# Patient Record
Sex: Female | Born: 1950
Health system: Southern US, Community
[De-identification: ages and names within clinical notes are randomized; demographics above are authoritative.]

## PROBLEM LIST (undated history)

## (undated) DIAGNOSIS — R011 Cardiac murmur, unspecified: Secondary | ICD-10-CM

## (undated) DIAGNOSIS — F419 Anxiety disorder, unspecified: Secondary | ICD-10-CM

## (undated) DIAGNOSIS — R229 Localized swelling, mass and lump, unspecified: Secondary | ICD-10-CM

## (undated) DIAGNOSIS — F32A Depression, unspecified: Secondary | ICD-10-CM

## (undated) DIAGNOSIS — M1990 Unspecified osteoarthritis, unspecified site: Secondary | ICD-10-CM

## (undated) DIAGNOSIS — R51 Headache: Secondary | ICD-10-CM

## (undated) DIAGNOSIS — J449 Chronic obstructive pulmonary disease, unspecified: Secondary | ICD-10-CM

## (undated) DIAGNOSIS — F329 Major depressive disorder, single episode, unspecified: Secondary | ICD-10-CM

## (undated) DIAGNOSIS — K449 Diaphragmatic hernia without obstruction or gangrene: Secondary | ICD-10-CM

## (undated) DIAGNOSIS — Z8639 Personal history of other endocrine, nutritional and metabolic disease: Secondary | ICD-10-CM

## (undated) DIAGNOSIS — I10 Essential (primary) hypertension: Secondary | ICD-10-CM

## (undated) DIAGNOSIS — H269 Unspecified cataract: Secondary | ICD-10-CM

## (undated) DIAGNOSIS — K5730 Diverticulosis of large intestine without perforation or abscess without bleeding: Secondary | ICD-10-CM

## (undated) DIAGNOSIS — T8859XA Other complications of anesthesia, initial encounter: Secondary | ICD-10-CM

## (undated) DIAGNOSIS — Z87448 Personal history of other diseases of urinary system: Secondary | ICD-10-CM

## (undated) DIAGNOSIS — R519 Headache, unspecified: Secondary | ICD-10-CM

## (undated) DIAGNOSIS — J40 Bronchitis, not specified as acute or chronic: Secondary | ICD-10-CM

## (undated) DIAGNOSIS — T4145XA Adverse effect of unspecified anesthetic, initial encounter: Secondary | ICD-10-CM

## (undated) DIAGNOSIS — Z973 Presence of spectacles and contact lenses: Secondary | ICD-10-CM

## (undated) DIAGNOSIS — E785 Hyperlipidemia, unspecified: Secondary | ICD-10-CM

## (undated) DIAGNOSIS — Z972 Presence of dental prosthetic device (complete) (partial): Secondary | ICD-10-CM

## (undated) HISTORY — DX: Bronchitis, not specified as acute or chronic: J40

## (undated) HISTORY — DX: Presence of spectacles and contact lenses: Z97.3

## (undated) HISTORY — DX: Headache: R51

## (undated) HISTORY — DX: Essential (primary) hypertension: I10

## (undated) HISTORY — DX: Major depressive disorder, single episode, unspecified: F32.9

## (undated) HISTORY — PX: LIPOMA EXCISION: SHX5283

## (undated) HISTORY — PX: HAMMER TOE SURGERY: SHX385

## (undated) HISTORY — PX: CATARACT EXTRACTION, BILATERAL: SHX1313

## (undated) HISTORY — PX: BREAST SURGERY: SHX581

## (undated) HISTORY — DX: Diaphragmatic hernia without obstruction or gangrene: K44.9

## (undated) HISTORY — PX: BREAST EXCISIONAL BIOPSY: SUR124

## (undated) HISTORY — PX: BUNIONECTOMY: SHX129

## (undated) HISTORY — PX: THYROID LOBECTOMY: SHX420

## (undated) HISTORY — DX: Unspecified cataract: H26.9

## (undated) HISTORY — DX: Personal history of other diseases of urinary system: Z87.448

## (undated) HISTORY — PX: KNEE ARTHROSCOPY: SUR90

## (undated) HISTORY — DX: Hyperlipidemia, unspecified: E78.5

## (undated) HISTORY — PX: PARTIAL KNEE ARTHROPLASTY: SHX2174

## (undated) HISTORY — DX: Anxiety disorder, unspecified: F41.9

## (undated) HISTORY — PX: HAND SURGERY: SHX662

## (undated) HISTORY — PX: UPPER GASTROINTESTINAL ENDOSCOPY: SHX188

## (undated) HISTORY — DX: Depression, unspecified: F32.A

## (undated) HISTORY — DX: Headache, unspecified: R51.9

---

## 1982-01-12 HISTORY — PX: PARTIAL HYSTERECTOMY: SHX80

## 1998-04-22 ENCOUNTER — Other Ambulatory Visit: Admission: RE | Admit: 1998-04-22 | Discharge: 1998-04-22 | Payer: Self-pay | Admitting: Obstetrics & Gynecology

## 1999-04-23 ENCOUNTER — Encounter: Admission: RE | Admit: 1999-04-23 | Discharge: 1999-04-23 | Payer: Self-pay | Admitting: Obstetrics and Gynecology

## 1999-04-23 ENCOUNTER — Encounter: Payer: Self-pay | Admitting: Obstetrics and Gynecology

## 1999-04-24 ENCOUNTER — Other Ambulatory Visit: Admission: RE | Admit: 1999-04-24 | Discharge: 1999-04-24 | Payer: Self-pay | Admitting: Obstetrics and Gynecology

## 2000-01-30 ENCOUNTER — Other Ambulatory Visit: Admission: RE | Admit: 2000-01-30 | Discharge: 2000-01-30 | Payer: Self-pay | Admitting: Family Medicine

## 2000-04-22 ENCOUNTER — Encounter: Payer: Self-pay | Admitting: Family Medicine

## 2000-04-22 ENCOUNTER — Encounter: Admission: RE | Admit: 2000-04-22 | Discharge: 2000-04-22 | Payer: Self-pay | Admitting: Family Medicine

## 2000-05-13 ENCOUNTER — Other Ambulatory Visit: Admission: RE | Admit: 2000-05-13 | Discharge: 2000-05-13 | Payer: Self-pay | Admitting: Obstetrics and Gynecology

## 2000-05-13 ENCOUNTER — Encounter: Admission: RE | Admit: 2000-05-13 | Discharge: 2000-05-13 | Payer: Self-pay | Admitting: Obstetrics and Gynecology

## 2000-05-13 ENCOUNTER — Encounter: Payer: Self-pay | Admitting: Obstetrics and Gynecology

## 2000-05-18 ENCOUNTER — Encounter: Payer: Self-pay | Admitting: Obstetrics and Gynecology

## 2000-05-18 ENCOUNTER — Encounter: Admission: RE | Admit: 2000-05-18 | Discharge: 2000-05-18 | Payer: Self-pay | Admitting: Family Medicine

## 2000-05-18 ENCOUNTER — Encounter: Admission: RE | Admit: 2000-05-18 | Discharge: 2000-05-18 | Payer: Self-pay | Admitting: Obstetrics and Gynecology

## 2000-05-18 ENCOUNTER — Encounter: Payer: Self-pay | Admitting: Family Medicine

## 2001-05-18 ENCOUNTER — Other Ambulatory Visit: Admission: RE | Admit: 2001-05-18 | Discharge: 2001-05-18 | Payer: Self-pay | Admitting: Obstetrics and Gynecology

## 2001-05-20 ENCOUNTER — Encounter: Admission: RE | Admit: 2001-05-20 | Discharge: 2001-05-20 | Payer: Self-pay | Admitting: Obstetrics and Gynecology

## 2001-05-20 ENCOUNTER — Encounter: Payer: Self-pay | Admitting: Obstetrics and Gynecology

## 2002-03-20 ENCOUNTER — Encounter: Payer: Self-pay | Admitting: Obstetrics and Gynecology

## 2002-03-20 ENCOUNTER — Encounter: Admission: RE | Admit: 2002-03-20 | Discharge: 2002-03-20 | Payer: Self-pay | Admitting: Obstetrics and Gynecology

## 2002-07-07 ENCOUNTER — Encounter: Admission: RE | Admit: 2002-07-07 | Discharge: 2002-07-07 | Payer: Self-pay | Admitting: Obstetrics and Gynecology

## 2002-07-07 ENCOUNTER — Encounter: Payer: Self-pay | Admitting: Obstetrics and Gynecology

## 2002-07-25 ENCOUNTER — Encounter: Payer: Self-pay | Admitting: Family Medicine

## 2002-07-25 ENCOUNTER — Encounter: Admission: RE | Admit: 2002-07-25 | Discharge: 2002-07-25 | Payer: Self-pay | Admitting: Family Medicine

## 2003-07-12 ENCOUNTER — Encounter: Admission: RE | Admit: 2003-07-12 | Discharge: 2003-07-12 | Payer: Self-pay | Admitting: Obstetrics and Gynecology

## 2004-07-14 ENCOUNTER — Ambulatory Visit: Payer: Self-pay | Admitting: Internal Medicine

## 2004-07-14 ENCOUNTER — Encounter: Admission: RE | Admit: 2004-07-14 | Discharge: 2004-07-14 | Payer: Self-pay | Admitting: Obstetrics and Gynecology

## 2004-07-30 ENCOUNTER — Encounter (INDEPENDENT_AMBULATORY_CARE_PROVIDER_SITE_OTHER): Payer: Self-pay | Admitting: *Deleted

## 2004-07-30 ENCOUNTER — Ambulatory Visit: Payer: Self-pay | Admitting: Internal Medicine

## 2004-11-25 ENCOUNTER — Ambulatory Visit (HOSPITAL_COMMUNITY): Admission: RE | Admit: 2004-11-25 | Discharge: 2004-11-25 | Payer: Self-pay | Admitting: Family Medicine

## 2005-01-22 ENCOUNTER — Encounter: Admission: RE | Admit: 2005-01-22 | Discharge: 2005-01-22 | Payer: Self-pay | Admitting: General Surgery

## 2005-09-09 ENCOUNTER — Encounter: Admission: RE | Admit: 2005-09-09 | Discharge: 2005-09-09 | Payer: Self-pay | Admitting: Obstetrics and Gynecology

## 2005-09-16 ENCOUNTER — Encounter: Admission: RE | Admit: 2005-09-16 | Discharge: 2005-09-16 | Payer: Self-pay | Admitting: Obstetrics and Gynecology

## 2005-12-09 ENCOUNTER — Ambulatory Visit: Payer: Self-pay | Admitting: Internal Medicine

## 2006-01-12 HISTORY — PX: ESOPHAGOGASTRODUODENOSCOPY: SHX1529

## 2006-01-26 ENCOUNTER — Ambulatory Visit: Payer: Self-pay | Admitting: Internal Medicine

## 2006-07-15 ENCOUNTER — Encounter: Admission: RE | Admit: 2006-07-15 | Discharge: 2006-07-15 | Payer: Self-pay | Admitting: Family Medicine

## 2006-10-04 ENCOUNTER — Encounter: Admission: RE | Admit: 2006-10-04 | Discharge: 2006-10-04 | Payer: Self-pay | Admitting: Obstetrics and Gynecology

## 2006-10-20 ENCOUNTER — Encounter: Admission: RE | Admit: 2006-10-20 | Discharge: 2006-10-20 | Payer: Self-pay | Admitting: Endocrinology

## 2007-05-18 ENCOUNTER — Telehealth: Payer: Self-pay | Admitting: Internal Medicine

## 2007-05-24 ENCOUNTER — Telehealth (INDEPENDENT_AMBULATORY_CARE_PROVIDER_SITE_OTHER): Payer: Self-pay | Admitting: *Deleted

## 2007-06-08 ENCOUNTER — Encounter: Payer: Self-pay | Admitting: Internal Medicine

## 2007-10-18 ENCOUNTER — Encounter: Admission: RE | Admit: 2007-10-18 | Discharge: 2007-10-18 | Payer: Self-pay | Admitting: Obstetrics and Gynecology

## 2008-11-01 ENCOUNTER — Encounter: Admission: RE | Admit: 2008-11-01 | Discharge: 2008-11-01 | Payer: Self-pay | Admitting: Obstetrics and Gynecology

## 2009-01-12 HISTORY — PX: COLONOSCOPY: SHX174

## 2009-06-24 ENCOUNTER — Encounter (INDEPENDENT_AMBULATORY_CARE_PROVIDER_SITE_OTHER): Payer: Self-pay | Admitting: *Deleted

## 2009-07-25 ENCOUNTER — Encounter (INDEPENDENT_AMBULATORY_CARE_PROVIDER_SITE_OTHER): Payer: Self-pay | Admitting: *Deleted

## 2009-07-30 ENCOUNTER — Ambulatory Visit: Payer: Self-pay | Admitting: Internal Medicine

## 2009-08-13 ENCOUNTER — Ambulatory Visit: Payer: Self-pay | Admitting: Internal Medicine

## 2009-11-29 ENCOUNTER — Encounter: Admission: RE | Admit: 2009-11-29 | Discharge: 2009-11-29 | Payer: Self-pay | Admitting: Family Medicine

## 2010-02-13 NOTE — Procedures (Signed)
Summary: Colonoscopy  Kimberly [name]: Kimberly Kimberly [name] Kimberly [name] Note: All result statuses are Final unless otherwise noted.  Tests: (1) Colonoscopy (COL)   COL Colonoscopy           DONE     [facility]     [address]     [place], [place]  [address]           COLONOSCOPY PROCEDURE REPORT           Kimberly [name]:  Kimberly Kimberly [name], Kimberly [name]  MR#:  [id number]     BIRTHDATE:  1950/08/19, 59 yrs. old  GENDER:  female     ENDOSCOPIST:  Wilhemina Bonito. Eda Keys, MD     REF. BY:  Surveillance Program Recall,     PROCEDURE DATE:  08/13/2009     PROCEDURE:  Surveillance Colonoscopy     ASA CLASS:  Class II     INDICATIONS:  history of hyperplastic polyps, family history of     colon cancer, surveillance and high-risk screening ; HP polyp     07-2004; GP w/ colon Ca     MEDICATIONS:   Fentanyl 100 mcg IV, Versed 10 mg IV           DESCRIPTION OF PROCEDURE:   After Kimberly Kimberly [name] risks benefits and     alternatives of Kimberly Kimberly [name] procedure were thoroughly explained, informed     consent was obtained.  Digital rectal exam was performed and     revealed no abnormalities.   Kimberly Kimberly [name] LB CF-H180AL [id number] endoscope     was introduced through Kimberly Kimberly [name] anus and advanced to Kimberly Kimberly [name] cecum, which     was identified by both Kimberly Kimberly [name] appendix and ileocecal valve, without     limitations.Time to cecum = 9:11 min.  Kimberly Kimberly [name] quality of Kimberly Kimberly [name] prep was     excellent, using MoviPrep.  Kimberly Kimberly [name] instrument was then slowly     withdrawn (time = 9:06 min) as Kimberly Kimberly [name] colon was fully examined.     <<PROCEDUREIMAGES>>           FINDINGS:  Kimberly Kimberly [name] rectosigmoid junction was fixed (presumably due to     adhesions) and was traversed with difficulty using Kimberly Kimberly [name] pediatric     scope. Threafter, A normal appearing cecum, ileocecal valve, and     appendiceal orifice were identified. Kimberly Kimberly [name] ascending, hepatic     flexure, transverse, splenic flexure, descending, sigmoid colon,     and rectum appeared unremarkable.  No polyps or cancers were seen.     Retroflexed views in Kimberly Kimberly [name] rectum revealed no abnormalities.    Kimberly Kimberly [name]   scope was then withdrawn from Kimberly Kimberly [name] Kimberly [name] and Kimberly Kimberly [name] procedure     completed.           COMPLICATIONS:  None     ENDOSCOPIC IMPRESSION:     1) Normal colonoscopy     2) No polyps or cancers     RECOMMENDATIONS:     1) Continue current colorectal screening recommendations for     "routine risk" patients with a repeat colonoscopy in 10 years.           ______________________________     Wilhemina Bonito. Eda Keys, MD           CC:  Lupita Raider, MD;Kathy Senaida Ores, MD;Kimberly Kimberly [name] Kimberly [name]           n.     Rosalie DoctorWilhemina Bonito. Eda Keys at 08/13/2009 09:43 AM           [employer], [id number]  Note: An exclamation  mark (!) indicates a result that was not dispersed into Kimberly Kimberly [name] flowsheet. Document Creation Date: 08/13/2009 9:44 AM _______________________________________________________________________  (1) Order result status: Final Collection or observation date-time: 08/13/2009 09:36 Requested date-time:  Receipt date-time:  Reported date-time:  Referring Physician:   Ordering Physician: Fransico Setters [phone]) Specimen Source:  Source: Launa Grill Order Number: [id number] Lab site:   Appended Document: Colonoscopy    Clinical Lists Changes  Observations: Added new observation of COLONNXTDUE: 08/2019 (08/13/2009 13:42)

## 2010-02-13 NOTE — Letter (Signed)
Summary: Moviprep Instructions  [facility]  [address]   [place], [place] [address]   Phone: [phone]  Fax: [phone]       Kimberly [name]    Jun 06, 1950    MRN: [id number]        Procedure Day Dorna Bloom: Tuesday, 08-13-09     Arrival Time: 7:30 a.m.     Procedure Time: 8:30 a.m.     Location of Procedure:                    x   [facility] (4th Floor)   PREPARATION FOR COLONOSCOPY WITH MOVIPREP   Starting 5 days prior to your procedure 08-08-09  do not eat nuts, seeds, popcorn, corn, beans, peas,  salads, or any raw vegetables.  Do not take any fiber supplements (e.g. Metamucil, Citrucel, and Benefiber).  THE DAY BEFORE YOUR PROCEDURE         DATE: 08-12-09   DAY: Monday  1.  Drink clear liquids the entire day-NO SOLID FOOD  2.  Do not drink anything colored red or purple.  Avoid juices with pulp.  No orange juice.  3.  Drink at least 64 oz. (8 glasses) of fluid/clear liquids during the day to prevent dehydration and help the prep work efficiently.  CLEAR LIQUIDS INCLUDE: Water Jello Ice Popsicles Tea (sugar ok, no milk/cream) Powdered fruit flavored drinks Coffee (sugar ok, no milk/cream) Gatorade Juice: apple, white grape, white cranberry  Lemonade Clear bullion, consomm, broth Carbonated beverages (any kind) Strained chicken noodle soup Hard Candy                             4.  In the morning, mix first dose of MoviPrep solution:    Empty 1 Pouch A and 1 Pouch B into the disposable container    Add lukewarm drinking water to the top line of the container. Mix to dissolve    Refrigerate (mixed solution should be used within 24 hrs)  5.  Begin drinking the prep at 5:00 p.m. The MoviPrep container is divided by 4 marks.   Every 15 minutes drink the solution down to the next mark (approximately 8 oz) until the full liter is complete.   6.  Follow completed prep with 16 oz of clear liquid of your choice (Nothing red or purple).   Continue to drink clear liquids until bedtime.  7.  Before going to bed, mix second dose of MoviPrep solution:    Empty 1 Pouch A and 1 Pouch B into the disposable container    Add lukewarm drinking water to the top line of the container. Mix to dissolve    Refrigerate  THE DAY OF YOUR PROCEDURE      DATE: 08-13-09 DAY: Tuesday  Beginning at 3:30 a.m. (5 hours before procedure):         1. Every 15 minutes, drink the solution down to the next mark (approx 8 oz) until the full liter is complete.  2. Follow completed prep with 16 oz. of clear liquid of your choice.    3. You may drink clear liquids until 6:30 a.m. (2 HOURS BEFORE PROCEDURE).   MEDICATION INSTRUCTIONS  Unless otherwise instructed, you should take regular prescription medications with a small sip of water   as early as possible the morning of your procedure.    Additional medication instructions: n/a         OTHER INSTRUCTIONS  You will need a responsible adult at least 60 years of age to accompany you and drive you home.   This person must remain in the waiting room during your procedure.  Wear loose fitting clothing that is easily removed.  Leave jewelry and other valuables at home.  However, you may wish to bring a book to read or  an iPod/MP3 player to listen to music as you wait for your procedure to start.  Remove all body piercing jewelry and leave at home.  Total time from sign-in until discharge is approximately 2-3 hours.  You should go home directly after your procedure and rest.  You can resume normal activities the  day after your procedure.  The day of your procedure you should not:   Drive   Make legal decisions   Operate machinery   Drink alcohol   Return to work  You will receive specific instructions about eating, activities and medications before you leave.    The above instructions have been reviewed and explained to me by  Sherren Kerns RN  July 30, 2009 8:38 AM       I fully understand and can verbalize these instructions _____________________________ Date _________

## 2010-02-13 NOTE — Letter (Signed)
Summary: Previsit letter  [facility]  [address] [place], [place] [address]   Phone: [phone]  Fax: [phone]       06/24/2009 MRN: [id number]  Kimberly [name] [address] [place], [place]  [address]  Dear Kimberly [name],  Welcome to the Gastroenterology Division at [facility].    You are scheduled to see a nurse for your pre-procedure visit on 07-30-09 at 8:30a.m. on the 3rd floor at [facility], [address]  We ask that you try to arrive at our office 15 minutes prior to your appointment time to allow for check-in.  Your nurse visit will consist of discussing your medical and surgical history, your immediate family medical history, and your medications.    Please bring a complete list of all your medications or, if you prefer, bring the medication bottles and we will list them.  We will need to be aware of both prescribed and over the counter drugs.  We will need to know exact dosage information as well.  If you are on blood thinners (Coumadin, Plavix, Aggrenox, Ticlid, etc.) please call our office today/prior to your appointment, as we need to consult with your physician about holding your medication.   Please be prepared to read and sign documents such as consent forms, a financial agreement, and acknowledgement forms.  If necessary, and with your consent, a friend or relative is welcome to sit-in on the nurse visit with you.  Please bring your insurance card so that we may make a copy of it.  If your insurance requires a referral to see a specialist, please bring your referral form from your primary care physician.  No co-pay is required for this nurse visit.     If you cannot keep your appointment, please call [phone] to cancel or reschedule prior to your appointment date.  This allows [place] the opportunity to schedule an appointment for another patient in need of care.    Thank you for choosing [facility] for your medical  needs.  We appreciate the opportunity to care for you.  Please visit [place] at our website  to learn more about our practice.                     Sincerely.                                                                                                                   The Gastroenterology Division

## 2010-02-13 NOTE — Miscellaneous (Signed)
Summary: previsit/rm  Clinical Lists Changes  Medications: Added new medication of MOVIPREP 100 GM  SOLR (PEG-KCL-NACL-NASULF-NA ASC-C) As per prep instructions. - Signed Rx of MOVIPREP 100 GM  SOLR (PEG-KCL-NACL-NASULF-NA ASC-C) As per prep instructions.;  #1 x 0;  Signed;  Entered by: Sherren Kerns RN;  Authorized by: Hilarie Fredrickson MD;  Method used: [facility]  [place] [address], [place], [place]  [address], Ph: [phone] or [phone], Fax: [phone] Allergies: Added new allergy or adverse reaction of PENICILLIN Added new allergy or adverse reaction of SULFA Observations: Added new observation of NKA: F (07/30/2009 8:19)    Prescriptions: MOVIPREP 100 GM  SOLR (PEG-KCL-NACL-NASULF-NA ASC-C) As per prep instructions.  #1 x 0   Entered by:   Sherren Kerns RN   Authorized by:   Hilarie Fredrickson MD   Signed by:   Sherren Kerns RN on 07/30/2009   Method used:   [facility]  [place] [address] (retail)       [address]       [place], [place]  [address]       Ph: [phone] or [phone]       Fax: [phone]   RxID:   [id number]

## 2010-05-30 NOTE — Assessment & Plan Note (Signed)
HEALTHCARE                         GASTROENTEROLOGY OFFICE NOTE   NAME:Kimberly [name], Kimberly [name]                        MRN:          [id number]  DATE:12/09/2005                            DOB:          [birth year]    REFERRING PHYSICIAN:  Donia Guiles, M.D.   REASON FOR CONSULTATION:  Multiple GI complaints.   HISTORY:  This is a pleasant 60 year old white female with a history of  hyperlipidemia and osteoporosis.  She is referred through the courtesy  of Dr. Arvilla Market regarding abdominal complaints.  The patient was seen  once on July 30, 2004, as a direct referral for screening colonoscopy.  Compete colonoscopy revealed a diminutive rectal polyp which was removed  and found to be hyperplastic.  She does have a family history of colon  cancer.  Follow up in 5 years recommended.  She has several complaints  today.  First, she was diagnosed with a cyst under her skin on the upper  portion of her abdomen which she says is tender to touch.  She  apparently has seen a Development worker, international aid.  A CT scan of the abdomen and  pelvis obtained in January revealed no significant abnormality.  Next,  she reports a greater than 1 year history of problems with increased  intestinal gas, bloating, indigestion, and heartburn.  She was placed on  Protonix for 30 days and did well.  Off medication, symptoms returned.  An additional 30 days of Protonix resulted in resolution of symptoms.  Off the medication, symptoms returned.  She denies dysphagia.  She does  have a remote history of peptic ulcer disease.  She has had an 11 pound  weight gain over the past year.  The final issue is that of  constipation.  She has had this for many years and takes laxatives  several times per week.  She did try MiraLax for 3 days and did not feel  this was helpful.   PAST MEDICAL HISTORY:  1. Osteoporosis.  2. Hyperlipidemia.  3. Anxiety.   PAST SURGICAL HISTORY:  1. Status post removal of  thyroid goiter.  2. Status post hemorrhoidectomy.  3. Status post hysterectomy (ovaries remain intact).  4. Status post tubal ligation.  5. Status post breast surgery on 3 occasions for benign disease.   ALLERGIES:  PENICILLIN, SULFA.   CURRENT MEDICATIONS:  1. Forteo 20 mcg daily.  2. Zetia 10 mg daily.  3. Aspirin 81 mg daily.  4. Os-Cal, multivitamin.   FAMILY HISTORY:  Paternal grandmother with colon cancer.  Father with  diabetes and heart disease.   SOCIAL HISTORY:  The patient is married with 2 daughters.  Lives with  her husband.  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  She smokes and occasionally uses alcohol.   REVIEW OF SYSTEMS:  Per diagnostic evaluation form.   PHYSICAL EXAMINATION:  Well appearing female in no acute distress.  Blood pressure is 122/74.  Heart rate is 56 and regular.  Weight is  123.2 pounds.  She is 4 feet 10 inches in height.  HEENT:  Sclerae are  anicteric, conjunctiva are pink, oral mucosa intact.  There is no adenopathy.  LUNGS:  Clear to auscultation and percussion.  HEART:  Regular.  ABDOMEN:  Soft, without tenderness, mass or hernia, good bowel sounds  heard.  There may be a small submucosal lesion such as a cyst in her  upper quadrant which she states is tender.However it is quite subtle.  EXTREMITIES:  No clubbing, cyanosis or edema.   IMPRESSION:  1. Gastroesophageal reflux disease.  2. Remote history of peptic ulcer disease.  3. Chronic constipation with negative colonoscopy last year.  4. Family history of colon cancer.  5. Presumed subcutaneous cyst which is tender.   RECOMMENDATIONS:  1. Schedule upper endoscopy to screen for Barrett's esophagus and      exclude other problems such as recurrent ulcer disease, to explain      her abdominal complaints.  2. Resume daily proton pump inhibitor therapy.  3. MiraLax daily for bowels.  I have instructed her on how to titrate      Miralax in order to  achieve desired effect.  4. Resume general medical care with Dr. Arvilla Market as previous.     Wilhemina Bonito. Marina Goodell, MD  Electronically Signed    JNP/MedQ  DD: 12/09/2005  DT: 12/09/2005  Job #: [id number]   cc:   Donia Guiles, M.D.

## 2010-06-30 ENCOUNTER — Encounter (INDEPENDENT_AMBULATORY_CARE_PROVIDER_SITE_OTHER): Payer: Self-pay | Admitting: Surgery

## 2010-07-08 ENCOUNTER — Ambulatory Visit (INDEPENDENT_AMBULATORY_CARE_PROVIDER_SITE_OTHER): Payer: BC Managed Care – PPO | Admitting: Surgery

## 2010-07-08 ENCOUNTER — Encounter (INDEPENDENT_AMBULATORY_CARE_PROVIDER_SITE_OTHER): Payer: Self-pay | Admitting: Surgery

## 2010-07-08 DIAGNOSIS — D1779 Benign lipomatous neoplasm of other sites: Secondary | ICD-10-CM

## 2010-07-08 DIAGNOSIS — D1720 Benign lipomatous neoplasm of skin and subcutaneous tissue of unspecified limb: Secondary | ICD-10-CM

## 2010-07-08 NOTE — Progress Notes (Signed)
Subjective:     Patient ID: Kimberly [name], female   DOB: [birth year], 60 y.o.   MRN: [id number]    There were no vitals taken for this visit.    HPI  Diagnosis: fibrolipomas of right arm, left abdomen, left inner thigh.  Procedure: Excision of masses.  Reason for visit: Followup  Patient the patient comes in today feeling better. She has some soreness in her larger left upper quadrant incision. However it is improving. She'll over as the point pains on her thigh and arm anymore. She denies any fevers chills sweats.  Review of Systems  Musculoskeletal: Negative for back pain, arthralgias and gait problem.  Skin: Negative for color change, pallor, rash and wound.  All other systems reviewed and are negative.       Objective:   Physical Exam  Constitutional: She appears well-developed and well-nourished. No distress.  HENT:  Head: Normocephalic.  Mouth/Throat: Oropharynx is clear and moist.  Eyes: Conjunctivae and EOM are normal. Pupils are equal, round, and reactive to light.  Cardiovascular: Normal rate and intact distal pulses.   Pulmonary/Chest: Effort normal. No respiratory distress.  Abdominal: Soft.  Musculoskeletal: Normal range of motion. She exhibits no edema.       Incisions 2-5cm on right forearm, abdomen, inner left thigh healed.  Mild ecchymosis LUQ abdominal incision.  No cellulitis.  Skin: She is not diaphoretic.       Assessment:     Fibrolipomas of the extremities and abdomen.   Plan:     Activity as tolerated.  Return to clinic p.r.n.  She expressed appreciation. At this point I think her risk of infection & recurrence is low. She does have other lesions elsewhere but they are small, not symptomatic and increasing. I would hold off on doing any more excisions unless it becomes one of these problems. She agreed.

## 2010-11-18 ENCOUNTER — Other Ambulatory Visit: Payer: Self-pay | Admitting: Family Medicine

## 2010-11-18 DIAGNOSIS — Z1231 Encounter for screening mammogram for malignant neoplasm of breast: Secondary | ICD-10-CM

## 2010-12-05 ENCOUNTER — Ambulatory Visit: Payer: Self-pay

## 2010-12-05 ENCOUNTER — Ambulatory Visit
Admission: RE | Admit: 2010-12-05 | Discharge: 2010-12-05 | Disposition: A | Discharge status: Home / Self Care | Payer: BC Managed Care – PPO | Source: Ambulatory Visit | Attending: Family Medicine | Admitting: Family Medicine

## 2010-12-05 DIAGNOSIS — Z1231 Encounter for screening mammogram for malignant neoplasm of breast: Secondary | ICD-10-CM

## 2011-09-01 ENCOUNTER — Ambulatory Visit: Payer: BC Managed Care – PPO | Attending: Specialist | Admitting: Physical Therapy

## 2011-09-01 DIAGNOSIS — IMO0001 Reserved for inherently not codable concepts without codable children: Secondary | ICD-10-CM | POA: Insufficient documentation

## 2011-09-01 DIAGNOSIS — M25519 Pain in unspecified shoulder: Secondary | ICD-10-CM | POA: Insufficient documentation

## 2011-09-01 DIAGNOSIS — R5381 Other malaise: Secondary | ICD-10-CM | POA: Insufficient documentation

## 2011-11-05 ENCOUNTER — Other Ambulatory Visit: Payer: Self-pay | Admitting: Family Medicine

## 2011-11-05 DIAGNOSIS — Z1231 Encounter for screening mammogram for malignant neoplasm of breast: Secondary | ICD-10-CM

## 2011-12-07 ENCOUNTER — Ambulatory Visit
Admission: RE | Admit: 2011-12-07 | Discharge: 2011-12-07 | Disposition: A | Discharge status: Home / Self Care | Payer: BC Managed Care – PPO | Source: Ambulatory Visit | Attending: Family Medicine | Admitting: Family Medicine

## 2011-12-07 DIAGNOSIS — Z1231 Encounter for screening mammogram for malignant neoplasm of breast: Secondary | ICD-10-CM

## 2012-04-22 ENCOUNTER — Other Ambulatory Visit: Payer: Self-pay | Admitting: Family Medicine

## 2012-04-22 DIAGNOSIS — R809 Proteinuria, unspecified: Secondary | ICD-10-CM

## 2012-04-26 ENCOUNTER — Other Ambulatory Visit: Payer: BC Managed Care – PPO

## 2012-09-07 ENCOUNTER — Ambulatory Visit
Admission: RE | Admit: 2012-09-07 | Discharge: 2012-09-07 | Disposition: A | Discharge status: Home / Self Care | Payer: BC Managed Care – PPO | Source: Ambulatory Visit | Attending: Family Medicine | Admitting: Family Medicine

## 2012-09-07 DIAGNOSIS — R809 Proteinuria, unspecified: Secondary | ICD-10-CM

## 2012-10-18 ENCOUNTER — Ambulatory Visit (INDEPENDENT_AMBULATORY_CARE_PROVIDER_SITE_OTHER): Payer: BC Managed Care – PPO

## 2012-10-18 VITALS — BP 127/78 | HR 87 | Resp 16 | Ht 58.5 in | Wt 124.0 lb

## 2012-10-18 DIAGNOSIS — M2010 Hallux valgus (acquired), unspecified foot: Secondary | ICD-10-CM

## 2012-10-18 DIAGNOSIS — M2040 Other hammer toe(s) (acquired), unspecified foot: Secondary | ICD-10-CM

## 2012-10-18 NOTE — Progress Notes (Signed)
  Subjective:    Patient ID: Kimberly [name], female    DOB: 09/22/1950, 62 y.o.   MRN: [id number]  HPI patient presents 15 days status post Eliberto Ivory bunionectomy arthrostomy uncinate on right foot as well as hammertoe repair fourth and fifth toes right with pin fixation fourth. Patient doing with cam boot or air fracture walker. Only complaint of some lower calf pain and right side or cramping when sleeping. Exam reveals negative Homans sign no palpable cords or nodules normal temperature.   Review of Systems  Constitutional: Negative.   HENT: Negative.   Eyes: Negative.   Respiratory: Negative.   Cardiovascular: Negative.   Gastrointestinal: Negative.   Endocrine: Negative.   Genitourinary: Negative.   Musculoskeletal: Positive for back pain.  Skin: Negative.   Allergic/Immunologic: Negative.   Neurological: Positive for tremors.  Hematological: Bruises/bleeds easily.  Psychiatric/Behavioral: Positive for sleep disturbance.       Objective:   Physical Exam  Constitutional: She appears well-developed and well-nourished.  Cardiovascular: Intact distal pulses.   Capillary refill timed 3-4 seconds all digits there is pin fixation intact fourth toe right foot incision is well coapted both for the bunion the bunion and hammertoes fourth and fifth right foot. Mild ecchymosis consistent with postop course noted.  patient does have complaint of some calf pain on the right leg describing more is cramping especially at night her patient may be somewhat dehydrated. No palpable cords or nodules are noted at this time no pain in Compression. No increased temperature on palpation of the right calf. Patient is advised to maintain the air fracture boot she may remove it for bathing and showering, and for range of motion exercises multiple times throughout the day. Patient will also maintain Advil as needed for pain and, 81 mg aspirin daily.  Musculoskeletal: Normal range of motion.  Neurological: She is  alert. She has normal strength and normal reflexes. No sensory deficit.  Skin: Skin is warm and dry. No cyanosis. Nails show no clubbing.  Incisions right foot well coapted no dehiscence no discharge or drainage  Psychiatric: She has a normal mood and affect. Her behavior is normal. Judgment and thought content normal.          Assessment & Plan:  Good postop progress noted at this time sutures removed fourth and fifth toes right foot and placement intact. Coflex wrapping is applied to fourth and fifth toes right foot in a buddy splint fashioned. Dispensed several roles for patient to maintain Coflex wrap. Patient also placed an Ace anklet for compression. Maintain aspirin 81 mg daily and Advil as needed for pain. Maintain her fracture walker, may remove multiple times daily for her ankle and foot exercise and for bathing as needed/showers. Return office visit 3 weeks for postop followup x-rays and plan for pin removal  Alvan Dame DPM

## 2012-10-18 NOTE — Patient Instructions (Signed)
Incision Care  An incision is when a surgeon cuts into your body tissues. After surgery, the incision needs to be cared for properly to prevent infection.   HOME CARE INSTRUCTIONS    Take all medicine as directed by your caregiver. Only take over-the-counter or prescription medicines for pain, discomfort, or fever as directed by your caregiver.   Do not remove your bandage (dressing) or get your incision wet until your surgeon gives you permission. In the event that your dressing becomes wet, dirty, or starts to smell, change the dressing and call your surgeon for instructions as soon as possible.   Take showers. Do not take tub baths, swim, or do anything that may soak the wound until it is healed.   Resume your normal diet and activities as directed or allowed.   Avoid lifting any weight until you are instructed otherwise.   Use anti-itch antihistamine medicine as directed by your caregiver. The wound may itch when it is healing. Do not pick or scratch at the wound.   Follow up with your caregiver for stitch (suture) or staple removal as directed.   Drink enough fluids to keep your urine clear or pale yellow.  SEEK MEDICAL CARE IF:    You have redness, swelling, or increasing pain in the wound that is not controlled with medicine.   You have drainage, blood, or pus coming from the wound that lasts longer than 1 day.   You develop muscle aches, chills, or a general ill feeling.   You notice a bad smell coming from the wound or dressing.   Your wound edges separate after the sutures, staples, or skin adhesive strips have been removed.   You develop persistent nausea or vomiting.  SEEK IMMEDIATE MEDICAL CARE IF:    You have a fever.   You develop a rash.   You develop dizzy episodes or faint while standing.   You have difficulty breathing.   You develop any reaction or side effects to medicine given.  MAKE SURE YOU:    Understand these instructions.   Will watch your condition.   Will get help  right away if you are not doing well or get worse.  Document Released: 07/18/2004 Document Revised: 03/23/2011 Document Reviewed: 05/04/2010  [employer] Patient Information 2014 [employer].

## 2012-10-19 ENCOUNTER — Telehealth: Payer: Self-pay | Admitting: *Deleted

## 2012-10-19 NOTE — Telephone Encounter (Signed)
Pt states that the elastic sleeve that Dr Ralene Cork gave her 10/18/12 is causing swelling.  Return call 315pm I instructed pt to be certain to put the elastic sleeve on 1st thing in the morning and to shower at night.  I informed pt the purpose of the elastic sleeve is to hold the swelling out of the surgical foot, and swelling begins when the foot goes below the heart and heat or overuse can cause swelling.  Encouraged pt to call with concerns.

## 2012-11-03 ENCOUNTER — Telehealth: Payer: Self-pay | Admitting: *Deleted

## 2012-11-03 NOTE — Telephone Encounter (Signed)
Pt states is scheduled to come in 11/09/2012 for pin removal from hammer toe surgery.  Pt asked if could come in sooner.  Returned call informed pt that Dr Ralene Cork could possibly see her 1 day prior, but would be best to leave pin as long as possible.  Pt asked to schedule 1 day earlier, I transferred to a scheduler.

## 2012-11-09 ENCOUNTER — Ambulatory Visit (INDEPENDENT_AMBULATORY_CARE_PROVIDER_SITE_OTHER): Payer: BC Managed Care – PPO

## 2012-11-09 VITALS — BP 120/72 | HR 121 | Resp 16 | Ht 58.5 in | Wt 124.0 lb

## 2012-11-09 DIAGNOSIS — M2040 Other hammer toe(s) (acquired), unspecified foot: Secondary | ICD-10-CM

## 2012-11-09 DIAGNOSIS — M2010 Hallux valgus (acquired), unspecified foot: Secondary | ICD-10-CM

## 2012-11-09 DIAGNOSIS — Z09 Encounter for follow-up examination after completed treatment for conditions other than malignant neoplasm: Secondary | ICD-10-CM

## 2012-11-09 NOTE — Progress Notes (Signed)
  Subjective:    Patient ID: Kimberly [name], female    DOB: May 07, 1950, 62 y.o.   MRN: [id number] "It's doing pretty good."   Patient proxy 1 month status post [place] bunionectomy right foot and hammertoe repair fourth and fifth right. Intact pin fixation fourth toe.  HPI no changes medication her health history noted.  Review of Systems deferred at this time to     Objective:   Physical Exam  neurovascular status is intact. Pedal pulses palpable. Epicritic and proprioceptive sensations intact. Incisions are well coapted x3. At this time the x-ray revealed good consolidation of the first metatarsal osteotomy as well as the fourth and fifth digits. The pin is removed with minimal discomfort Neosporin and Coflex wrap was applied patient will maintain Coflex wrap into the digits 4 and 5 right foot       Assessment & Plan:  Good postop progress status post right foot surgery inactivity and hammertoe repairs. Plan at this time followup in 2 months for long-term reevaluation and x-rays. Discontinue air fracture boot may resume couple walking tennis or athletic shoes. Maintain Coflex wrapping and anklet for compression. Use lotion are cocoa butter on incisions as instructed. Continued active passive range of motion exercises first MTP joint. There is approximately 30 to 40 dorsiflexion available at this time. Incision well coapted patient left ambulating in a couple walking tennis shoe with instructions for followup in 2 months  Alvan Dame DPM

## 2012-11-09 NOTE — Patient Instructions (Signed)

## 2012-12-06 ENCOUNTER — Other Ambulatory Visit: Payer: Self-pay

## 2012-12-06 DIAGNOSIS — Z1231 Encounter for screening mammogram for malignant neoplasm of breast: Secondary | ICD-10-CM

## 2012-12-30 ENCOUNTER — Ambulatory Visit (INDEPENDENT_AMBULATORY_CARE_PROVIDER_SITE_OTHER): Payer: BC Managed Care – PPO

## 2012-12-30 ENCOUNTER — Ambulatory Visit: Payer: BC Managed Care – PPO

## 2012-12-30 VITALS — BP 107/61 | HR 127 | Resp 18

## 2012-12-30 DIAGNOSIS — M79609 Pain in unspecified limb: Secondary | ICD-10-CM

## 2012-12-30 DIAGNOSIS — M2010 Hallux valgus (acquired), unspecified foot: Secondary | ICD-10-CM

## 2012-12-30 DIAGNOSIS — Z09 Encounter for follow-up examination after completed treatment for conditions other than malignant neoplasm: Secondary | ICD-10-CM

## 2012-12-30 DIAGNOSIS — M2040 Other hammer toe(s) (acquired), unspecified foot: Secondary | ICD-10-CM

## 2012-12-30 NOTE — Progress Notes (Signed)
   Subjective:    Patient ID: Kimberly [name], female    DOB: 18-Mar-1950, 62 y.o.   MRN: [id number]  HPI THE FOURTH TOE IS ACTING FUNNY AND WANTS TO LAP OVER AND MY FOOT STILL HURTS ON MY RIGHT FOOT    Review of Systems no changes     Objective:   Physical Exam Neurovascular status is intact pedal pulses palpable. Incision well coapted. X-rays reveal good alignment of the osteotomy and pin fixation first metatarsal right foot. There is good range of motion proxy 40 dorsiflexion 5 plantar flexion. Still some slight edema consistent with postop course. Do may last for 3-6 months following surgery. At this time patient having some slight adductovarus rotation of the fourth digit the distal tuft so in a varus position although not painful or symptomatic there is some swelling still present. X-rays confirm a slight adductovarus retained despite surgical correction the digit appears to be fibrosed clinically stable no overlapping or and overlapping is noted       Assessment & Plan:  Assessment good postop progress still some mild residual postoperative swelling present in the the next 3-6 months. Maintain appropriate coming shoes at all times continued Coflex buddy wrap in the fourth digit to the third and fifth also ice whenever possible. Contact is any symptomology recurs or persists on the fourth toe. Otherwise good postop progress noted discharge to an as-needed basis  Alvan Dame DPM

## 2012-12-30 NOTE — Patient Instructions (Signed)
ICE INSTRUCTIONS  Apply ice or cold pack to the affected area at least 3 times a day for 10-15 minutes each time.  You should also use ice after prolonged activity or vigorous exercise.  Do not apply ice longer than 20 minutes at one time.  Always keep a cloth between your skin and the ice pack to prevent burns.  Being consistent and following these instructions will help control your symptoms.  We suggest you purchase a gel ice pack because they are reusable and do bit leak.  Some of them are designed to wrap around the area.  Use the method that works best for you.  Here are some other suggestions for icing.   Use a frozen bag of peas or corn-inexpensive and molds well to your body, usually stays frozen for 10 to 20 minutes.  Wet a towel with cold water and squeeze out the excess until it's damp.  Place in a bag in the freezer for 20 minutes. Then remove and use    Continue use the Coflex wrap in the keep the toes were swelling buddy wrap the toes 34 and 5 together when possible.

## 2013-01-26 ENCOUNTER — Ambulatory Visit
Admission: RE | Admit: 2013-01-26 | Discharge: 2013-01-26 | Disposition: A | Discharge status: Home / Self Care | Payer: 59 | Source: Ambulatory Visit

## 2013-01-26 DIAGNOSIS — Z1231 Encounter for screening mammogram for malignant neoplasm of breast: Secondary | ICD-10-CM

## 2013-04-05 DIAGNOSIS — M2040 Other hammer toe(s) (acquired), unspecified foot: Secondary | ICD-10-CM

## 2013-09-06 ENCOUNTER — Ambulatory Visit (INDEPENDENT_AMBULATORY_CARE_PROVIDER_SITE_OTHER): Payer: 59 | Admitting: Surgery

## 2013-09-06 ENCOUNTER — Encounter (INDEPENDENT_AMBULATORY_CARE_PROVIDER_SITE_OTHER): Payer: Self-pay | Admitting: Surgery

## 2013-09-06 VITALS — BP 126/70 | HR 72 | Temp 97.5°F | Ht <= 58 in | Wt 114.0 lb

## 2013-09-06 DIAGNOSIS — D1779 Benign lipomatous neoplasm of other sites: Secondary | ICD-10-CM

## 2013-09-06 DIAGNOSIS — K5900 Constipation, unspecified: Secondary | ICD-10-CM

## 2013-09-06 DIAGNOSIS — Z720 Tobacco use: Secondary | ICD-10-CM

## 2013-09-06 DIAGNOSIS — K5909 Other constipation: Secondary | ICD-10-CM

## 2013-09-06 DIAGNOSIS — R220 Localized swelling, mass and lump, head: Secondary | ICD-10-CM

## 2013-09-06 DIAGNOSIS — D1720 Benign lipomatous neoplasm of skin and subcutaneous tissue of unspecified limb: Secondary | ICD-10-CM

## 2013-09-06 DIAGNOSIS — R221 Localized swelling, mass and lump, neck: Secondary | ICD-10-CM

## 2013-09-06 DIAGNOSIS — F17200 Nicotine dependence, unspecified, uncomplicated: Secondary | ICD-10-CM

## 2013-09-06 NOTE — Progress Notes (Signed)
Subjective:     Patient ID: Kimberly [name], female   DOB: 1950-08-24, 63 y.o.   MRN: [id number]  HPI  Note: Portions of this report may have been transcribed using voice recognition software. Every effort was made to ensure accuracy; however, inadvertent computerized transcription errors may be present.   Any transcriptional errors that result from this process are unintentional.            Kimberly [name]  [birth year] [id number]  Patient Care Team: Mayra Neer, MD as PCP - General (Family Medicine) Harriet Masson, DPM as Consulting Physician (Podiatry)  This patient is a 63 y.o.female who presents today for surgical evaluation.   Reason for visit: Enlarging sensitive posterior neck mass.  Pleasant woman with history of numerous benign breast masses and lipomas that required excision inspiratory over the past several decades.  I last saw her several years ago to remove some off of her extremities.  Recovered.  She has noted in the past 2 years she has a mass enlarging on her posterior neck.  Gets tender.  He gets headaches.  Sometimes pain radiates down her neck to her left shoulder.  Had workup negative for any spinal disease.  Wish to have the area removed before it gets worse.  She does smoke a pack a day.  Some issues with a little bruising but not on blood thinners.  No history of cardiac disease.  Otherwise very active.  No complications or wound problems with prior numerous excisions.  Patient Active Problem List   Diagnosis Date Noted  . Lipoma of right forearm, left abdominal wall, left inner thigh 07/08/2010    Past Medical History  Diagnosis Date  . Hyperlipemia   . Hypertension   . Bronchitis   . Hiatal hernia   . History of blood in urine   . Generalized headaches   . Wears glasses   . Osteoporosis     Past Surgical History  Procedure Laterality Date  . Thyroid lobectomy  1970's  . Appendectomy  1970's  . Partial hysterectomy  1984  . Breast surgery     x3 fibrocystic disease, radial scarring  . Bunionectomy Right   . Hammer toe surgery Right     4,5    History   Social History  . Marital Status: Married    Spouse Name: N/A    Number of Children: N/A  . Years of Education: N/A   Occupational History  . Not on file.   Social History Main Topics  . Smoking status: Current Every Day Smoker -- 1.00 packs/day    Types: Cigarettes  . Smokeless tobacco: Not on file  . Alcohol Use: 0.0 oz/week     Comment: socially, once in a while  . Drug Use: No  . Sexual Activity: Not on file   Other Topics Concern  . Not on file   Social History Narrative  . No narrative on file    Family History  Problem Relation Age of Onset  . Heart disease Father   . Heart disease Sister     cardiac arrest  . Cancer Brother     lymphoma    Current Outpatient Prescriptions  Medication Sig Dispense Refill  . aspirin 81 MG tablet Take 81 mg by mouth daily.        . Calcium Carbonate (CALCIUM 500 PO) Take 500 mg by mouth 2 (two) times daily.        . Cholecalciferol (VITAMIN D) 1000 UNITS capsule Take  1,000 Units by mouth 2 (two) times daily.        . fish oil-omega-3 fatty acids 1000 MG capsule Take 2 g by mouth 2 (two) times daily.        Marland Kitchen lisinopril (PRINIVIL,ZESTRIL) 5 MG tablet Take 5 mg by mouth daily.        . Multiple Vitamins-Minerals (MEGA MULTI WOMEN PO) Take by mouth 2 (two) times daily.        Marland Kitchen venlafaxine (EFFEXOR) 75 MG tablet Take 75 mg by mouth daily.         No current facility-administered medications for this visit.     Allergies  Allergen Reactions  . Oxycodone Itching  . Penicillins     REACTION: rash  . Sulfonamide Derivatives     REACTION: rash    BP 126/70  Pulse 72  Temp(Src) 97.5 F (36.4 C)  Ht 4\' 10"  (1.473 m)  Wt 114 lb (51.71 kg)  BMI 23.83 kg/m2  No results found.   Review of Systems  Constitutional: Negative for fever, chills, diaphoresis, appetite change and fatigue.  HENT: Negative for ear  discharge, ear pain, sore throat and trouble swallowing.   Eyes: Negative for photophobia, discharge and visual disturbance.  Respiratory: Negative for cough, choking, chest tightness and shortness of breath.   Cardiovascular: Negative for chest pain and palpitations.  Gastrointestinal: Positive for constipation. Negative for nausea, vomiting, abdominal pain, diarrhea, anal bleeding and rectal pain.  Endocrine: Negative for cold intolerance and heat intolerance.  Genitourinary: Positive for hematuria. Negative for dysuria, frequency and difficulty urinating.  Musculoskeletal: Positive for neck pain. Negative for arthralgias, back pain, gait problem, myalgias and neck stiffness.  Skin: Negative for color change, pallor and rash.  Allergic/Immunologic: Negative for environmental allergies, food allergies and immunocompromised state.  Neurological: Negative for dizziness, speech difficulty, weakness and numbness.  Hematological: Negative for adenopathy. Bruises/bleeds easily.  Psychiatric/Behavioral: Negative for confusion and agitation. The patient is not nervous/anxious.        Objective:   Physical Exam  Constitutional: She is oriented to person, place, and time. She appears well-developed and well-nourished. No distress.  HENT:  Head: Normocephalic.  Mouth/Throat: Oropharynx is clear and moist. No oropharyngeal exudate.  Eyes: Conjunctivae and EOM are normal. Pupils are equal, round, and reactive to light. No scleral icterus.  Neck: Trachea normal and normal range of motion. Normal carotid pulses and no JVD present. Muscular tenderness present. No spinous process tenderness present. Carotid bruit is not present. No tracheal deviation, no edema, no erythema and normal range of motion present. No mass present.    Cardiovascular: Normal rate, regular rhythm and intact distal pulses.   Pulmonary/Chest: Effort normal and breath sounds normal. No stridor. No respiratory distress. She exhibits  no tenderness.  Abdominal: Soft. She exhibits no distension and no mass. There is no tenderness. Hernia confirmed negative in the right inguinal area and confirmed negative in the left inguinal area.  Genitourinary: No vaginal discharge found.  Musculoskeletal: Normal range of motion. She exhibits no tenderness.       Right shoulder: Normal. She exhibits normal range of motion, no tenderness, no deformity and no laceration.       Left shoulder: Normal. She exhibits normal range of motion, no tenderness, no bony tenderness, no deformity, no laceration and normal strength.       Right elbow: She exhibits normal range of motion.       Left elbow: She exhibits normal range of motion.  Right wrist: She exhibits normal range of motion.       Left wrist: She exhibits normal range of motion.       Right hand: Normal strength noted.       Left hand: Normal strength noted.  Lymphadenopathy:       Head (right side): No posterior auricular adenopathy present.       Head (left side): No posterior auricular adenopathy present.    She has no cervical adenopathy.    She has no axillary adenopathy.       Right: No inguinal adenopathy present.       Left: No inguinal adenopathy present.  Neurological: She is alert and oriented to person, place, and time. No cranial nerve deficit. She exhibits normal muscle tone. Coordination normal.  Skin: Skin is warm and dry. No rash noted. She is not diaphoretic. No erythema.  Psychiatric: She has a normal mood and affect. Her behavior is normal. Judgment and thought content normal.       Assessment:     Enlarging tender posterior neck mass in woman with history of numerous lipomas      Plan:     I think she would benefit from resection of the mass since it is becoming larger and more sensitive.  Most likely a lipoma.  Given the location, would need at least deep sedation if not general anesthesia.  Drain not to likely.  She is interested in proceeding:  The  pathophysiology of skin & subcutaneous masses was discussed.  Natural history risks without surgery were discussed.  I recommended surgery to remove the mass.  I explained the technique of removal with use of local anesthesia & possible need for more aggressive sedation/anesthesia for patient comfort.    Risks such as bleeding, infection, heart attack, death, and other risks were discussed.  I noted a good likelihood this will help address the problem.   Possibility that this will not correct all symptoms was explained. Possibility of regrowth/recurrence of the mass was discussed.  We will work to minimize complications. Questions were answered.  The patient expresses understanding & wishes to proceed with surgery.   I recommend that she STOP SMOKING! We talked to the patient about the dangers of smoking.  We stressed that tobacco use dramatically increases the risk of peri-operative complications such as infection, tissue necrosis leaving to problems with incision/wound and organ healing, hernia, chronic pain, heart attack, stroke, DVT, pulmonary embolism, and death.  We noted there are programs in our community to help stop smoking.  Information was available.

## 2013-09-06 NOTE — Patient Instructions (Addendum)
Please consider the recommendations that we have given you today:  Consider surgery to remove the tender, enlarging mass on the back of your neck.  Most likely a lipoma or cyst.  Stop smoking.  See the Handout(s) we have given you.  Please call our office at [phone] if you wish to schedule surgery or if you have further questions / concerns.   Lipoma A lipoma is a noncancerous (benign) tumor composed of fat cells. They are usually found under the skin (subcutaneous). A lipoma may occur in any tissue of the body that contains fat. Common areas for lipomas to appear include the back, shoulders, buttocks, and thighs. Lipomas are a very common soft tissue growth. They are soft and grow slowly. Most problems caused by a lipoma depend on where it is growing. DIAGNOSIS  A lipoma can be diagnosed with a physical exam. These tumors rarely become cancerous, but radiographic studies can help determine this for certain. Studies used may include:  Computerized X-ray scans (CT or CAT scan).  Computerized magnetic scans (MRI). TREATMENT  Small lipomas that are not causing problems may be watched. If a lipoma continues to enlarge or causes problems, removal is often the best treatment. Lipomas can also be removed to improve appearance. Surgery is done to remove the fatty cells and the surrounding capsule. Most often, this is done with medicine that numbs the area (local anesthetic). The removed tissue is examined under a microscope to make sure it is not cancerous. Keep all follow-up appointments with your caregiver. SEEK MEDICAL CARE IF:   The lipoma becomes larger or hard.  The lipoma becomes painful, red, or increasingly swollen. These could be signs of infection or a more serious condition. Document Released: 12/19/2001 Document Revised: 03/23/2011 Document Reviewed: 05/31/2009 [facility] Patient Information 2015 [place], [place]. This information is not intended to replace advice given to you by  your health care provider. Make sure you discuss any questions you have with your health care provider.  STOP SMOKING!  We strongly recommend that you stop smoking.  Smoking increases the risk of surgery including infection in the form of an open wound, pus formation, abscess, hernia at an incision on the abdomen, etc.  You have an increased risk of other MAJOR complications such as stroke, heart attack, forming clots in the leg and/or lungs, and death.    Smoking Cessation Quitting smoking is important to your health and has many advantages. However, it is not always easy to quit since nicotine is a very addictive drug. Often times, people try 3 times or more before being able to quit. This document explains the best ways for you to prepare to quit smoking. Quitting takes hard work and a lot of effort, but you can do it. ADVANTAGES OF QUITTING SMOKING  You will live longer, feel better, and live better.  Your body will feel the impact of quitting smoking almost immediately.  Within 20 minutes, blood pressure decreases. Your pulse returns to its normal level.  After 8 hours, carbon monoxide levels in the blood return to normal. Your oxygen level increases.  After 24 hours, the chance of having a heart attack starts to decrease. Your breath, hair, and body stop smelling like smoke.  After 48 hours, damaged nerve endings begin to recover. Your sense of taste and smell improve.  After 72 hours, the body is virtually free of nicotine. Your bronchial tubes relax and breathing becomes easier.  After 2 to 12 weeks, lungs can hold more air.  Exercise becomes easier and circulation improves.  The risk of having a heart attack, stroke, cancer, or lung disease is greatly reduced.  After 1 year, the risk of coronary heart disease is cut in half.  After 5 years, the risk of stroke falls to the same as a nonsmoker.  After 10 years, the risk of lung cancer is cut in half and the risk of other cancers  decreases significantly.  After 15 years, the risk of coronary heart disease drops, usually to the level of a nonsmoker.  If you are pregnant, quitting smoking will improve your chances of having a healthy baby.  The people you live with, especially any children, will be healthier.  You will have extra money to spend on things other than cigarettes. QUESTIONS TO THINK ABOUT BEFORE ATTEMPTING TO QUIT You may want to talk about your answers with your caregiver.  Why do you want to quit?  If you tried to quit in the past, what helped and what did not?  What will be the most difficult situations for you after you quit? How will you plan to handle them?  Who can help you through the tough times? Your family? Friends? A caregiver?  What pleasures do you get from smoking? What ways can you still get pleasure if you quit? Here are some questions to ask your caregiver:  How can you help me to be successful at quitting?  What medicine do you think would be best for me and how should I take it?  What should I do if I need more help?  What is smoking withdrawal like? How can I get information on withdrawal? GET READY  Set a quit date.  Change your environment by getting rid of all cigarettes, ashtrays, matches, and lighters in your home, car, or work. Do not let people smoke in your home.  Review your past attempts to quit. Think about what worked and what did not. GET SUPPORT AND ENCOURAGEMENT You have a better chance of being successful if you have help. You can get support in many ways.  Tell your family, friends, and co-workers that you are going to quit and need their support. Ask them not to smoke around you.  Get individual, group, or telephone counseling and support. Programs are available at [employer] and health centers. Call your local health department for information about programs in your area.  Spiritual beliefs and practices may help some smokers quit.  Download  a "quit meter" on your computer to keep track of quit statistics, such as how long you have gone without smoking, cigarettes not smoked, and money saved.  Get a self-help book about quitting smoking and staying off of tobacco. [place] yourself from urges to smoke. Talk to someone, go for a walk, or occupy your time with a task.  Change your normal routine. Take a different route to work. Drink tea instead of coffee. Eat breakfast in a different place.  Reduce your stress. Take a hot bath, exercise, or read a book.  Plan something enjoyable to do every day. Reward yourself for not smoking.  Explore interactive web-based programs that specialize in helping you quit. GET MEDICINE AND USE IT CORRECTLY Medicines can help you stop smoking and decrease the urge to smoke. Combining medicine with the above behavioral methods and support can greatly increase your chances of successfully quitting smoking.  Nicotine replacement therapy helps deliver nicotine to your body without the negative effects and  risks of smoking. Nicotine replacement therapy includes nicotine gum, lozenges, inhalers, nasal sprays, and skin patches. Some may be available over-the-counter and others require a prescription.  Antidepressant medicine helps people abstain from smoking, but how this works is unknown. This medicine is available by prescription.  Nicotinic receptor partial agonist medicine simulates the effect of nicotine in your brain. This medicine is available by prescription. Ask your caregiver for advice about which medicines to use and how to use them based on your health history. Your caregiver will tell you what side effects to look out for if you choose to be on a medicine or therapy. Carefully read the information on the package. Do not use any other product containing nicotine while using a nicotine replacement product.  RELAPSE OR DIFFICULT SITUATIONS Most relapses occur within  the first 3 months after quitting. Do not be discouraged if you start smoking again. Remember, most people try several times before finally quitting. You may have symptoms of withdrawal because your body is used to nicotine. You may crave cigarettes, be irritable, feel very hungry, cough often, get headaches, or have difficulty concentrating. The withdrawal symptoms are only temporary. They are strongest when you first quit, but they will go away within 10 14 days. To reduce the chances of relapse, try to:  Avoid drinking alcohol. Drinking lowers your chances of successfully quitting.  Reduce the amount of caffeine you consume. Once you quit smoking, the amount of caffeine in your body increases and can give you symptoms, such as a rapid heartbeat, sweating, and anxiety.  Avoid smokers because they can make you want to smoke.  Do not let weight gain distract you. Many smokers will gain weight when they quit, usually less than 10 pounds. Eat a healthy diet and stay active. You can always lose the weight gained after you quit.  Find ways to improve your mood other than smoking. FOR MORE INFORMATION  www.smokefree.gov    While it can be one of the most difficult things to do, the [place] community has programs to help you stop.  Consider talking with your primary care physician about options.  Also, Smoking Cessation classes are available through the [facility]:  The smoking cessation program is a proven-effective program from the American Lung Association. The program is available for anyone 12 and older who currently smokes. The program lasts for 7 weeks and is 8 sessions. Each class will be approximately 1 1/2 hours. The program is every Tuesday.  All classes are 12-1:30pm and same location.  Event Location Information:  Location: [facility] 2nd Floor Conference Room 2-037; located next to [facility] cross streets: [place] Entrance  into the [facility] is adjacent to the [employer] main entrance. The conference room is located on the 2nd floor.  Parking Instructions: Visitor parking is adjacent to [employer] main entrance and the [place]    A smoking cessation program is also offered through the [facility]. Register online at ClickDebate.gl or call [phone] for more information.   Tobacco cessation counseling is available at [facility]. Call [phone] for a free appointment.   Tobacco cessation classes also are available through the [facility] in [place]. For information, call [phone].   The Patient Education Network features videos on tobacco cessation. Please consult your listings in the center of this book to find instructions on how to access  this resource.   If you want more information, ask your nurse.          GENERAL SURGERY: POST OP INSTRUCTIONS  1. DIET: Follow a light bland diet the first 24 hours after arrival home, such as soup, liquids, crackers, etc.  Be sure to include lots of fluids daily.  Avoid fast food or heavy meals as your are more likely to get nauseated.   2. Take your usually prescribed home medications unless otherwise directed. 3. PAIN CONTROL: a. Pain is best controlled by a usual combination of three different methods TOGETHER: i. Ice/Heat ii. Over the counter pain medication iii. Prescription pain medication b. Most patients will experience some swelling and bruising around the incisions.  Ice packs or heating pads (30-60 minutes up to 6 times a day) will help. Use ice for the first few days to help decrease swelling and bruising, then switch to heat to help relax tight/sore spots and speed recovery.  Some people prefer to use ice alone, heat alone, alternating between ice & heat.  Experiment to what works for you.  Swelling and bruising can take several weeks to resolve.    c. It is helpful to take an over-the-counter pain medication regularly for the first few weeks.  Choose one of the following that works best for you: i. Naproxen (Aleve, etc)  Two 220mg  tabs twice a day ii. Ibuprofen (Advil, etc) Three 200mg  tabs four times a day (every meal & bedtime) iii. Acetaminophen (Tylenol, etc) 500-650mg  four times a day (every meal & bedtime) d. A  prescription for pain medication (such as oxycodone, hydrocodone, etc) should be given to you upon discharge.  Take your pain medication as prescribed.  i. If you are having problems/concerns with the prescription medicine (does not control pain, nausea, vomiting, rash, itching, etc), please call us [phone] to see if we need to switch you to a different pain medicine that will work better for you and/or control your side effect better. ii. If you need a refill on your pain medication, please contact your pharmacy.  They will contact our office to request authorization. Prescriptions will not be filled after 5 pm or on week-ends. 4. Avoid getting constipated.  Between the surgery and the pain medications, it is common to experience some constipation.  Increasing fluid intake and taking a fiber supplement (such as Metamucil, Citrucel, FiberCon, MiraLax, etc) 1-2 times a day regularly will usually help prevent this problem from occurring.  A mild laxative (prune juice, Milk of Magnesia, MiraLax, etc) should be taken according to package directions if there are no bowel movements after 48 hours.   5. Wash / shower every day.  You may shower over the dressings as they are waterproof.  Continue to shower over incision(s) after the dressing is off. 6. Remove your waterproof bandages 5 days after surgery.  You may leave the incision open to air.  You may have skin tapes (Steri Strips) covering the incision(s).  Leave them on until one week, then remove.  You may replace a dressing/Band-Aid to cover the incision for comfort if you wish.       7. ACTIVITIES as tolerated:   a. You may resume regular (light) daily activities beginning the next day-such as daily self-care, walking, climbing stairs-gradually increasing activities as tolerated.  If you can walk 30 minutes without difficulty, it is safe to try more intense activity such as jogging, treadmill, bicycling, low-impact aerobics, swimming, etc. b. Save the most intensive and  strenuous activity for last such as sit-ups, heavy lifting, contact sports, etc  Refrain from any heavy lifting or straining until you are off narcotics for pain control.   c. DO NOT PUSH THROUGH PAIN.  Let pain be your guide: If it hurts to do something, don't do it.  Pain is your body warning you to avoid that activity for another week until the pain goes down. d. You may drive when you are no longer taking prescription pain medication, you can comfortably wear a seatbelt, and you can safely maneuver your car and apply brakes. e. Dennis Bast may have sexual intercourse when it is comfortable.  8. FOLLOW UP in our office a. Please call [facility] at [phone] to set up an appointment to see your surgeon in the office for a follow-up appointment approximately 2-3 weeks after your surgery. b. Make sure that you call for this appointment the day you arrive home to insure a convenient appointment time. 9. IF YOU HAVE DISABILITY OR FAMILY LEAVE FORMS, BRING THEM TO THE OFFICE FOR PROCESSING.  DO NOT GIVE THEM TO YOUR DOCTOR.   WHEN TO CALL us [phone]: 1. Poor pain control 2. Reactions / problems with new medications (rash/itching, nausea, etc)  3. Fever over 101.5 F (38.5 C) 4. Worsening swelling or bruising 5. Continued bleeding from incision. 6. Increased pain, redness, or drainage from the incision 7. Difficulty breathing / swallowing   The clinic staff is available to answer your questions during regular business hours (8:30am-5pm).  Please don't hesitate to call and ask to speak to one of our nurses  for clinical concerns.   If you have a medical emergency, go to the nearest emergency room or call 911.  A surgeon from [facility] is always on call at the [facility], [place], [place], [place], [place]  [address] ? MAIN: [phone] ? TOLL FREE: [phone] ?  FAX [phone] [email or web address]  GETTING TO GOOD BOWEL HEALTH. Irregular bowel habits such as constipation and diarrhea can lead to many problems over time.  Having one soft bowel movement a day is the most important way to prevent further problems.  The anorectal canal is designed to handle stretching and feces to safely manage our ability to get rid of solid waste (feces, poop, stool) out of our body.  BUT, hard constipated stools can act like ripping concrete bricks and diarrhea can be a burning fire to this very sensitive area of our body, causing inflamed hemorrhoids, anal fissures, increasing risk is perirectal abscesses, abdominal pain/bloating, an making irritable bowel worse.     The goal: ONE SOFT BOWEL MOVEMENT A DAY!  To have soft, regular bowel movements:    Drink at least 8 tall glasses of water a day.     Take plenty of fiber.  Fiber is the undigested part of plant food that passes into the colon, acting s "natures broom" to encourage bowel motility and movement.  Fiber can absorb and hold large amounts of water. This results in a larger, bulkier stool, which is soft and easier to pass. Work gradually over several weeks up to 6 servings a day of fiber (25g a day even more if needed) in the form of: o Vegetables -- Root (potatoes, carrots, turnips), leafy green (lettuce, salad greens, celery, spinach), or cooked high residue (cabbage, broccoli, etc) o Fruit -- Fresh (unpeeled skin & pulp), Dried (prunes, apricots, cherries, etc ),  or stewed (  applesauce)  o Whole grain breads, pasta, etc (whole wheat)  o Bran cereals    Bulking Agents -- This  type of water-retaining fiber generally is easily obtained each day by one of the following:  o Psyllium bran -- The psyllium plant is remarkable because its ground seeds can retain so much water. This product is available as Metamucil, Konsyl, Effersyllium, Per Diem Fiber, or the less expensive generic preparation in drug and health food stores. Although labeled a laxative, it really is not a laxative.  o Methylcellulose -- This is another fiber derived from wood which also retains water. It is available as Citrucel. o Polyethylene Glycol - and "artificial" fiber commonly called Miralax or Glycolax.  It is helpful for people with gassy or bloated feelings with regular fiber o Flax Seed - a less gassy fiber than psyllium   No reading or other relaxing activity while on the toilet. If bowel movements take longer than 5 minutes, you are too constipated   AVOID CONSTIPATION.  High fiber and water intake usually takes care of this.  Sometimes a laxative is needed to stimulate more frequent bowel movements, but    Laxatives are not a good long-term solution as it can wear the colon out. o Osmotics (Milk of Magnesia, Fleets phosphosoda, Magnesium citrate, MiraLax, GoLytely) are safer than  o Stimulants (Senokot, Castor Oil, Dulcolax, Ex Lax)    o Do not take laxatives for more than 7days in a row.    IF SEVERELY CONSTIPATED, try a Bowel Retraining Program: o Do not use laxatives.  o Eat a diet high in roughage, such as bran cereals and leafy vegetables.  o Drink six (6) ounces of prune or apricot juice each morning.  o Eat two (2) large servings of stewed fruit each day.  o Take one (1) heaping tablespoon of a psyllium-based bulking agent twice a day. Use sugar-free sweetener when possible to avoid excessive calories.  o Eat a normal breakfast.  o Set aside 15 minutes after breakfast to sit on the toilet, but do not strain to have a bowel movement.  o If you do not have a bowel movement by the third  day, use an enema and repeat the above steps.    Controlling diarrhea o Switch to liquids and simpler foods for a few days to avoid stressing your intestines further. o Avoid dairy products (especially milk & ice cream) for a short time.  The intestines often can lose the ability to digest lactose when stressed. o Avoid foods that cause gassiness or bloating.  Typical foods include beans and other legumes, cabbage, broccoli, and dairy foods.  Every person has some sensitivity to other foods, so listen to our body and avoid those foods that trigger problems for you. o Adding fiber (Citrucel, Metamucil, psyllium, Miralax) gradually can help thicken stools by absorbing excess fluid and retrain the intestines to act more normally.  Slowly increase the dose over a few weeks.  Too much fiber too soon can backfire and cause cramping & bloating. o Probiotics (such as active yogurt, Align, etc) may help repopulate the intestines and colon with normal bacteria and calm down a sensitive digestive tract.  Most studies show it to be of mild help, though, and such products can be costly. o Medicines:   Bismuth subsalicylate (ex. Kayopectate, Pepto Bismol) every 30 minutes for up to 6 doses can help control diarrhea.  Avoid if pregnant.   Loperamide (Immodium) can slow down diarrhea.  Start with  two tablets (4mg  total) first and then try one tablet every 6 hours.  Avoid if you are having fevers or severe pain.  If you are not better or start feeling worse, stop all medicines and call your doctor for advice o Call your doctor if you are getting worse or not better.  Sometimes further testing (cultures, endoscopy, X-ray studies, bloodwork, etc) may be needed to help diagnose and treat the cause of the diarrhea. o

## 2013-09-20 ENCOUNTER — Encounter (HOSPITAL_COMMUNITY): Payer: Self-pay | Admitting: Pharmacy Technician

## 2013-09-21 ENCOUNTER — Encounter (HOSPITAL_COMMUNITY): Payer: Self-pay

## 2013-09-21 ENCOUNTER — Ambulatory Visit (HOSPITAL_COMMUNITY)
Admission: RE | Admit: 2013-09-21 | Discharge: 2013-09-21 | Disposition: A | Discharge status: Home / Self Care | Payer: 59 | Source: Ambulatory Visit | Attending: Anesthesiology | Admitting: Anesthesiology

## 2013-09-21 ENCOUNTER — Encounter (HOSPITAL_COMMUNITY)
Admission: RE | Admit: 2013-09-21 | Discharge: 2013-09-21 | Disposition: A | Discharge status: Home / Self Care | Payer: 59 | Source: Ambulatory Visit | Attending: General Surgery | Admitting: General Surgery

## 2013-09-21 DIAGNOSIS — Z01818 Encounter for other preprocedural examination: Secondary | ICD-10-CM | POA: Insufficient documentation

## 2013-09-21 DIAGNOSIS — R221 Localized swelling, mass and lump, neck: Secondary | ICD-10-CM | POA: Diagnosis not present

## 2013-09-21 DIAGNOSIS — I10 Essential (primary) hypertension: Secondary | ICD-10-CM | POA: Diagnosis not present

## 2013-09-21 DIAGNOSIS — F17200 Nicotine dependence, unspecified, uncomplicated: Secondary | ICD-10-CM | POA: Insufficient documentation

## 2013-09-21 DIAGNOSIS — R220 Localized swelling, mass and lump, head: Secondary | ICD-10-CM | POA: Insufficient documentation

## 2013-09-21 HISTORY — DX: Unspecified osteoarthritis, unspecified site: M19.90

## 2013-09-21 HISTORY — DX: Other complications of anesthesia, initial encounter: T88.59XA

## 2013-09-21 HISTORY — DX: Adverse effect of unspecified anesthetic, initial encounter: T41.45XA

## 2013-09-21 LAB — CBC
HCT: 43 % (ref 36.0–46.0)
Hemoglobin: 14.5 g/dL (ref 12.0–15.0)
MCH: 31.4 pg (ref 26.0–34.0)
MCHC: 33.7 g/dL (ref 30.0–36.0)
MCV: 93.1 fL (ref 78.0–100.0)
PLATELETS: 223 10*3/uL (ref 150–400)
RBC: 4.62 MIL/uL (ref 3.87–5.11)
RDW: 12.7 % (ref 11.5–15.5)
WBC: 8.9 10*3/uL (ref 4.0–10.5)

## 2013-09-21 LAB — BASIC METABOLIC PANEL
Anion gap: 11 (ref 5–15)
BUN: 14 mg/dL (ref 6–23)
CALCIUM: 9.6 mg/dL (ref 8.4–10.5)
CO2: 26 mEq/L (ref 19–32)
Chloride: 98 mEq/L (ref 96–112)
Creatinine, Ser: 0.9 mg/dL (ref 0.50–1.10)
GFR calc Af Amer: 77 mL/min — ABNORMAL LOW (ref >90)
GFR, EST NON AFRICAN AMERICAN: 67 mL/min — AB (ref >90)
Glucose, Bld: 83 mg/dL (ref 70–99)
Potassium: 4.3 mEq/L (ref 3.7–5.3)
Sodium: 135 mEq/L — ABNORMAL LOW (ref 137–147)

## 2013-09-21 NOTE — Pre-Procedure Instructions (Addendum)
09-21-13 Requested  EKG of 04-06-13-report([facility] [phone]-Dr. Alroy Dust), awaiting receipt to place with chart. CXR done today. 09-21-13 1410 EKG 04-06-13 received, copy placed with chart.

## 2013-09-21 NOTE — Patient Instructions (Addendum)
20 Kimberly [name]  09/21/2013   Your procedure is scheduled on:9-17   -2015  Enter through [facility] Entrance and follow signs to [facility]. Arrive at  0930      AM .  Call this number if you have problems the morning of surgery: [phone]  Or Presurgical Testing [phone].   For Living Will and/or Health Care Power Attorney Forms: please provide copy for your medical record,may bring AM of surgery(Forms should be already notarized -we do not provide this service).(09-21-13 Yes information preferred today and given.).      Do not eat food/ or drink: After Midnight.  .  Take these medicines the morning of surgery with A SIP OF WATER: Venlafaxine(Effexor).   Do not wear jewelry, make-up or nail polish.  Do not wear lotions, powders, or perfumes. You may not  wear deodorant.  Do not shave 48 hours(2 days) prior to first CHG shower(legs and under arms).(Shaving face and neck okay.)  Do not bring valuables to the hospital.(Hospital is not responsible for lost valuables).  Contacts, dentures or removable bridgework, body piercing, hair pins may not be worn into surgery.  Leave suitcase in the car. After surgery it may be brought to your room.  For patients admitted to the hospital, checkout time is 11:00 AM the day of discharge.(Restricted visitors-Any Persons displaying flu-like symptoms or illness).    Patients discharged the day of surgery will not be allowed to drive home. Must have responsible person with you x 24 hours once discharged.  Name and phone number of your driver: Carmin Muskrat [phone] cell Special Instructions: CHG(Chlorhedine 4%-"Hibiclens","Betasept","Aplicare") Shower Use Special Wash: see special instructions.(avoid face and genitals)        __________________________    [facility] - Preparing for Surgery Before surgery, you can play an important role.  Because skin is not sterile, your skin needs to be as free of germs as possible.  You  can reduce the number of germs on your skin by washing with CHG (chlorahexidine gluconate) soap before surgery.  CHG is an antiseptic cleaner which kills germs and bonds with the skin to continue killing germs even after washing. Please DO NOT use if you have an allergy to CHG or antibacterial soaps.  If your skin becomes reddened/irritated stop using the CHG and inform your nurse when you arrive at Short Stay. Do not shave (including legs and underarms) for at least 48 hours prior to the first CHG shower.  You may shave your face/neck. Please follow these instructions carefully:  1.  Shower with CHG Soap the night before surgery and the  morning of Surgery.  2.  If you choose to wash your hair, wash your hair first as usual with your  normal  shampoo.  3.  After you shampoo, rinse your hair and body thoroughly to remove the  shampoo.                           4.  Use CHG as you would any other liquid soap.  You can apply chg directly  to the skin and wash                       Gently with a scrungie or clean washcloth.  5.  Apply the CHG Soap to your body ONLY FROM THE NECK DOWN.   Do not use on face/ open  Wound or open sores. Avoid contact with eyes, ears mouth and genitals (private parts).                       Wash face,  Genitals (private parts) with your normal soap.             6.  Wash thoroughly, paying special attention to the area where your surgery  will be performed.  7.  Thoroughly rinse your body with warm water from the neck down.  8.  DO NOT shower/wash with your normal soap after using and rinsing off  the CHG Soap.                9.  Pat yourself dry with a clean towel.            10.  Wear clean pajamas.            11.  Place clean sheets on your bed the night of your first shower and do not  sleep with pets. Day of Surgery : Do not apply any lotions/deodorants the morning of surgery.  Please wear clean clothes to the hospital/surgery center.  FAILURE  TO FOLLOW THESE INSTRUCTIONS MAY RESULT IN THE CANCELLATION OF YOUR SURGERY PATIENT SIGNATURE_________________________________  NURSE SIGNATURE__________________________________  ________________________________________________________________________

## 2013-09-27 MED ORDER — GENTAMICIN SULFATE 40 MG/ML IJ SOLN
260.0000 mg | INTRAVENOUS | Status: AC
  Administered 2013-09-28: 260 mg via INTRAVENOUS
  Filled 2013-09-27 (×2): qty 6.5

## 2013-09-28 ENCOUNTER — Ambulatory Visit (HOSPITAL_COMMUNITY): Payer: 59 | Admitting: Anesthesiology

## 2013-09-28 ENCOUNTER — Encounter (HOSPITAL_COMMUNITY): Payer: 59 | Admitting: Anesthesiology

## 2013-09-28 ENCOUNTER — Encounter (HOSPITAL_COMMUNITY): Payer: Self-pay | Admitting: *Deleted

## 2013-09-28 ENCOUNTER — Encounter (HOSPITAL_COMMUNITY)
Admission: RE | Disposition: A | Discharge status: Home / Self Care | Payer: Self-pay | Source: Ambulatory Visit | Attending: Surgery

## 2013-09-28 ENCOUNTER — Ambulatory Visit (HOSPITAL_COMMUNITY)
Admission: RE | Admit: 2013-09-28 | Discharge: 2013-09-28 | Disposition: A | Discharge status: Home / Self Care | Payer: 59 | Source: Ambulatory Visit | Attending: Surgery | Admitting: Surgery

## 2013-09-28 DIAGNOSIS — Z880 Allergy status to penicillin: Secondary | ICD-10-CM | POA: Diagnosis not present

## 2013-09-28 DIAGNOSIS — Z79899 Other long term (current) drug therapy: Secondary | ICD-10-CM | POA: Diagnosis not present

## 2013-09-28 DIAGNOSIS — R221 Localized swelling, mass and lump, neck: Secondary | ICD-10-CM | POA: Diagnosis present

## 2013-09-28 DIAGNOSIS — E785 Hyperlipidemia, unspecified: Secondary | ICD-10-CM | POA: Insufficient documentation

## 2013-09-28 DIAGNOSIS — Z7982 Long term (current) use of aspirin: Secondary | ICD-10-CM | POA: Insufficient documentation

## 2013-09-28 DIAGNOSIS — Z882 Allergy status to sulfonamides status: Secondary | ICD-10-CM | POA: Insufficient documentation

## 2013-09-28 DIAGNOSIS — D1779 Benign lipomatous neoplasm of other sites: Secondary | ICD-10-CM | POA: Diagnosis not present

## 2013-09-28 DIAGNOSIS — I10 Essential (primary) hypertension: Secondary | ICD-10-CM | POA: Insufficient documentation

## 2013-09-28 DIAGNOSIS — Z885 Allergy status to narcotic agent status: Secondary | ICD-10-CM | POA: Diagnosis not present

## 2013-09-28 DIAGNOSIS — F17200 Nicotine dependence, unspecified, uncomplicated: Secondary | ICD-10-CM | POA: Insufficient documentation

## 2013-09-28 DIAGNOSIS — R220 Localized swelling, mass and lump, head: Secondary | ICD-10-CM | POA: Diagnosis present

## 2013-09-28 HISTORY — PX: LESION REMOVAL: SHX5196

## 2013-09-28 SURGERY — EXCISION, LESION, NECK
Anesthesia: General | Site: Neck

## 2013-09-28 MED ORDER — CHLORHEXIDINE GLUCONATE 4 % EX LIQD
1.0000 | Freq: Once | CUTANEOUS | Status: DC
Start: 2013-09-29 — End: 2013-09-28

## 2013-09-28 MED ORDER — PROMETHAZINE HCL 25 MG/ML IJ SOLN: 6.2500 mg | INTRAMUSCULAR | Status: DC | PRN

## 2013-09-28 MED ORDER — PROPOFOL 10 MG/ML IV BOLUS
INTRAVENOUS | Status: DC | PRN
  Administered 2013-09-28: 120 mg via INTRAVENOUS
  Administered 2013-09-28: 80 mg via INTRAVENOUS

## 2013-09-28 MED ORDER — DEXAMETHASONE SODIUM PHOSPHATE 10 MG/ML IJ SOLN
INTRAMUSCULAR | Status: DC | PRN
  Administered 2013-09-28: 10 mg via INTRAVENOUS

## 2013-09-28 MED ORDER — BUPIVACAINE-EPINEPHRINE 0.25% -1:200000 IJ SOLN
INTRAMUSCULAR | Status: AC
  Filled 2013-09-28: qty 1

## 2013-09-28 MED ORDER — KETOROLAC TROMETHAMINE 30 MG/ML IJ SOLN
INTRAMUSCULAR | Status: DC | PRN
  Administered 2013-09-28: 30 mg via INTRAVENOUS

## 2013-09-28 MED ORDER — CLINDAMYCIN PHOSPHATE 900 MG/50ML IV SOLN
900.0000 mg | INTRAVENOUS | Status: AC
  Administered 2013-09-28: 900 mg via INTRAVENOUS

## 2013-09-28 MED ORDER — FENTANYL CITRATE 0.05 MG/ML IJ SOLN
INTRAMUSCULAR | Status: AC
  Filled 2013-09-28: qty 2

## 2013-09-28 MED ORDER — MEPERIDINE HCL 50 MG/ML IJ SOLN: 6.2500 mg | INTRAMUSCULAR | Status: DC | PRN

## 2013-09-28 MED ORDER — LACTATED RINGERS IV SOLN
INTRAVENOUS | Status: DC
  Administered 2013-09-28: 13:00:00 via INTRAVENOUS
  Administered 2013-09-28: 1000 mL via INTRAVENOUS

## 2013-09-28 MED ORDER — MIDAZOLAM HCL 2 MG/2ML IJ SOLN
INTRAMUSCULAR | Status: AC
  Filled 2013-09-28: qty 2

## 2013-09-28 MED ORDER — ONDANSETRON HCL 4 MG/2ML IJ SOLN
INTRAMUSCULAR | Status: DC | PRN
Start: 2013-09-28 — End: 2013-09-28
  Administered 2013-09-28: 4 mg via INTRAVENOUS

## 2013-09-28 MED ORDER — FENTANYL CITRATE 0.05 MG/ML IJ SOLN: 25.0000 ug | INTRAMUSCULAR | Status: DC | PRN

## 2013-09-28 MED ORDER — CLINDAMYCIN PHOSPHATE 900 MG/50ML IV SOLN
INTRAVENOUS | Status: AC
  Filled 2013-09-28: qty 50

## 2013-09-28 MED ORDER — TRAMADOL HCL 50 MG PO TABS: 50.0000 mg | ORAL_TABLET | Freq: Four times a day (QID) | ORAL | Status: DC | PRN

## 2013-09-28 MED ORDER — PROPOFOL 10 MG/ML IV BOLUS
INTRAVENOUS | Status: AC
  Filled 2013-09-28: qty 20

## 2013-09-28 MED ORDER — BUPIVACAINE-EPINEPHRINE 0.25% -1:200000 IJ SOLN
INTRAMUSCULAR | Status: DC | PRN
  Administered 2013-09-28: 50 mL

## 2013-09-28 MED ORDER — LIDOCAINE HCL (PF) 2 % IJ SOLN
INTRAMUSCULAR | Status: DC | PRN
  Administered 2013-09-28: 75 mg via INTRADERMAL

## 2013-09-28 MED ORDER — DEXAMETHASONE SODIUM PHOSPHATE 10 MG/ML IJ SOLN
INTRAMUSCULAR | Status: AC
  Filled 2013-09-28: qty 1

## 2013-09-28 MED ORDER — ONDANSETRON HCL 4 MG/2ML IJ SOLN
INTRAMUSCULAR | Status: AC
  Filled 2013-09-28: qty 2

## 2013-09-28 MED ORDER — CHLORHEXIDINE GLUCONATE 4 % EX LIQD: 1.0000 "application " | Freq: Once | CUTANEOUS | Status: DC

## 2013-09-28 MED ORDER — KETOROLAC TROMETHAMINE 30 MG/ML IJ SOLN
INTRAMUSCULAR | Status: AC
  Filled 2013-09-28: qty 1

## 2013-09-28 MED ORDER — SUCCINYLCHOLINE CHLORIDE 20 MG/ML IJ SOLN
INTRAMUSCULAR | Status: DC | PRN
  Administered 2013-09-28: 100 mg via INTRAVENOUS

## 2013-09-28 MED ORDER — SODIUM CHLORIDE 0.9 % IJ SOLN
INTRAMUSCULAR | Status: AC
  Filled 2013-09-28: qty 10

## 2013-09-28 MED ORDER — MIDAZOLAM HCL 5 MG/5ML IJ SOLN
INTRAMUSCULAR | Status: DC | PRN
  Administered 2013-09-28: 2 mg via INTRAVENOUS

## 2013-09-28 MED ORDER — EPHEDRINE SULFATE 50 MG/ML IJ SOLN
INTRAMUSCULAR | Status: AC
  Filled 2013-09-28: qty 1

## 2013-09-28 MED ORDER — 0.9 % SODIUM CHLORIDE (POUR BTL) OPTIME
TOPICAL | Status: DC | PRN
  Administered 2013-09-28: 1000 mL

## 2013-09-28 MED ORDER — FENTANYL CITRATE 0.05 MG/ML IJ SOLN
INTRAMUSCULAR | Status: DC | PRN
  Administered 2013-09-28: 100 ug via INTRAVENOUS
  Administered 2013-09-28: 50 ug via INTRAVENOUS

## 2013-09-28 SURGICAL SUPPLY — 31 items
APL SKNCLS STERI-STRIP NONHPOA (GAUZE/BANDAGES/DRESSINGS)
BENZOIN TINCTURE PRP APPL 2/3 (GAUZE/BANDAGES/DRESSINGS) IMPLANT
BLADE HEX COATED 2.75 (ELECTRODE) ×3 IMPLANT
CANISTER SUCTION 2500CC (MISCELLANEOUS) ×1 IMPLANT
CLOSURE WOUND 1/2 X4 (GAUZE/BANDAGES/DRESSINGS) ×1
DECANTER SPIKE VIAL GLASS SM (MISCELLANEOUS) ×2 IMPLANT
DRAPE LAPAROTOMY T 102X78X121 (DRAPES) IMPLANT
DRAPE LAPAROTOMY TRNSV 102X78 (DRAPE) IMPLANT
DRSG TEGADERM 4X4.75 (GAUZE/BANDAGES/DRESSINGS) ×2 IMPLANT
ELECT REM PT RETURN 9FT ADLT (ELECTROSURGICAL) ×3
ELECTRODE REM PT RTRN 9FT ADLT (ELECTROSURGICAL) ×1 IMPLANT
GAUZE SPONGE 4X4 12PLY STRL (GAUZE/BANDAGES/DRESSINGS) ×3 IMPLANT
GAUZE SPONGE 4X4 16PLY XRAY LF (GAUZE/BANDAGES/DRESSINGS) ×2 IMPLANT
GLOVE BIOGEL PI IND STRL 7.0 (GLOVE) ×1 IMPLANT
GLOVE BIOGEL PI INDICATOR 7.0 (GLOVE) ×2
GLOVE ECLIPSE 8.0 STRL XLNG CF (GLOVE) ×3 IMPLANT
GLOVE INDICATOR 8.0 STRL GRN (GLOVE) ×6 IMPLANT
GOWN STRL REUS W/TWL LRG LVL3 (GOWN DISPOSABLE) ×3 IMPLANT
GOWN STRL REUS W/TWL XL LVL3 (GOWN DISPOSABLE) ×6 IMPLANT
KIT BASIN OR (CUSTOM PROCEDURE TRAY) ×3 IMPLANT
NEEDLE HYPO 22GX1.5 SAFETY (NEEDLE) ×3 IMPLANT
NS IRRIG 1000ML POUR BTL (IV SOLUTION) ×3 IMPLANT
PACK GENERAL/GYN (CUSTOM PROCEDURE TRAY) ×3 IMPLANT
PEN SKIN MARKING BROAD (MISCELLANEOUS) IMPLANT
SPONGE LAP 4X18 X RAY DECT (DISPOSABLE) IMPLANT
STRIP CLOSURE SKIN 1/2X4 (GAUZE/BANDAGES/DRESSINGS) ×1 IMPLANT
SUT MNCRL AB 4-0 PS2 18 (SUTURE) IMPLANT
SUT VIC AB 3-0 SH 27 (SUTURE)
SUT VIC AB 3-0 SH 27XBRD (SUTURE) IMPLANT
SYR 20CC LL (SYRINGE) ×3 IMPLANT
TOWEL OR 17X26 10 PK STRL BLUE (TOWEL DISPOSABLE) ×3 IMPLANT

## 2013-09-28 NOTE — Op Note (Signed)
09/28/2013  1:17 PM  PATIENT:  Kimberly [name]  63 y.o. female  Patient Care Team: Mayra Neer, MD as PCP - General (Family Medicine) Harriet Masson, DPM as Consulting Physician (Podiatry)  PRE-OPERATIVE DIAGNOSIS:  Neck Mass  POST-OPERATIVE DIAGNOSIS:  Neck Mass  PROCEDURE:  Procedure(s): REMOVAL OF POSTERIOR NECK MASS  SURGEON:  Surgeon(s): Michael Boston, MD  ASSISTANT: RN   ANESTHESIA:   local and general  EBL:  Total I/O In: 1000 [I.V.:1000] Out: -   Delay start of Pharmacological VTE agent (>24hrs) due to surgical blood loss or risk of bleeding:  no  DRAINS: none   SPECIMEN:  Source of Specimen:  NECK MASS 4CM (probable lipoma)  DISPOSITION OF SPECIMEN:  PATHOLOGY  COUNTS:  YES  PLAN OF CARE: Discharge to home after PACU  PATIENT DISPOSITION:  PACU - hemodynamically stable.  INDICATION:    Assessment:   Enlarging tender posterior neck mass in woman with history of numerous lipomas  Plan:   I think she would benefit from resection of the mass since it is becoming larger and more sensitive. Most likely a lipoma. Given the location, would need at least deep sedation if not general anesthesia. Drain not to likely. She is interested in proceeding:  The pathophysiology of skin & subcutaneous masses was discussed. Natural history risks without surgery were discussed. I recommended surgery to remove the mass. I explained the technique of removal with use of local anesthesia & possible need for more aggressive sedation/anesthesia for patient comfort.  Risks such as bleeding, infection, heart attack, death, and other risks were discussed. I noted a good likelihood this will help address the problem. Possibility that this will not correct all symptoms was explained. Possibility of regrowth/recurrence of the mass was discussed. We will work to minimize complications. Questions were answered. The patient expresses understanding & wishes to proceed with surgery.   OR  FINDINGS: 4x2x2cm ellipsoid fibrofatty mass densely adherent to posterior neck musculature  DESCRIPTION: Informed consent confirmed. Patient underwent general anesthesia without difficulty.  Eventually position right side down decubitus.  Neck clipped prepped and draped in sterile fashion.  Surgical timeout confirmed plan.  I marked out the region of the posterior neck mass.  I placed a field block of local anesthetic.  I made a transverse incision over the central part of the mass.  Freed the mass from its adhesions to the subcutaneous tissues.  It was not well encapsulated and poorly defined.   It was densely adherent to the posterior neck musculature.  I carefully freed it  off.  I assured hemostasis.  I closed the wound using interrupted Vicryl deep dermal suture and running 4 Monocryl suture with Steri-Strips.  Patient extubated to recovery room.  Looks in good shape.  Discussed postoperative care and intraoperative findings with patient's family.  Questions answered.     Adin Hector, M.D., F.A.C.S. Gastrointestinal and Minimally Invasive Surgery [facility] [address] [place], [place] [address] [phone] Main / Paging

## 2013-09-28 NOTE — H&P (View-Only) (Signed)
Subjective:     Patient ID: [name], female   DOB: 10/14/1950, 63 y.o.   MRN: [id number]  HPI  Note: Portions of this report may have been transcribed using voice recognition software. Every effort was made to ensure accuracy; however, inadvertent computerized transcription errors may be present.   Any transcriptional errors that result from this process are unintentional.            NHUNG DANKO  1950-03-11 [id number]  Patient Care Team: Mayra Neer, MD as PCP - General (Family Medicine) Harriet Masson, DPM as Consulting Physician (Podiatry)  This patient is a 63 y.o.female who presents today for surgical evaluation.   Reason for visit: Enlarging sensitive posterior neck mass.  Pleasant woman with history of numerous benign breast masses and lipomas that required excision inspiratory over the past several decades.  I last saw her several years ago to remove some off of her extremities.  Recovered.  She has noted in the past 2 years she has a mass enlarging on her posterior neck.  Gets tender.  He gets headaches.  Sometimes pain radiates down her neck to her left shoulder.  Had workup negative for any spinal disease.  Wish to have the area removed before it gets worse.  She does smoke a pack a day.  Some issues with a little bruising but not on blood thinners.  No history of cardiac disease.  Otherwise very active.  No complications or wound problems with prior numerous excisions.  Patient Active Problem List   Diagnosis Date Noted  . Lipoma of right forearm, left abdominal wall, left inner thigh 07/08/2010    Past Medical History  Diagnosis Date  . Hyperlipemia   . Hypertension   . Bronchitis   . Hiatal hernia   . History of blood in urine   . Generalized headaches   . Wears glasses   . Osteoporosis     Past Surgical History  Procedure Laterality Date  . Thyroid lobectomy  1970's  . Appendectomy  1970's  . Partial hysterectomy  1984  . Breast surgery     x3 fibrocystic disease, radial scarring  . Bunionectomy Right   . Hammer toe surgery Right     4,5    History   Social History  . Marital Status: Married    Spouse Name: N/A    Number of Children: N/A  . Years of Education: N/A   Occupational History  . Not on file.   Social History Main Topics  . Smoking status: Current Every Day Smoker -- 1.00 packs/day    Types: Cigarettes  . Smokeless tobacco: Not on file  . Alcohol Use: 0.0 oz/week     Comment: socially, once in a while  . Drug Use: No  . Sexual Activity: Not on file   Other Topics Concern  . Not on file   Social History Narrative  . No narrative on file    Family History  Problem Relation Age of Onset  . Heart disease Father   . Heart disease Sister     cardiac arrest  . Cancer Brother     lymphoma    Current Outpatient Prescriptions  Medication Sig Dispense Refill  . aspirin 81 MG tablet Take 81 mg by mouth daily.        . Calcium Carbonate (CALCIUM 500 PO) Take 500 mg by mouth 2 (two) times daily.        . Cholecalciferol (VITAMIN D) 1000 UNITS capsule Take  1,000 Units by mouth 2 (two) times daily.        . fish oil-omega-3 fatty acids 1000 MG capsule Take 2 g by mouth 2 (two) times daily.        Marland Kitchen lisinopril (PRINIVIL,ZESTRIL) 5 MG tablet Take 5 mg by mouth daily.        . Multiple Vitamins-Minerals (MEGA MULTI WOMEN PO) Take by mouth 2 (two) times daily.        Marland Kitchen venlafaxine (EFFEXOR) 75 MG tablet Take 75 mg by mouth daily.         No current facility-administered medications for this visit.     Allergies  Allergen Reactions  . Oxycodone Itching  . Penicillins     REACTION: rash  . Sulfonamide Derivatives     REACTION: rash    BP 126/70  Pulse 72  Temp(Src) 97.5 F (36.4 C)  Ht 4\' 10"  (1.473 m)  Wt 114 lb (51.71 kg)  BMI 23.83 kg/m2  No results found.   Review of Systems  Constitutional: Negative for fever, chills, diaphoresis, appetite change and fatigue.  HENT: Negative for ear  discharge, ear pain, sore throat and trouble swallowing.   Eyes: Negative for photophobia, discharge and visual disturbance.  Respiratory: Negative for cough, choking, chest tightness and shortness of breath.   Cardiovascular: Negative for chest pain and palpitations.  Gastrointestinal: Positive for constipation. Negative for nausea, vomiting, abdominal pain, diarrhea, anal bleeding and rectal pain.  Endocrine: Negative for cold intolerance and heat intolerance.  Genitourinary: Positive for hematuria. Negative for dysuria, frequency and difficulty urinating.  Musculoskeletal: Positive for neck pain. Negative for arthralgias, back pain, gait problem, myalgias and neck stiffness.  Skin: Negative for color change, pallor and rash.  Allergic/Immunologic: Negative for environmental allergies, food allergies and immunocompromised state.  Neurological: Negative for dizziness, speech difficulty, weakness and numbness.  Hematological: Negative for adenopathy. Bruises/bleeds easily.  Psychiatric/Behavioral: Negative for confusion and agitation. The patient is not nervous/anxious.        Objective:   Physical Exam  Constitutional: She is oriented to person, place, and time. She appears well-developed and well-nourished. No distress.  HENT:  Head: Normocephalic.  Mouth/Throat: Oropharynx is clear and moist. No oropharyngeal exudate.  Eyes: Conjunctivae and EOM are normal. Pupils are equal, round, and reactive to light. No scleral icterus.  Neck: Trachea normal and normal range of motion. Normal carotid pulses and no JVD present. Muscular tenderness present. No spinous process tenderness present. Carotid bruit is not present. No tracheal deviation, no edema, no erythema and normal range of motion present. No mass present.    Cardiovascular: Normal rate, regular rhythm and intact distal pulses.   Pulmonary/Chest: Effort normal and breath sounds normal. No stridor. No respiratory distress. She exhibits  no tenderness.  Abdominal: Soft. She exhibits no distension and no mass. There is no tenderness. Hernia confirmed negative in the right inguinal area and confirmed negative in the left inguinal area.  Genitourinary: No vaginal discharge found.  Musculoskeletal: Normal range of motion. She exhibits no tenderness.       Right shoulder: Normal. She exhibits normal range of motion, no tenderness, no deformity and no laceration.       Left shoulder: Normal. She exhibits normal range of motion, no tenderness, no bony tenderness, no deformity, no laceration and normal strength.       Right elbow: She exhibits normal range of motion.       Left elbow: She exhibits normal range of motion.  Right wrist: She exhibits normal range of motion.       Left wrist: She exhibits normal range of motion.       Right hand: Normal strength noted.       Left hand: Normal strength noted.  Lymphadenopathy:       Head (right side): No posterior auricular adenopathy present.       Head (left side): No posterior auricular adenopathy present.    She has no cervical adenopathy.    She has no axillary adenopathy.       Right: No inguinal adenopathy present.       Left: No inguinal adenopathy present.  Neurological: She is alert and oriented to person, place, and time. No cranial nerve deficit. She exhibits normal muscle tone. Coordination normal.  Skin: Skin is warm and dry. No rash noted. She is not diaphoretic. No erythema.  Psychiatric: She has a normal mood and affect. Her behavior is normal. Judgment and thought content normal.       Assessment:     Enlarging tender posterior neck mass in woman with history of numerous lipomas      Plan:     I think she would benefit from resection of the mass since it is becoming larger and more sensitive.  Most likely a lipoma.  Given the location, would need at least deep sedation if not general anesthesia.  Drain not to likely.  She is interested in proceeding:  The  pathophysiology of skin & subcutaneous masses was discussed.  Natural history risks without surgery were discussed.  I recommended surgery to remove the mass.  I explained the technique of removal with use of local anesthesia & possible need for more aggressive sedation/anesthesia for patient comfort.    Risks such as bleeding, infection, heart attack, death, and other risks were discussed.  I noted a good likelihood this will help address the problem.   Possibility that this will not correct all symptoms was explained. Possibility of regrowth/recurrence of the mass was discussed.  We will work to minimize complications. Questions were answered.  The patient expresses understanding & wishes to proceed with surgery.   I recommend that she STOP SMOKING! We talked to the patient about the dangers of smoking.  We stressed that tobacco use dramatically increases the risk of peri-operative complications such as infection, tissue necrosis leaving to problems with incision/wound and organ healing, hernia, chronic pain, heart attack, stroke, DVT, pulmonary embolism, and death.  We noted there are programs in our community to help stop smoking.  Information was available.

## 2013-09-28 NOTE — Interval H&P Note (Signed)
History and Physical Interval Note:  09/28/2013 11:10 AM  Kimberly [name]  has presented today for surgery, with the diagnosis of Neck Mass  The various methods of treatment have been discussed with the patient and family. After consideration of risks, benefits and other options for treatment, the patient has consented to  Procedure(s): REMOVAL OF POSTERIOR NECK MASS (N/A) as a surgical intervention .  The patient's history has been reviewed, patient examined, no change in status, stable for surgery.  I have reviewed the patient's chart and labs.  Questions were answered to the patient's satisfaction.     Hasan Douse C.

## 2013-09-28 NOTE — Transfer of Care (Signed)
Immediate Anesthesia Transfer of Care Note  Patient: Kimberly [name]  Procedure(s) Performed: Procedure(s): REMOVAL OF POSTERIOR NECK MASS (N/A)  Patient Location: PACU  Anesthesia Type:General  Level of Consciousness: awake, sedated and responds to stimulation  Airway & Oxygen Therapy: Patient Spontanous Breathing and Patient connected to face mask oxygen  Post-op Assessment: Report given to PACU RN, Post -op Vital signs reviewed and stable and Patient moving all extremities X 4  Post vital signs: Reviewed and stable  Complications: No apparent anesthesia complications

## 2013-09-28 NOTE — Anesthesia Postprocedure Evaluation (Signed)
  Anesthesia Post-op Note  Patient: Kimberly [name]  Procedure(s) Performed: Procedure(s) (LRB): REMOVAL OF POSTERIOR NECK MASS (N/A)  Patient Location: PACU  Anesthesia Type: General  Level of Consciousness: awake and alert   Airway and Oxygen Therapy: Patient Spontanous Breathing  Post-op Pain: mild  Post-op Assessment: Post-op Vital signs reviewed, Patient's Cardiovascular Status Stable, Respiratory Function Stable, Patent Airway and No signs of Nausea or vomiting  Last Vitals:  Filed Vitals:   09/28/13 1335  BP:   Pulse: 117  Temp:   Resp: 21    Post-op Vital Signs: stable   Complications: No apparent anesthesia complications

## 2013-09-28 NOTE — Anesthesia Preprocedure Evaluation (Addendum)
Anesthesia Evaluation  Patient identified by MRN, date of birth, ID band Patient awake    Reviewed: Allergy & Precautions, H&P , NPO status , Patient's Chart, lab work & pertinent test results  History of Anesthesia Complications Negative for: history of anesthetic complications  Airway Mallampati: II TM Distance: >3 FB Neck ROM: Full    Dental no notable dental hx.    Pulmonary Current Smoker,  breath sounds clear to auscultation  Pulmonary exam normal       Cardiovascular hypertension, Pt. on medications negative cardio ROS  Rhythm:Regular Rate:Normal     Neuro/Psych  Headaches, Anxiety Depression    GI/Hepatic negative GI ROS, Neg liver ROS, hiatal hernia,   Endo/Other  negative endocrine ROS  Renal/GU negative Renal ROS  negative genitourinary   Musculoskeletal negative musculoskeletal ROS (+) Arthritis -, Osteoarthritis,    Abdominal   Peds negative pediatric ROS (+)  Hematology negative hematology ROS (+)   Anesthesia Other Findings   Reproductive/Obstetrics negative OB ROS                          Anesthesia Physical Anesthesia Plan  ASA: II  Anesthesia Plan: General   Post-op Pain Management:    Induction: Intravenous  Airway Management Planned: Oral ETT  Additional Equipment:   Intra-op Plan:   Post-operative Plan: Extubation in OR  Informed Consent: I have reviewed the patients History and Physical, chart, labs and discussed the procedure including the risks, benefits and alternatives for the proposed anesthesia with the patient or authorized representative who has indicated his/her understanding and acceptance.   Dental advisory given  Plan Discussed with: CRNA  Anesthesia Plan Comments:         Anesthesia Quick Evaluation

## 2013-09-28 NOTE — Discharge Instructions (Signed)
GENERAL SURGERY: POST OP INSTRUCTIONS ° °1. DIET: Follow a light bland diet the first 24 hours after arrival home, such as soup, liquids, crackers, etc.  Be sure to include lots of fluids daily.  Avoid fast food or heavy meals as your are more likely to get nauseated.   °2. Take your usually prescribed home medications unless otherwise directed. °3. PAIN CONTROL: °a. Pain is best controlled by a usual combination of three different methods TOGETHER: °i. Ice/Heat °ii. Over the counter pain medication °iii. Prescription pain medication °b. Most patients will experience some swelling and bruising around the incisions.  Ice packs or heating pads (30-60 minutes up to 6 times a day) will help. Use ice for the first few days to help decrease swelling and bruising, then switch to heat to help relax tight/sore spots and speed recovery.  Some people prefer to use ice alone, heat alone, alternating between ice & heat.  Experiment to what works for you.  Swelling and bruising can take several weeks to resolve.   °c. It is helpful to take an over-the-counter pain medication regularly for the first few weeks.  Choose one of the following that works best for you: °i. Naproxen (Aleve, etc)  Two 220mg tabs twice a day °ii. Ibuprofen (Advil, etc) Three 200mg tabs four times a day (every meal & bedtime) °iii. Acetaminophen (Tylenol, etc) 500-650mg four times a day (every meal & bedtime) °d. A  prescription for pain medication (such as oxycodone, hydrocodone, etc) should be given to you upon discharge.  Take your pain medication as prescribed.  °i. If you are having problems/concerns with the prescription medicine (does not control pain, nausea, vomiting, rash, itching, etc), please call us [phone] to see if we need to switch you to a different pain medicine that will work better for you and/or control your side effect better. °ii. If you need a refill on your pain medication, please contact your pharmacy.  They will contact our  office to request authorization. Prescriptions will not be filled after 5 pm or on week-ends. °4. Avoid getting constipated.  Between the surgery and the pain medications, it is common to experience some constipation.  Increasing fluid intake and taking a fiber supplement (such as Metamucil, Citrucel, FiberCon, MiraLax, etc) 1-2 times a day regularly will usually help prevent this problem from occurring.  A mild laxative (prune juice, Milk of Magnesia, MiraLax, etc) should be taken according to package directions if there are no bowel movements after 48 hours.   °5. Wash / shower every day.  You may shower over the dressings as they are waterproof.  Continue to shower over incision(s) after the dressing is off. °6. Remove your waterproof bandages 5 days after surgery.  You may leave the incision open to air.  You may have skin tapes (Steri Strips) covering the incision(s).  Leave them on until one week, then remove.  You may replace a dressing/Band-Aid to cover the incision for comfort if you wish.  ° ° ° ° °7. ACTIVITIES as tolerated:   °a. You may resume regular (light) daily activities beginning the next day--such as daily self-care, walking, climbing stairs--gradually increasing activities as tolerated.  If you can walk 30 minutes without difficulty, it is safe to try more intense activity such as jogging, treadmill, bicycling, low-impact aerobics, swimming, etc. °b. Save the most intensive and strenuous activity for last such as sit-ups, heavy lifting, contact sports, etc  Refrain from any heavy lifting or straining until you   are off narcotics for pain control.   c. DO NOT PUSH THROUGH PAIN.  Let pain be your guide: If it hurts to do something, don't do it.  Pain is your body warning you to avoid that activity for another week until the pain goes down. d. You may drive when you are no longer taking prescription pain medication, you can comfortably wear a seatbelt, and you can safely maneuver your car and  apply brakes. e. Dennis Bast may have sexual intercourse when it is comfortable.  8. FOLLOW UP in our office a. Please call [facility] at [phone] to set up an appointment to see your surgeon in the office for a follow-up appointment approximately 2-3 weeks after your surgery. b. Make sure that you call for this appointment the day you arrive home to insure a convenient appointment time. 9. IF YOU HAVE DISABILITY OR FAMILY LEAVE FORMS, BRING THEM TO THE OFFICE FOR PROCESSING.  DO NOT GIVE THEM TO YOUR DOCTOR.   WHEN TO CALL us [phone]: 1. Poor pain control 2. Reactions / problems with new medications (rash/itching, nausea, etc)  3. Fever over 101.5 F (38.5 C) 4. Worsening swelling or bruising 5. Continued bleeding from incision. 6. Increased pain, redness, or drainage from the incision 7. Difficulty breathing / swallowing   The clinic staff is available to answer your questions during regular business hours (8:30am-5pm).  Please dont hesitate to call and ask to speak to one of our nurses for clinical concerns.   If you have a medical emergency, go to the nearest emergency room or call 911.  A surgeon from [facility] is always on call at the [facility], [address], [place], [place], [place]  [address] ? MAIN: [phone] ? TOLL FREE: [phone] ?  FAX [phone] [email or web address]  Managing Pain  Pain after surgery or related to activity is often due to strain/injury to muscle, tendon, nerves and/or incisions.  This pain is usually short-term and will improve in a few months.   Many people find it helpful to do the following things TOGETHER to help speed the process of healing and to get back to regular activity more quickly:  1. Avoid heavy physical activity a.  no lifting greater than 20 pounds b. Do not push through the pain.  Listen to your body and avoid positions and maneuvers than reproduce the  pain c. Walking is okay as tolerated, but go slowly and stop when getting sore.  d. Remember: If it hurts to do it, then dont do it! 2. Take Anti-inflammatory medication  a. Take with food/snack around the clock for 1-2 weeks i. This helps the muscle and nerve tissues become less irritable and calm down faster b. Choose ONE of the following over-the-counter medications: i. Naproxen 220mg  tabs (ex. Aleve) 1-2 pills twice a day  ii. Ibuprofen 200mg  tabs (ex. Advil, Motrin) 3-4 pills with every meal and just before bedtime iii. Acetaminophen 500mg  tabs (Tylenol) 1-2 pills with every meal and just before bedtime 3. Use a Heating pad or Ice/Cold Pack a. 4-6 times a day b. May use warm bath/hottub  or showers 4. Try Gentle Massage and/or Stretching  a. at the area of pain many times a day b. stop if you feel pain - do not overdo it  Try these steps together to help you body heal faster and avoid making things get worse.  Doing just one of these things may not be  enough.    If you are not getting better after two weeks or are noticing you are getting worse, contact our office for further advice; we may need to re-evaluate you & see what other things we can do to help.

## 2013-09-29 ENCOUNTER — Encounter (HOSPITAL_COMMUNITY): Payer: Self-pay | Admitting: Surgery

## 2013-10-17 ENCOUNTER — Encounter: Payer: Self-pay | Admitting: Internal Medicine

## 2013-12-26 ENCOUNTER — Ambulatory Visit (INDEPENDENT_AMBULATORY_CARE_PROVIDER_SITE_OTHER): Payer: 59

## 2013-12-26 ENCOUNTER — Other Ambulatory Visit: Payer: Self-pay

## 2013-12-26 VITALS — BP 111/69 | HR 113 | Resp 18

## 2013-12-26 DIAGNOSIS — M79671 Pain in right foot: Secondary | ICD-10-CM

## 2013-12-26 DIAGNOSIS — R52 Pain, unspecified: Secondary | ICD-10-CM

## 2013-12-26 DIAGNOSIS — Z1231 Encounter for screening mammogram for malignant neoplasm of breast: Secondary | ICD-10-CM

## 2013-12-26 DIAGNOSIS — M7671 Peroneal tendinitis, right leg: Secondary | ICD-10-CM

## 2013-12-26 DIAGNOSIS — M7751 Other enthesopathy of right foot: Secondary | ICD-10-CM

## 2013-12-26 MED ORDER — TRIAMCINOLONE ACETONIDE 10 MG/ML IJ SUSP: 10.0000 mg | Freq: Once | INTRAMUSCULAR | Status: DC

## 2013-12-26 MED ORDER — MELOXICAM 15 MG PO TABS: 15.0000 mg | ORAL_TABLET | Freq: Every day | ORAL | Status: DC

## 2013-12-26 NOTE — Progress Notes (Signed)
   Subjective:    Patient ID: [name], female    DOB: 13-Jan-1950, 63 y.o.   MRN: [id number]  HPI my right foot hurts on the side and going up into the ankle and sore to the touch and has been going on for about 2 weeks and has pain when walking and there is no swelling or no numbness or tingling and no burning or throbbing    Review of Systems no new findings or systemic changes noted     Objective:   Physical Exam 63 year old white female well-developed well-nourished oriented 3 presents at this time proxy 1 year status post surgery on her right foot had undergone bunionectomy procedure clinically everything looks well incision well coapted hallux is rectus forefoot however patient having pain in the midfoot points to the lateral column over the fourth fifth metatarsal base and cuboid articulation site and someone the sinus tarsi region. Bothering her for 2-3 weeks no history of injury or trauma wear his good shoes for the most part maintains a firm shoe no barefoot no flimsy shoes or flip-flops. Neurovascular status is intact pedal pulses are palpable DP and PT +2 over 4 bilateral capillary refill time 3 seconds all digits epicritic and proprioceptive sensations intact and symmetric there is normal plantar response DTRs not listed dermatologic the skin color pigment normal hair growth absent nails unremarkable orthopedic exam there is rectus foot type some tenderness on inversion eversion however most significant pain on direct palpation over the lateral fourth fifth metatarsal base and cuboid articulation site somewhat in the peroneal tendon insertion although not as severe the sinus tarsi on palpation is nonpainful or symptomatic anterior talofibular ligament is intact and stable nonpainful and symptomatic. No open wounds no ulcers no secondary infections x-rays reveal subluxation Lisfranc's for fifth metatarsal base and cuboid no fractures or cyst are noted       Assessment & Plan:    Assessment capsulitis with mild subluxation Lisfranc's for fifth metatarsal base and cuboid articulation posse some marrow. Mild peroneal tendinitis plan at this time injection tendons Kenalog 20 mg Xylocaine plain infiltrated to the calcaneocuboid joint fourth fifth metatarsal base and cuboid as well as is a fascial strapping applied to the left foot patient tolerated this well was given instructions for ice prescription for Modic meloxicam as an NSAID maintaining good stable shoe reappointed in 3-4 weeks for follow-up and reassessment may discuss things like orthoses or additional treatment based on progress or failure to improve  Harriet Masson DPM

## 2013-12-26 NOTE — Patient Instructions (Signed)
ICE INSTRUCTIONS  Apply ice or cold pack to the affected area at least 3 times a day for 10-15 minutes each time.  You should also use ice after prolonged activity or vigorous exercise.  Do not apply ice longer than 20 minutes at one time.  Always keep a cloth between your skin and the ice pack to prevent burns.  Being consistent and following these instructions will help control your symptoms.  We suggest you purchase a gel ice pack because they are reusable and do bit leak.  Some of them are designed to wrap around the area.  Use the method that works best for you.  Here are some other suggestions for icing.   Use a frozen bag of peas or corn-inexpensive and molds well to your body, usually stays frozen for 10 to 20 minutes.  Wet a towel with cold water and squeeze out the excess until it's damp.  Place in a bag in the freezer for 20 minutes. Then remove and use.  Alternate warm compress and ice pack 10 minutes each apply heat and ice repeat heat and ice 2 or 3 times every evening

## 2014-01-16 ENCOUNTER — Ambulatory Visit: Payer: 59

## 2014-01-29 ENCOUNTER — Ambulatory Visit
Admission: RE | Admit: 2014-01-29 | Discharge: 2014-01-29 | Disposition: A | Discharge status: Home / Self Care | Payer: 59 | Source: Ambulatory Visit

## 2014-01-29 DIAGNOSIS — Z1231 Encounter for screening mammogram for malignant neoplasm of breast: Secondary | ICD-10-CM

## 2014-02-13 ENCOUNTER — Ambulatory Visit
Admission: RE | Admit: 2014-02-13 | Discharge: 2014-02-13 | Disposition: A | Discharge status: Home / Self Care | Payer: 59 | Source: Ambulatory Visit | Attending: Family Medicine | Admitting: Family Medicine

## 2014-02-13 ENCOUNTER — Other Ambulatory Visit: Payer: Self-pay | Admitting: Family Medicine

## 2014-02-13 DIAGNOSIS — F17200 Nicotine dependence, unspecified, uncomplicated: Secondary | ICD-10-CM

## 2014-02-19 ENCOUNTER — Other Ambulatory Visit: Payer: Self-pay | Admitting: Family Medicine

## 2014-02-19 ENCOUNTER — Ambulatory Visit
Admission: RE | Admit: 2014-02-19 | Discharge: 2014-02-19 | Disposition: A | Discharge status: Home / Self Care | Payer: 59 | Source: Ambulatory Visit | Attending: Family Medicine | Admitting: Family Medicine

## 2014-02-19 DIAGNOSIS — Z09 Encounter for follow-up examination after completed treatment for conditions other than malignant neoplasm: Secondary | ICD-10-CM

## 2014-09-12 ENCOUNTER — Other Ambulatory Visit: Payer: Self-pay | Admitting: Orthopedic Surgery

## 2015-03-12 ENCOUNTER — Other Ambulatory Visit: Payer: Self-pay

## 2015-03-12 DIAGNOSIS — Z1231 Encounter for screening mammogram for malignant neoplasm of breast: Secondary | ICD-10-CM

## 2015-03-22 DIAGNOSIS — Z0000 Encounter for general adult medical examination without abnormal findings: Secondary | ICD-10-CM | POA: Diagnosis not present

## 2015-03-22 DIAGNOSIS — N183 Chronic kidney disease, stage 3 (moderate): Secondary | ICD-10-CM | POA: Diagnosis not present

## 2015-03-22 DIAGNOSIS — E559 Vitamin D deficiency, unspecified: Secondary | ICD-10-CM | POA: Diagnosis not present

## 2015-03-22 DIAGNOSIS — E890 Postprocedural hypothyroidism: Secondary | ICD-10-CM | POA: Diagnosis not present

## 2015-03-22 DIAGNOSIS — M810 Age-related osteoporosis without current pathological fracture: Secondary | ICD-10-CM | POA: Diagnosis not present

## 2015-03-22 DIAGNOSIS — Z23 Encounter for immunization: Secondary | ICD-10-CM | POA: Diagnosis not present

## 2015-03-22 DIAGNOSIS — K219 Gastro-esophageal reflux disease without esophagitis: Secondary | ICD-10-CM | POA: Diagnosis not present

## 2015-03-22 DIAGNOSIS — F322 Major depressive disorder, single episode, severe without psychotic features: Secondary | ICD-10-CM | POA: Diagnosis not present

## 2015-03-22 DIAGNOSIS — J449 Chronic obstructive pulmonary disease, unspecified: Secondary | ICD-10-CM | POA: Diagnosis not present

## 2015-03-22 DIAGNOSIS — I129 Hypertensive chronic kidney disease with stage 1 through stage 4 chronic kidney disease, or unspecified chronic kidney disease: Secondary | ICD-10-CM | POA: Diagnosis not present

## 2015-03-22 DIAGNOSIS — Z720 Tobacco use: Secondary | ICD-10-CM | POA: Diagnosis not present

## 2015-03-22 DIAGNOSIS — E782 Mixed hyperlipidemia: Secondary | ICD-10-CM | POA: Diagnosis not present

## 2015-03-28 ENCOUNTER — Ambulatory Visit
Admission: RE | Admit: 2015-03-28 | Discharge: 2015-03-28 | Disposition: A | Discharge status: Home / Self Care | Payer: PPO | Source: Ambulatory Visit

## 2015-03-28 DIAGNOSIS — Z1231 Encounter for screening mammogram for malignant neoplasm of breast: Secondary | ICD-10-CM

## 2015-07-11 DIAGNOSIS — M79645 Pain in left finger(s): Secondary | ICD-10-CM | POA: Diagnosis not present

## 2015-07-11 DIAGNOSIS — M79642 Pain in left hand: Secondary | ICD-10-CM | POA: Diagnosis not present

## 2015-07-19 DIAGNOSIS — M7532 Calcific tendinitis of left shoulder: Secondary | ICD-10-CM | POA: Diagnosis not present

## 2015-07-23 DIAGNOSIS — M1812 Unilateral primary osteoarthritis of first carpometacarpal joint, left hand: Secondary | ICD-10-CM | POA: Diagnosis not present

## 2015-09-05 DIAGNOSIS — M1812 Unilateral primary osteoarthritis of first carpometacarpal joint, left hand: Secondary | ICD-10-CM | POA: Diagnosis not present

## 2015-09-05 DIAGNOSIS — M25562 Pain in left knee: Secondary | ICD-10-CM | POA: Diagnosis not present

## 2015-10-08 DIAGNOSIS — M25562 Pain in left knee: Secondary | ICD-10-CM | POA: Diagnosis not present

## 2015-10-08 DIAGNOSIS — E559 Vitamin D deficiency, unspecified: Secondary | ICD-10-CM | POA: Diagnosis not present

## 2015-10-08 DIAGNOSIS — Z720 Tobacco use: Secondary | ICD-10-CM | POA: Diagnosis not present

## 2015-10-08 DIAGNOSIS — N183 Chronic kidney disease, stage 3 (moderate): Secondary | ICD-10-CM | POA: Diagnosis not present

## 2015-10-08 DIAGNOSIS — G8929 Other chronic pain: Secondary | ICD-10-CM | POA: Diagnosis not present

## 2015-10-08 DIAGNOSIS — M810 Age-related osteoporosis without current pathological fracture: Secondary | ICD-10-CM | POA: Diagnosis not present

## 2015-10-08 DIAGNOSIS — F322 Major depressive disorder, single episode, severe without psychotic features: Secondary | ICD-10-CM | POA: Diagnosis not present

## 2015-10-08 DIAGNOSIS — Z23 Encounter for immunization: Secondary | ICD-10-CM | POA: Diagnosis not present

## 2015-10-08 DIAGNOSIS — I129 Hypertensive chronic kidney disease with stage 1 through stage 4 chronic kidney disease, or unspecified chronic kidney disease: Secondary | ICD-10-CM | POA: Diagnosis not present

## 2015-10-08 DIAGNOSIS — J449 Chronic obstructive pulmonary disease, unspecified: Secondary | ICD-10-CM | POA: Diagnosis not present

## 2015-10-08 DIAGNOSIS — E782 Mixed hyperlipidemia: Secondary | ICD-10-CM | POA: Diagnosis not present

## 2015-10-08 DIAGNOSIS — B001 Herpesviral vesicular dermatitis: Secondary | ICD-10-CM | POA: Diagnosis not present

## 2015-10-17 DIAGNOSIS — M25562 Pain in left knee: Secondary | ICD-10-CM | POA: Diagnosis not present

## 2015-10-24 DIAGNOSIS — M25562 Pain in left knee: Secondary | ICD-10-CM | POA: Diagnosis not present

## 2015-10-24 DIAGNOSIS — M23322 Other meniscus derangements, posterior horn of medial meniscus, left knee: Secondary | ICD-10-CM | POA: Diagnosis not present

## 2015-10-31 DIAGNOSIS — D225 Melanocytic nevi of trunk: Secondary | ICD-10-CM | POA: Diagnosis not present

## 2015-10-31 DIAGNOSIS — D1801 Hemangioma of skin and subcutaneous tissue: Secondary | ICD-10-CM | POA: Diagnosis not present

## 2015-10-31 DIAGNOSIS — D2371 Other benign neoplasm of skin of right lower limb, including hip: Secondary | ICD-10-CM | POA: Diagnosis not present

## 2015-10-31 DIAGNOSIS — D2261 Melanocytic nevi of right upper limb, including shoulder: Secondary | ICD-10-CM | POA: Diagnosis not present

## 2015-10-31 DIAGNOSIS — L821 Other seborrheic keratosis: Secondary | ICD-10-CM | POA: Diagnosis not present

## 2015-10-31 DIAGNOSIS — D2262 Melanocytic nevi of left upper limb, including shoulder: Secondary | ICD-10-CM | POA: Diagnosis not present

## 2015-10-31 DIAGNOSIS — L308 Other specified dermatitis: Secondary | ICD-10-CM | POA: Diagnosis not present

## 2015-11-04 DIAGNOSIS — M23332 Other meniscus derangements, other medial meniscus, left knee: Secondary | ICD-10-CM | POA: Diagnosis not present

## 2015-11-21 DIAGNOSIS — M25512 Pain in left shoulder: Secondary | ICD-10-CM | POA: Diagnosis not present

## 2015-11-21 DIAGNOSIS — Y999 Unspecified external cause status: Secondary | ICD-10-CM | POA: Diagnosis not present

## 2015-11-21 DIAGNOSIS — M7552 Bursitis of left shoulder: Secondary | ICD-10-CM | POA: Diagnosis not present

## 2015-11-21 DIAGNOSIS — M84362A Stress fracture, left tibia, initial encounter for fracture: Secondary | ICD-10-CM | POA: Diagnosis not present

## 2015-11-21 DIAGNOSIS — M94262 Chondromalacia, left knee: Secondary | ICD-10-CM | POA: Diagnosis not present

## 2015-11-21 DIAGNOSIS — S83232A Complex tear of medial meniscus, current injury, left knee, initial encounter: Secondary | ICD-10-CM | POA: Diagnosis not present

## 2015-11-21 DIAGNOSIS — M84462A Pathological fracture, left tibia, initial encounter for fracture: Secondary | ICD-10-CM | POA: Diagnosis not present

## 2015-11-21 DIAGNOSIS — M23322 Other meniscus derangements, posterior horn of medial meniscus, left knee: Secondary | ICD-10-CM | POA: Diagnosis not present

## 2015-11-29 DIAGNOSIS — M25562 Pain in left knee: Secondary | ICD-10-CM | POA: Diagnosis not present

## 2015-12-10 DIAGNOSIS — M25562 Pain in left knee: Secondary | ICD-10-CM | POA: Diagnosis not present

## 2015-12-19 DIAGNOSIS — M25562 Pain in left knee: Secondary | ICD-10-CM | POA: Diagnosis not present

## 2016-01-09 DIAGNOSIS — E782 Mixed hyperlipidemia: Secondary | ICD-10-CM | POA: Diagnosis not present

## 2016-01-09 DIAGNOSIS — Z1159 Encounter for screening for other viral diseases: Secondary | ICD-10-CM | POA: Diagnosis not present

## 2016-01-23 DIAGNOSIS — L720 Epidermal cyst: Secondary | ICD-10-CM | POA: Diagnosis not present

## 2016-02-24 ENCOUNTER — Other Ambulatory Visit: Payer: Self-pay | Admitting: Family Medicine

## 2016-02-24 DIAGNOSIS — Z1231 Encounter for screening mammogram for malignant neoplasm of breast: Secondary | ICD-10-CM

## 2016-04-01 DIAGNOSIS — H02834 Dermatochalasis of left upper eyelid: Secondary | ICD-10-CM | POA: Diagnosis not present

## 2016-04-01 DIAGNOSIS — H1045 Other chronic allergic conjunctivitis: Secondary | ICD-10-CM | POA: Diagnosis not present

## 2016-04-01 DIAGNOSIS — H524 Presbyopia: Secondary | ICD-10-CM | POA: Diagnosis not present

## 2016-04-01 DIAGNOSIS — H02831 Dermatochalasis of right upper eyelid: Secondary | ICD-10-CM | POA: Diagnosis not present

## 2016-04-01 DIAGNOSIS — Z961 Presence of intraocular lens: Secondary | ICD-10-CM | POA: Diagnosis not present

## 2016-04-02 ENCOUNTER — Ambulatory Visit
Admission: RE | Admit: 2016-04-02 | Discharge: 2016-04-02 | Disposition: A | Discharge status: Home / Self Care | Payer: PPO | Source: Ambulatory Visit | Attending: Family Medicine | Admitting: Family Medicine

## 2016-04-02 DIAGNOSIS — Z1231 Encounter for screening mammogram for malignant neoplasm of breast: Secondary | ICD-10-CM

## 2016-04-07 DIAGNOSIS — M810 Age-related osteoporosis without current pathological fracture: Secondary | ICD-10-CM | POA: Diagnosis not present

## 2016-05-11 DIAGNOSIS — J069 Acute upper respiratory infection, unspecified: Secondary | ICD-10-CM | POA: Diagnosis not present

## 2016-05-11 DIAGNOSIS — R05 Cough: Secondary | ICD-10-CM | POA: Diagnosis not present

## 2016-05-21 DIAGNOSIS — E782 Mixed hyperlipidemia: Secondary | ICD-10-CM | POA: Diagnosis not present

## 2016-05-21 DIAGNOSIS — Z1211 Encounter for screening for malignant neoplasm of colon: Secondary | ICD-10-CM | POA: Diagnosis not present

## 2016-05-21 DIAGNOSIS — N183 Chronic kidney disease, stage 3 (moderate): Secondary | ICD-10-CM | POA: Diagnosis not present

## 2016-05-21 DIAGNOSIS — M810 Age-related osteoporosis without current pathological fracture: Secondary | ICD-10-CM | POA: Diagnosis not present

## 2016-05-21 DIAGNOSIS — Z720 Tobacco use: Secondary | ICD-10-CM | POA: Diagnosis not present

## 2016-05-21 DIAGNOSIS — K219 Gastro-esophageal reflux disease without esophagitis: Secondary | ICD-10-CM | POA: Diagnosis not present

## 2016-05-21 DIAGNOSIS — Z23 Encounter for immunization: Secondary | ICD-10-CM | POA: Diagnosis not present

## 2016-05-21 DIAGNOSIS — E559 Vitamin D deficiency, unspecified: Secondary | ICD-10-CM | POA: Diagnosis not present

## 2016-05-21 DIAGNOSIS — E890 Postprocedural hypothyroidism: Secondary | ICD-10-CM | POA: Diagnosis not present

## 2016-05-21 DIAGNOSIS — Z0000 Encounter for general adult medical examination without abnormal findings: Secondary | ICD-10-CM | POA: Diagnosis not present

## 2016-05-21 DIAGNOSIS — F322 Major depressive disorder, single episode, severe without psychotic features: Secondary | ICD-10-CM | POA: Diagnosis not present

## 2016-05-21 DIAGNOSIS — I129 Hypertensive chronic kidney disease with stage 1 through stage 4 chronic kidney disease, or unspecified chronic kidney disease: Secondary | ICD-10-CM | POA: Diagnosis not present

## 2016-06-01 DIAGNOSIS — Z1211 Encounter for screening for malignant neoplasm of colon: Secondary | ICD-10-CM | POA: Diagnosis not present

## 2016-07-07 DIAGNOSIS — M810 Age-related osteoporosis without current pathological fracture: Secondary | ICD-10-CM | POA: Diagnosis not present

## 2016-07-13 DIAGNOSIS — M23322 Other meniscus derangements, posterior horn of medial meniscus, left knee: Secondary | ICD-10-CM | POA: Diagnosis not present

## 2016-08-21 DIAGNOSIS — E782 Mixed hyperlipidemia: Secondary | ICD-10-CM | POA: Diagnosis not present

## 2016-09-11 DIAGNOSIS — M23322 Other meniscus derangements, posterior horn of medial meniscus, left knee: Secondary | ICD-10-CM | POA: Diagnosis not present

## 2016-09-11 DIAGNOSIS — M1712 Unilateral primary osteoarthritis, left knee: Secondary | ICD-10-CM | POA: Diagnosis not present

## 2016-09-25 DIAGNOSIS — Z23 Encounter for immunization: Secondary | ICD-10-CM | POA: Diagnosis not present

## 2016-09-30 DIAGNOSIS — H16223 Keratoconjunctivitis sicca, not specified as Sjogren's, bilateral: Secondary | ICD-10-CM | POA: Diagnosis not present

## 2016-10-09 DIAGNOSIS — M810 Age-related osteoporosis without current pathological fracture: Secondary | ICD-10-CM | POA: Diagnosis not present

## 2016-11-18 DIAGNOSIS — M1712 Unilateral primary osteoarthritis, left knee: Secondary | ICD-10-CM | POA: Diagnosis not present

## 2016-12-02 DIAGNOSIS — E890 Postprocedural hypothyroidism: Secondary | ICD-10-CM | POA: Diagnosis not present

## 2016-12-02 DIAGNOSIS — E782 Mixed hyperlipidemia: Secondary | ICD-10-CM | POA: Diagnosis not present

## 2016-12-02 DIAGNOSIS — I129 Hypertensive chronic kidney disease with stage 1 through stage 4 chronic kidney disease, or unspecified chronic kidney disease: Secondary | ICD-10-CM | POA: Diagnosis not present

## 2016-12-02 DIAGNOSIS — J449 Chronic obstructive pulmonary disease, unspecified: Secondary | ICD-10-CM | POA: Diagnosis not present

## 2016-12-02 DIAGNOSIS — N183 Chronic kidney disease, stage 3 (moderate): Secondary | ICD-10-CM | POA: Diagnosis not present

## 2016-12-02 DIAGNOSIS — Z720 Tobacco use: Secondary | ICD-10-CM | POA: Diagnosis not present

## 2016-12-02 DIAGNOSIS — F322 Major depressive disorder, single episode, severe without psychotic features: Secondary | ICD-10-CM | POA: Diagnosis not present

## 2017-01-13 DIAGNOSIS — M1712 Unilateral primary osteoarthritis, left knee: Secondary | ICD-10-CM | POA: Diagnosis not present

## 2017-01-13 DIAGNOSIS — Z7689 Persons encountering health services in other specified circumstances: Secondary | ICD-10-CM | POA: Diagnosis not present

## 2017-01-16 DIAGNOSIS — J069 Acute upper respiratory infection, unspecified: Secondary | ICD-10-CM | POA: Diagnosis not present

## 2017-01-20 DIAGNOSIS — J069 Acute upper respiratory infection, unspecified: Secondary | ICD-10-CM | POA: Diagnosis not present

## 2017-02-10 DIAGNOSIS — M25562 Pain in left knee: Secondary | ICD-10-CM | POA: Diagnosis not present

## 2017-02-10 DIAGNOSIS — M1712 Unilateral primary osteoarthritis, left knee: Secondary | ICD-10-CM | POA: Diagnosis not present

## 2017-02-26 DIAGNOSIS — M1712 Unilateral primary osteoarthritis, left knee: Secondary | ICD-10-CM | POA: Diagnosis not present

## 2017-03-04 DIAGNOSIS — G8918 Other acute postprocedural pain: Secondary | ICD-10-CM | POA: Diagnosis not present

## 2017-03-04 DIAGNOSIS — M1712 Unilateral primary osteoarthritis, left knee: Secondary | ICD-10-CM | POA: Diagnosis not present

## 2017-03-11 DIAGNOSIS — M25562 Pain in left knee: Secondary | ICD-10-CM | POA: Diagnosis not present

## 2017-03-12 ENCOUNTER — Other Ambulatory Visit: Payer: Self-pay | Admitting: Family Medicine

## 2017-03-12 DIAGNOSIS — Z1231 Encounter for screening mammogram for malignant neoplasm of breast: Secondary | ICD-10-CM

## 2017-03-15 DIAGNOSIS — M25562 Pain in left knee: Secondary | ICD-10-CM | POA: Diagnosis not present

## 2017-03-19 DIAGNOSIS — M25562 Pain in left knee: Secondary | ICD-10-CM | POA: Diagnosis not present

## 2017-03-22 DIAGNOSIS — M25562 Pain in left knee: Secondary | ICD-10-CM | POA: Diagnosis not present

## 2017-03-25 DIAGNOSIS — M25562 Pain in left knee: Secondary | ICD-10-CM | POA: Diagnosis not present

## 2017-03-29 DIAGNOSIS — M25562 Pain in left knee: Secondary | ICD-10-CM | POA: Diagnosis not present

## 2017-04-01 DIAGNOSIS — M25562 Pain in left knee: Secondary | ICD-10-CM | POA: Diagnosis not present

## 2017-04-05 DIAGNOSIS — H524 Presbyopia: Secondary | ICD-10-CM | POA: Diagnosis not present

## 2017-04-05 DIAGNOSIS — M25562 Pain in left knee: Secondary | ICD-10-CM | POA: Diagnosis not present

## 2017-04-05 DIAGNOSIS — H52223 Regular astigmatism, bilateral: Secondary | ICD-10-CM | POA: Diagnosis not present

## 2017-04-05 DIAGNOSIS — H02834 Dermatochalasis of left upper eyelid: Secondary | ICD-10-CM | POA: Diagnosis not present

## 2017-04-05 DIAGNOSIS — H16223 Keratoconjunctivitis sicca, not specified as Sjogren's, bilateral: Secondary | ICD-10-CM | POA: Diagnosis not present

## 2017-04-05 DIAGNOSIS — H5202 Hypermetropia, left eye: Secondary | ICD-10-CM | POA: Diagnosis not present

## 2017-04-05 DIAGNOSIS — H02831 Dermatochalasis of right upper eyelid: Secondary | ICD-10-CM | POA: Diagnosis not present

## 2017-04-08 DIAGNOSIS — M25562 Pain in left knee: Secondary | ICD-10-CM | POA: Diagnosis not present

## 2017-04-09 DIAGNOSIS — M810 Age-related osteoporosis without current pathological fracture: Secondary | ICD-10-CM | POA: Diagnosis not present

## 2017-04-12 ENCOUNTER — Ambulatory Visit
Admission: RE | Admit: 2017-04-12 | Discharge: 2017-04-12 | Disposition: A | Discharge status: Home / Self Care | Payer: PRIVATE HEALTH INSURANCE | Source: Ambulatory Visit | Attending: Family Medicine | Admitting: Family Medicine

## 2017-04-12 DIAGNOSIS — Z1231 Encounter for screening mammogram for malignant neoplasm of breast: Secondary | ICD-10-CM | POA: Diagnosis not present

## 2017-04-13 DIAGNOSIS — M25562 Pain in left knee: Secondary | ICD-10-CM | POA: Diagnosis not present

## 2017-04-21 DIAGNOSIS — Z96652 Presence of left artificial knee joint: Secondary | ICD-10-CM | POA: Diagnosis not present

## 2017-04-21 DIAGNOSIS — Z471 Aftercare following joint replacement surgery: Secondary | ICD-10-CM | POA: Diagnosis not present

## 2017-05-18 DIAGNOSIS — J069 Acute upper respiratory infection, unspecified: Secondary | ICD-10-CM | POA: Diagnosis not present

## 2017-06-17 DIAGNOSIS — M810 Age-related osteoporosis without current pathological fracture: Secondary | ICD-10-CM | POA: Diagnosis not present

## 2017-06-17 DIAGNOSIS — J449 Chronic obstructive pulmonary disease, unspecified: Secondary | ICD-10-CM | POA: Diagnosis not present

## 2017-06-17 DIAGNOSIS — E890 Postprocedural hypothyroidism: Secondary | ICD-10-CM | POA: Diagnosis not present

## 2017-06-17 DIAGNOSIS — E559 Vitamin D deficiency, unspecified: Secondary | ICD-10-CM | POA: Diagnosis not present

## 2017-06-17 DIAGNOSIS — I129 Hypertensive chronic kidney disease with stage 1 through stage 4 chronic kidney disease, or unspecified chronic kidney disease: Secondary | ICD-10-CM | POA: Diagnosis not present

## 2017-06-17 DIAGNOSIS — F322 Major depressive disorder, single episode, severe without psychotic features: Secondary | ICD-10-CM | POA: Diagnosis not present

## 2017-06-17 DIAGNOSIS — N183 Chronic kidney disease, stage 3 (moderate): Secondary | ICD-10-CM | POA: Diagnosis not present

## 2017-06-17 DIAGNOSIS — Z720 Tobacco use: Secondary | ICD-10-CM | POA: Diagnosis not present

## 2017-06-17 DIAGNOSIS — E782 Mixed hyperlipidemia: Secondary | ICD-10-CM | POA: Diagnosis not present

## 2017-06-17 DIAGNOSIS — Z0000 Encounter for general adult medical examination without abnormal findings: Secondary | ICD-10-CM | POA: Diagnosis not present

## 2017-07-02 DIAGNOSIS — M238X2 Other internal derangements of left knee: Secondary | ICD-10-CM | POA: Diagnosis not present

## 2017-07-02 DIAGNOSIS — M25562 Pain in left knee: Secondary | ICD-10-CM | POA: Diagnosis not present

## 2017-08-10 DIAGNOSIS — H02834 Dermatochalasis of left upper eyelid: Secondary | ICD-10-CM | POA: Diagnosis not present

## 2017-08-10 DIAGNOSIS — H02831 Dermatochalasis of right upper eyelid: Secondary | ICD-10-CM | POA: Diagnosis not present

## 2017-08-19 DIAGNOSIS — H02413 Mechanical ptosis of bilateral eyelids: Secondary | ICD-10-CM | POA: Diagnosis not present

## 2017-08-23 DIAGNOSIS — H02413 Mechanical ptosis of bilateral eyelids: Secondary | ICD-10-CM | POA: Diagnosis not present

## 2017-08-23 DIAGNOSIS — Z01818 Encounter for other preprocedural examination: Secondary | ICD-10-CM | POA: Diagnosis not present

## 2017-09-15 DIAGNOSIS — H02412 Mechanical ptosis of left eyelid: Secondary | ICD-10-CM | POA: Diagnosis not present

## 2017-09-15 DIAGNOSIS — H02403 Unspecified ptosis of bilateral eyelids: Secondary | ICD-10-CM | POA: Diagnosis not present

## 2017-09-15 DIAGNOSIS — H02413 Mechanical ptosis of bilateral eyelids: Secondary | ICD-10-CM | POA: Diagnosis not present

## 2017-09-15 DIAGNOSIS — H02411 Mechanical ptosis of right eyelid: Secondary | ICD-10-CM | POA: Diagnosis not present

## 2017-09-20 DIAGNOSIS — Z23 Encounter for immunization: Secondary | ICD-10-CM | POA: Diagnosis not present

## 2017-09-20 DIAGNOSIS — R809 Proteinuria, unspecified: Secondary | ICD-10-CM | POA: Diagnosis not present

## 2017-11-22 DIAGNOSIS — M810 Age-related osteoporosis without current pathological fracture: Secondary | ICD-10-CM | POA: Diagnosis not present

## 2017-12-30 DIAGNOSIS — F322 Major depressive disorder, single episode, severe without psychotic features: Secondary | ICD-10-CM | POA: Diagnosis not present

## 2017-12-30 DIAGNOSIS — E782 Mixed hyperlipidemia: Secondary | ICD-10-CM | POA: Diagnosis not present

## 2017-12-30 DIAGNOSIS — J449 Chronic obstructive pulmonary disease, unspecified: Secondary | ICD-10-CM | POA: Diagnosis not present

## 2017-12-30 DIAGNOSIS — N183 Chronic kidney disease, stage 3 (moderate): Secondary | ICD-10-CM | POA: Diagnosis not present

## 2017-12-30 DIAGNOSIS — I129 Hypertensive chronic kidney disease with stage 1 through stage 4 chronic kidney disease, or unspecified chronic kidney disease: Secondary | ICD-10-CM | POA: Diagnosis not present

## 2017-12-30 DIAGNOSIS — R319 Hematuria, unspecified: Secondary | ICD-10-CM | POA: Diagnosis not present

## 2017-12-30 DIAGNOSIS — Z720 Tobacco use: Secondary | ICD-10-CM | POA: Diagnosis not present

## 2018-01-19 DIAGNOSIS — J40 Bronchitis, not specified as acute or chronic: Secondary | ICD-10-CM | POA: Diagnosis not present

## 2018-01-19 DIAGNOSIS — J069 Acute upper respiratory infection, unspecified: Secondary | ICD-10-CM | POA: Diagnosis not present

## 2018-01-19 DIAGNOSIS — Z720 Tobacco use: Secondary | ICD-10-CM | POA: Diagnosis not present

## 2018-01-19 DIAGNOSIS — R05 Cough: Secondary | ICD-10-CM | POA: Diagnosis not present

## 2018-02-10 DIAGNOSIS — R3121 Asymptomatic microscopic hematuria: Secondary | ICD-10-CM | POA: Diagnosis not present

## 2018-02-10 DIAGNOSIS — N393 Stress incontinence (female) (male): Secondary | ICD-10-CM | POA: Diagnosis not present

## 2018-02-10 DIAGNOSIS — N281 Cyst of kidney, acquired: Secondary | ICD-10-CM | POA: Diagnosis not present

## 2018-02-21 DIAGNOSIS — R3121 Asymptomatic microscopic hematuria: Secondary | ICD-10-CM | POA: Diagnosis not present

## 2018-02-21 DIAGNOSIS — R319 Hematuria, unspecified: Secondary | ICD-10-CM | POA: Diagnosis not present

## 2018-02-28 DIAGNOSIS — N281 Cyst of kidney, acquired: Secondary | ICD-10-CM | POA: Diagnosis not present

## 2018-02-28 DIAGNOSIS — N393 Stress incontinence (female) (male): Secondary | ICD-10-CM | POA: Diagnosis not present

## 2018-02-28 DIAGNOSIS — R3121 Asymptomatic microscopic hematuria: Secondary | ICD-10-CM | POA: Diagnosis not present

## 2018-04-07 DIAGNOSIS — H05011 Cellulitis of right orbit: Secondary | ICD-10-CM | POA: Diagnosis not present

## 2018-04-08 DIAGNOSIS — L239 Allergic contact dermatitis, unspecified cause: Secondary | ICD-10-CM | POA: Diagnosis not present

## 2018-04-12 ENCOUNTER — Other Ambulatory Visit: Payer: Self-pay | Admitting: Family Medicine

## 2018-04-12 DIAGNOSIS — Z1231 Encounter for screening mammogram for malignant neoplasm of breast: Secondary | ICD-10-CM

## 2018-05-18 DIAGNOSIS — H26493 Other secondary cataract, bilateral: Secondary | ICD-10-CM | POA: Diagnosis not present

## 2018-05-18 DIAGNOSIS — H16223 Keratoconjunctivitis sicca, not specified as Sjogren's, bilateral: Secondary | ICD-10-CM | POA: Diagnosis not present

## 2018-06-08 ENCOUNTER — Other Ambulatory Visit: Payer: Self-pay

## 2018-06-08 ENCOUNTER — Ambulatory Visit
Admission: RE | Admit: 2018-06-08 | Discharge: 2018-06-08 | Disposition: A | Discharge status: Home / Self Care | Payer: PRIVATE HEALTH INSURANCE | Source: Ambulatory Visit | Attending: Family Medicine | Admitting: Family Medicine

## 2018-06-08 DIAGNOSIS — Z1231 Encounter for screening mammogram for malignant neoplasm of breast: Secondary | ICD-10-CM | POA: Diagnosis not present

## 2018-06-29 ENCOUNTER — Other Ambulatory Visit: Payer: Self-pay | Admitting: Family Medicine

## 2018-06-29 DIAGNOSIS — Z7189 Other specified counseling: Secondary | ICD-10-CM | POA: Diagnosis not present

## 2018-06-29 DIAGNOSIS — R229 Localized swelling, mass and lump, unspecified: Secondary | ICD-10-CM

## 2018-06-30 ENCOUNTER — Ambulatory Visit (HOSPITAL_BASED_OUTPATIENT_CLINIC_OR_DEPARTMENT_OTHER)
Admission: RE | Admit: 2018-06-30 | Discharge: 2018-06-30 | Disposition: A | Discharge status: Home / Self Care | Payer: PPO | Source: Ambulatory Visit | Attending: Family Medicine | Admitting: Family Medicine

## 2018-06-30 ENCOUNTER — Other Ambulatory Visit: Payer: Self-pay

## 2018-06-30 DIAGNOSIS — R229 Localized swelling, mass and lump, unspecified: Secondary | ICD-10-CM | POA: Insufficient documentation

## 2018-06-30 DIAGNOSIS — R911 Solitary pulmonary nodule: Secondary | ICD-10-CM | POA: Diagnosis not present

## 2018-07-06 ENCOUNTER — Ambulatory Visit: Payer: Self-pay | Admitting: Surgery

## 2018-07-06 DIAGNOSIS — R2241 Localized swelling, mass and lump, right lower limb: Secondary | ICD-10-CM | POA: Diagnosis not present

## 2018-07-06 DIAGNOSIS — Z720 Tobacco use: Secondary | ICD-10-CM | POA: Diagnosis not present

## 2018-07-06 DIAGNOSIS — R222 Localized swelling, mass and lump, trunk: Secondary | ICD-10-CM | POA: Diagnosis not present

## 2018-07-06 NOTE — H&P (Signed)
Kimberly [name] Documented: 07/06/2018 4:16 PM Location: [facility] Patient #: [id number] DOB: 26-Aug-1950 Married / Language: Cleophus Molt / Race: White Female  History of Present Illness Adin Hector MD; 07/06/2018 5:34 PM) The patient is a 68 year old female who presents with a soft tissue mass. Note for "Soft tissue mass": ` ` ` Patient sent for surgical consultation at the request of Dr Derrill Memo  Chief Complaint: Recurrent subcutaneous nodules in a patient with a history of angiolipomas and lipomas. ` ` The patient is a pleasant woman that has been followed by our group for several decades. She gets painful subcutaneous nodules. They crop up mostly on her trunk and thighs. Usually gets to the point of being very painful and uncomfortable. She had excisions in 2008, 2012, 2015. Last one was a neck mass removed by me in 2015 - a lipoma. Pathology consistent with lipoma.  Patient notes that she feels a couple small but painful hard nodules on her right abdomen/flank. Anterior axillary line and midaxillary line. She also feels pain on her left posterior lateral chest wall below her bra line. Wrapping around the side. They have gradually gotten larger and become more sensitive. She has another lesion on her anterior right thigh but that has not been painful or bothersome. She is hoping for removal of the more painful right-sided lesions in the enlarging left chest wall lesion. She and ultrasound that suspected lipomas. She continues to smoke. However she is not diabetic. She is not on blood thinners.  (Review of systems as stated in this history (HPI) or in the review of systems. Otherwise all other 12 point ROS are negative) ` ` `   Diagnosis Soft tissue, lipoma, posterior neck mass - MATURE ADIPOSE, CONSISTENT WITH LIPOMA. Vicente Males MD Pathologist, Electronic Signature (Case signed 09/29/2013) Specimen Kimberly [name] and Clinical Information Specimen(s) Obtained:  Soft tissue, lipoma, posterior neck mass Specimen Clinical Information neck mass (kp) Kimberly [name] The specimen is received in formalin and consists of a 4.5 x 2.4 x 1.6 cm aggregate of tan yellow adipose tissue and tan pink, focally cauterized soft tissue. Sectioning reveals yellow lobulated adipose tissue with tan red muscular tissue. Representative sections are submitted in one cassette. (KL:kh 09-28-13) Report signed out from the following location(s) Technical Component performed at [facility]. [address],[place] [address].[id number]., Interpretation performed at [place], [place], [place] [address]. CLIA #: [id number],   Past Surgical History Andreas Blower, [place]; 07/06/2018 4:16 PM) Appendectomy Breast Biopsy Bilateral. multiple Cataract Surgery Bilateral. Colon Polyp Removal - Colonoscopy Hemorrhoidectomy Hysterectomy (not due to cancer) - Partial Knee Surgery Left.  Diagnostic Studies History Andreas Blower, [place]; 07/06/2018 4:16 PM) Colonoscopy 1-5 years ago Mammogram within last year Pap Smear 1-5 years ago  Medication History Andreas Blower, CMA; 07/06/2018 4:19 PM) traMADol HCl (50MG  Tablet, Oral) Active. Co Q-10 (100MG  Capsule, Oral) Active. Magnesium (200MG  Tablet, Oral) Active. Oscal 500/200 D-3 (500-200MG -UNIT Tablet, Oral) Active. valACYclovir HCl (1GM Tablet, Oral) Active. Vitamin D (1000UNIT Capsule, Oral) Active. Venlafaxine HCl (75MG  Tablet, Oral) Active. Rosuvastatin Calcium (20MG  Tablet, Oral) Active. Lisinopril (20MG  Tablet, Oral daily) Active. Venlafaxine HCl ER (75MG  Capsule ER 24HR, Oral daily) Active. Aspirin Low Strength (81MG  Tablet Chewable, Oral daily) Active. Vitamin D (Cholecalciferol) (1000UNIT Tablet, Oral daily) Active. Medications Reconciled  Social History Andreas Blower, CMA; 07/06/2018 4:16 PM) Alcohol use Occasional alcohol use.  Caffeine use Coffee. No drug use Tobacco use Current every day smoker.  Family History (Armen  Glo Herring, CMA; 07/06/2018 4:16 PM) Alcohol Abuse Sister. Cancer Brother. Colon Cancer Family Members In General. Diabetes Mellitus Family Members In General, Father. Heart Disease Father. Heart disease in female family member before age 67 Hypertension Father.  Pregnancy / Birth History Andreas Blower, [place]; 07/06/2018 4:16 PM) Length (months) of breastfeeding 3-6 Maternal age 32-25 Para 2  Other Problems Andreas Blower, [place]; 07/06/2018 4:16 PM) Anxiety Disorder Arthritis Chronic Obstructive Lung Disease Depression Hemorrhoids High blood pressure Hypercholesterolemia     Review of Systems (Armen Ferguson CMA; 07/06/2018 4:16 PM) Respiratory Present- Wheezing. Not Present- Bloody sputum, Chronic Cough, Difficulty Breathing and Snoring. Gastrointestinal Not Present- Abdominal Pain, Bloating, Bloody Stool, Change in Bowel Habits, Chronic diarrhea, Constipation, Difficulty Swallowing, Excessive gas, Gets full quickly at meals, Hemorrhoids, Indigestion, Nausea, Rectal Pain and Vomiting. Female Genitourinary Present- Frequency and Urgency. Not Present- Nocturia, Painful Urination and Pelvic Pain. Musculoskeletal Present- Joint Stiffness and Swelling of Extremities. Not Present- Back Pain, Joint Pain, Muscle Pain and Muscle Weakness. Neurological Not Present- Decreased Memory, Fainting, Headaches, Numbness, Seizures, Tingling, Tremor, Trouble walking and Weakness. Psychiatric Present- Anxiety. Not Present- Bipolar, Change in Sleep Pattern, Depression, Fearful and Frequent crying. Hematology Not Present- Blood Thinners, Easy Bruising, Excessive bleeding, Gland problems, HIV and Persistent Infections.  Vitals (Armen Ferguson CMA; 07/06/2018 4:16 PM) 07/06/2018 4:16 PM Weight: 128.5 lb Height: 58in Body Surface Area: 1.51 m Body Mass Index: 26.86 kg/m  Temp.: 98.29F   Pulse: 101 (Regular)  P.OX: 89% (Room air) BP: 150/86 (Sitting, Left Arm, Standard)        Physical Exam Adin Hector MD; 07/06/2018 4:40 PM)  General Mental Status-Alert. General Appearance-Not in acute distress, Not Sickly. Orientation-Oriented X3. Hydration-Well hydrated. Voice-Normal.  Integumentary Global Assessment Upon inspection and palpation of skin surfaces of the - Axillae: non-tender, no inflammation or ulceration, no drainage. and Distribution of scalp and body hair is normal. General Characteristics Temperature - normal warmth is noted.  Head and Neck Head-normocephalic, atraumatic with no lesions or palpable masses. Face Global Assessment - atraumatic, no absence of expression. Neck Global Assessment - no abnormal movements, no bruit auscultated on the right, no bruit auscultated on the left, no decreased range of motion, non-tender. Trachea-midline. Thyroid Gland Characteristics - non-tender.  Eye Eyeball - Left-Extraocular movements intact, No Nystagmus - Left. Eyeball - Right-Extraocular movements intact, No Nystagmus - Right. Cornea - Left-No Hazy - Left. Cornea - Right-No Hazy - Right. Sclera/Conjunctiva - Left-No scleral icterus, No Discharge - Left. Sclera/Conjunctiva - Right-No scleral icterus, No Discharge - Right. Pupil - Left-Direct reaction to light normal. Pupil - Right-Direct reaction to light normal.  ENMT Ears Pinna - Left - no drainage observed, no generalized tenderness observed. Pinna - Right - no drainage observed, no generalized tenderness observed. Nose and Sinuses External Inspection of the Nose - no destructive lesion observed. Inspection of the nares - Left - quiet respiration. Inspection of the nares - Right - quiet respiration. Mouth and Throat Lips - Upper Lip - no fissures observed, no pallor noted. Lower Lip - no fissures observed, no pallor noted. Nasopharynx - no discharge present.  Oral Cavity/Oropharynx - Tongue - no dryness observed. Oral Mucosa - no cyanosis observed. Hypopharynx - no evidence of airway distress observed.  Chest and Lung Exam Inspection Movements - Normal and Symmetrical. Accessory muscles - No use of accessory muscles in breathing. Palpation Palpation of the chest reveals - Non-tender. Auscultation Breath sounds - Normal and Clear. Note: On the left posterior lateral chest wall 9 x  3 cm region of swelling in ellipsoid masses adherent to the rib cage. Suspicious for lipomas  Cardiovascular Auscultation Rhythm - Regular. Murmurs & Other Heart Sounds - Auscultation of the heart reveals - No Murmurs and No Systolic Clicks.  Abdomen Inspection Inspection of the abdomen reveals - No Visible peristalsis and No Abnormal pulsations. Umbilicus - No Bleeding, No Urine drainage. Palpation/Percussion Palpation and Percussion of the abdomen reveal - Soft, Non Tender, No Rebound tenderness, No Rigidity (guarding) and No Cutaneous hyperesthesia. Note: At the left flank anterior axillary line and posterior axillary line are 1-2 centimeter subcutaneous nodules. Sensitive and firm. Not inflamed or phlegmonous.  Abdomen obese but soft. Not severely distended. No diastasis recti. No umbilical or other anterior abdominal wall hernias  Female Genitourinary Sexual Maturity Tanner 5 - Adult hair pattern. Note: No vaginal bleeding nor discharge  Peripheral Vascular Upper Extremity Inspection - Left - No Cyanotic nailbeds - Left, Not Ischemic. Inspection - Right - No Cyanotic nailbeds - Right, Not Ischemic.  Neurologic Neurologic evaluation reveals -normal attention span and ability to concentrate, able to name objects and repeat phrases. Appropriate fund of knowledge , normal sensation and normal coordination. Mental Status Affect - not angry, not paranoid. Cranial Nerves-Normal Bilaterally. Gait-Normal.  Neuropsychiatric Mental status exam  performed with findings of-able to articulate well with normal speech/language, rate, volume and coherence, thought content normal with ability to perform basic computations and apply abstract reasoning and no evidence of hallucinations, delusions, obsessions or homicidal/suicidal ideation.  Musculoskeletal Global Assessment Spine, Ribs and Pelvis - no instability, subluxation or laxity. Right Upper Extremity - no instability, subluxation or laxity. Note: On anterior proximal right thigh there is a 2.5 cm subcutaneous nodule consistent with a lipoma.  Lymphatic Head & Neck  General Head & Neck Lymphatics: Bilateral - Description - No Localized lymphadenopathy. Axillary  General Axillary Region: Bilateral - Description - No Localized lymphadenopathy. Femoral & Inguinal  Generalized Femoral & Inguinal Lymphatics: Left - Description - No Localized lymphadenopathy. Right - Description - No Localized lymphadenopathy.    Assessment & Plan Adin Hector MD; 07/06/2018 4:44 PM)  SUBCUTANEOUS MASS OF ABDOMINAL WALL (R22.2) Impression: Firm subcutaneous nodules of right flank 2. New since 2015. History of angiolipomas. I think he would benefit from excision. She agrees   MASS OF CHEST WALL, LEFT (R22.2) Impression: Soft ellipsoid masses fixed to left lateral mid chest wall in a patient with a history of lipomas angiolipomas. A 0 becoming uncomfortable and larger, reasonable removed. These rather deep near the chest wall, so I think I would require at least deep sedation to do this.  Current Plans You are being scheduled for surgery- Our schedulers will call you.  You should hear from our office's scheduling department within 5 working days about the location, date, and time of surgery. We try to make accommodations for patient's preferences in scheduling surgery, but sometimes the OR schedule or the surgeon's schedule prevents [place] from making those accommodations.  If you have not  heard from our office [phone]) in 5 working days, call the office and ask for your surgeon's nurse.  If you have other questions about your diagnosis, plan, or surgery, call the office and ask for your surgeon's nurse.  The pathophysiology of skin & subcutaneous masses was discussed. Natural history risks without surgery were discussed. I recommended surgery to remove the mass. I explained the technique of removal with use of local anesthesia & possible need for more aggressive sedation/anesthesia for patient comfort.  Risks such as bleeding, infection, wound breakdown, heart attack, death, and other risks were discussed. I noted a good likelihood this will help address the problem. Possibility that this will not correct all symptoms was explained. Possibility of regrowth/recurrence of the mass was discussed. We will work to minimize complications. Questions were answered. The patient expresses understanding & wishes to proceed with surgery.   MASS OF SOFT TISSUE OF RIGHT LOWER EXTREMITY (R22.41) Impression: Ellipsoid mass on right anterior thigh. Not physically painful or change in size. Hold off on removal unless things change. She agrees with observation only on that since it is not painful like the other lesions on her chest wall and flank   TOBACCO ABUSE (Z72.0)  Current Plans Pt Education - [facility] STOP SMOKING!  Adin Hector, MD, FACS, MASCRS Gastrointestinal and Minimally Invasive Surgery    [address], [place], [place] [address] [phone] Main / Paging [phone] Fax

## 2018-07-06 NOTE — H&P (View-Only) (Signed)
Oneita [name] Documented: 07/06/2018 4:16 PM Location: [facility] Patient #: [id number] DOB: [birth year] Married / Language: Kimberly [name] / Race: White Female  History of Present Illness Adin Hector MD; 07/06/2018 5:34 PM) The patient is a 68 year old female who presents with a soft tissue mass. Note for "Soft tissue mass": ` ` ` Patient sent for surgical consultation at the request of Dr Derrill Memo  Chief Complaint: Recurrent subcutaneous nodules in a patient with a history of angiolipomas and lipomas. ` ` The patient is a pleasant woman that has been followed by our group for several decades. She gets painful subcutaneous nodules. They crop up mostly on her trunk and thighs. Usually gets to the point of being very painful and uncomfortable. She had excisions in 2008, 2012, 2015. Last one was a neck mass removed by me in 2015 - a lipoma. Pathology consistent with lipoma.  Patient notes that she feels a couple small but painful hard nodules on her right abdomen/flank. Anterior axillary line and midaxillary line. She also feels pain on her left posterior lateral chest wall below her bra line. Wrapping around the side. They have gradually gotten larger and become more sensitive. She has another lesion on her anterior right thigh but that has not been painful or bothersome. She is hoping for removal of the more painful right-sided lesions in the enlarging left chest wall lesion. She and ultrasound that suspected lipomas. She continues to smoke. However she is not diabetic. She is not on blood thinners.  (Review of systems as stated in this history (HPI) or in the review of systems. Otherwise all other 12 point ROS are negative) ` ` `   Diagnosis Soft tissue, lipoma, posterior neck mass - MATURE ADIPOSE, CONSISTENT WITH LIPOMA. Kimberly Males MD Pathologist, Electronic Signature (Case signed 09/29/2013) Specimen Kimberly [name] and Clinical Information Specimen(s) Obtained:  Soft tissue, lipoma, posterior neck mass Specimen Clinical Information neck mass (kp) Kimberly [name] The specimen is received in formalin and consists of a 4.5 x 2.4 x 1.6 cm aggregate of tan yellow adipose tissue and tan pink, focally cauterized soft tissue. Sectioning reveals yellow lobulated adipose tissue with tan red muscular tissue. Representative sections are submitted in one cassette. (KL:kh 09-28-13) Report signed out from the following location(s) Technical Component performed at [facility]. [address],[place] [address].[id number]., Interpretation performed at [facility], [place], [place] [address]. CLIA #: [id number],   Past Surgical History Kimberly [name], [place]; 07/06/2018 4:16 PM) Appendectomy Breast Biopsy Bilateral. multiple Cataract Surgery Bilateral. Colon Polyp Removal - Colonoscopy Hemorrhoidectomy Hysterectomy (not due to cancer) - Partial Knee Surgery Left.  Diagnostic Studies History Kimberly [name], [place]; 07/06/2018 4:16 PM) Colonoscopy 1-5 years ago Mammogram within last year Pap Smear 1-5 years ago  Medication History Kimberly [name], CMA; 07/06/2018 4:19 PM) traMADol HCl (50MG  Tablet, Oral) Active. Co Q-10 (100MG  Capsule, Oral) Active. Magnesium (200MG  Tablet, Oral) Active. Oscal 500/200 D-3 (500-200MG -UNIT Tablet, Oral) Active. valACYclovir HCl (1GM Tablet, Oral) Active. Vitamin D (1000UNIT Capsule, Oral) Active. Venlafaxine HCl (75MG  Tablet, Oral) Active. Rosuvastatin Calcium (20MG  Tablet, Oral) Active. Lisinopril (20MG  Tablet, Oral daily) Active. Venlafaxine HCl ER (75MG  Capsule ER 24HR, Oral daily) Active. Aspirin Low Strength (81MG  Tablet Chewable, Oral daily) Active. Vitamin D (Cholecalciferol) (1000UNIT Tablet, Oral daily) Active. Medications Reconciled  Social History Kimberly [name], CMA; 07/06/2018 4:16 PM) Alcohol use Occasional alcohol use.  Caffeine use Coffee. No drug use Tobacco use Current every day smoker.  Family History (Kimberly  Glo Herring, CMA; 07/06/2018 4:16 PM) Alcohol Abuse Sister. Cancer Brother. Colon Cancer Family Members In General. Diabetes Mellitus Family Members In General, Father. Heart Disease Father. Heart disease in female family member before age 26 Hypertension Father.  Pregnancy / Birth History Kimberly [name], [place]; 07/06/2018 4:16 PM) Length (months) of breastfeeding 3-6 Maternal age 27-25 Para 2  Other Problems Kimberly [name], [place]; 07/06/2018 4:16 PM) Anxiety Disorder Arthritis Chronic Obstructive Lung Disease Depression Hemorrhoids High blood pressure Hypercholesterolemia     Review of Systems (Kimberly [name] CMA; 07/06/2018 4:16 PM) Respiratory Present- Wheezing. Not Present- Bloody sputum, Chronic Cough, Difficulty Breathing and Snoring. Gastrointestinal Not Present- Abdominal Pain, Bloating, Bloody Stool, Change in Bowel Habits, Chronic diarrhea, Constipation, Difficulty Swallowing, Excessive gas, Gets full quickly at meals, Hemorrhoids, Indigestion, Nausea, Rectal Pain and Vomiting. Female Genitourinary Present- Frequency and Urgency. Not Present- Nocturia, Painful Urination and Pelvic Pain. Musculoskeletal Present- Joint Stiffness and Swelling of Extremities. Not Present- Back Pain, Joint Pain, Muscle Pain and Muscle Weakness. Neurological Not Present- Decreased Memory, Fainting, Headaches, Numbness, Seizures, Tingling, Tremor, Trouble walking and Weakness. Psychiatric Present- Anxiety. Not Present- Bipolar, Change in Sleep Pattern, Depression, Fearful and Frequent crying. Hematology Not Present- Blood Thinners, Easy Bruising, Excessive bleeding, Gland problems, HIV and Persistent Infections.  Vitals (Kimberly [name] CMA; 07/06/2018 4:16 PM) 07/06/2018 4:16 PM Weight: 128.5 lb Height: 58in Body Surface Area: 1.51 m Body Mass Index: 26.86 kg/m  Temp.: 98.72F   Pulse: 101 (Regular)  P.OX: 89% (Room air) BP: 150/86 (Sitting, Left Arm, Standard)        Physical Exam Adin Hector MD; 07/06/2018 4:40 PM)  General Mental Status-Alert. General Appearance-Not in acute distress, Not Sickly. Orientation-Oriented X3. Hydration-Well hydrated. Voice-Normal.  Integumentary Global Assessment Upon inspection and palpation of skin surfaces of the - Axillae: non-tender, no inflammation or ulceration, no drainage. and Distribution of scalp and body hair is normal. General Characteristics Temperature - normal warmth is noted.  Head and Neck Head-normocephalic, atraumatic with no lesions or palpable masses. Face Global Assessment - atraumatic, no absence of expression. Neck Global Assessment - no abnormal movements, no bruit auscultated on the right, no bruit auscultated on the left, no decreased range of motion, non-tender. Trachea-midline. Thyroid Gland Characteristics - non-tender.  Eye Eyeball - Left-Extraocular movements intact, No Nystagmus - Left. Eyeball - Right-Extraocular movements intact, No Nystagmus - Right. Cornea - Left-No Hazy - Left. Cornea - Right-No Hazy - Right. Sclera/Conjunctiva - Left-No scleral icterus, No Discharge - Left. Sclera/Conjunctiva - Right-No scleral icterus, No Discharge - Right. Pupil - Left-Direct reaction to light normal. Pupil - Right-Direct reaction to light normal.  ENMT Ears Pinna - Left - no drainage observed, no generalized tenderness observed. Pinna - Right - no drainage observed, no generalized tenderness observed. Nose and Sinuses External Inspection of the Nose - no destructive lesion observed. Inspection of the nares - Left - quiet respiration. Inspection of the nares - Right - quiet respiration. Mouth and Throat Lips - Upper Lip - no fissures observed, no pallor noted. Lower Lip - no fissures observed, no pallor noted. Nasopharynx - no discharge present.  Oral Cavity/Oropharynx - Tongue - no dryness observed. Oral Mucosa - no cyanosis observed. Hypopharynx - no evidence of airway distress observed.  Chest and Lung Exam Inspection Movements - Normal and Symmetrical. Accessory muscles - No use of accessory muscles in breathing. Palpation Palpation of the chest reveals - Non-tender. Auscultation Breath sounds - Normal and Clear. Note: On the left posterior lateral chest wall 9 x  3 cm region of swelling in ellipsoid masses adherent to the rib cage. Suspicious for lipomas  Cardiovascular Auscultation Rhythm - Regular. Murmurs & Other Heart Sounds - Auscultation of the heart reveals - No Murmurs and No Systolic Clicks.  Abdomen Inspection Inspection of the abdomen reveals - No Visible peristalsis and No Abnormal pulsations. Umbilicus - No Bleeding, No Urine drainage. Palpation/Percussion Palpation and Percussion of the abdomen reveal - Soft, Non Tender, No Rebound tenderness, No Rigidity (guarding) and No Cutaneous hyperesthesia. Note: At the left flank anterior axillary line and posterior axillary line are 1-2 centimeter subcutaneous nodules. Sensitive and firm. Not inflamed or phlegmonous.  Abdomen obese but soft. Not severely distended. No diastasis recti. No umbilical or other anterior abdominal wall hernias  Female Genitourinary Sexual Maturity Tanner 5 - Adult hair pattern. Note: No vaginal bleeding nor discharge  Peripheral Vascular Upper Extremity Inspection - Left - No Cyanotic nailbeds - Left, Not Ischemic. Inspection - Right - No Cyanotic nailbeds - Right, Not Ischemic.  Neurologic Neurologic evaluation reveals -normal attention span and ability to concentrate, able to name objects and repeat phrases. Appropriate fund of knowledge , normal sensation and normal coordination. Mental Status Affect - not angry, not paranoid. Cranial Nerves-Normal Bilaterally. Gait-Normal.  Neuropsychiatric Mental status exam  performed with findings of-able to articulate well with normal speech/language, rate, volume and coherence, thought content normal with ability to perform basic computations and apply abstract reasoning and no evidence of hallucinations, delusions, obsessions or homicidal/suicidal ideation.  Musculoskeletal Global Assessment Spine, Ribs and Pelvis - no instability, subluxation or laxity. Right Upper Extremity - no instability, subluxation or laxity. Note: On anterior proximal right thigh there is a 2.5 cm subcutaneous nodule consistent with a lipoma.  Lymphatic Head & Neck  General Head & Neck Lymphatics: Bilateral - Description - No Localized lymphadenopathy. Axillary  General Axillary Region: Bilateral - Description - No Localized lymphadenopathy. Femoral & Inguinal  Generalized Femoral & Inguinal Lymphatics: Left - Description - No Localized lymphadenopathy. Right - Description - No Localized lymphadenopathy.    Assessment & Plan Adin Hector MD; 07/06/2018 4:44 PM)  SUBCUTANEOUS MASS OF ABDOMINAL WALL (R22.2) Impression: Firm subcutaneous nodules of right flank 2. New since 2015. History of angiolipomas. I think he would benefit from excision. She agrees   MASS OF CHEST WALL, LEFT (R22.2) Impression: Soft ellipsoid masses fixed to left lateral mid chest wall in a patient with a history of lipomas angiolipomas. A 0 becoming uncomfortable and larger, reasonable removed. These rather deep near the chest wall, so I think I would require at least deep sedation to do this.  Current Plans You are being scheduled for surgery- Our schedulers will call you.  You should hear from our office's scheduling department within 5 working days about the location, date, and time of surgery. We try to make accommodations for patient's preferences in scheduling surgery, but sometimes the OR schedule or the surgeon's schedule prevents [place] from making those accommodations.  If you have not  heard from our office [phone]) in 5 working days, call the office and ask for your surgeon's nurse.  If you have other questions about your diagnosis, plan, or surgery, call the office and ask for your surgeon's nurse.  The pathophysiology of skin & subcutaneous masses was discussed. Natural history risks without surgery were discussed. I recommended surgery to remove the mass. I explained the technique of removal with use of local anesthesia & possible need for more aggressive sedation/anesthesia for patient comfort.  Risks such as bleeding, infection, wound breakdown, heart attack, death, and other risks were discussed. I noted a good likelihood this will help address the problem. Possibility that this will not correct all symptoms was explained. Possibility of regrowth/recurrence of the mass was discussed. We will work to minimize complications. Questions were answered. The patient expresses understanding & wishes to proceed with surgery.   MASS OF SOFT TISSUE OF RIGHT LOWER EXTREMITY (R22.41) Impression: Ellipsoid mass on right anterior thigh. Not physically painful or change in size. Hold off on removal unless things change. She agrees with observation only on that since it is not painful like the other lesions on her chest wall and flank   TOBACCO ABUSE (Z72.0)  Current Plans Pt Education - [facility] STOP SMOKING!  Adin Hector, MD, FACS, MASCRS Gastrointestinal and Minimally Invasive Surgery    [address] [place], [place] [address] [phone] Main / Paging [phone] Fax

## 2018-07-18 ENCOUNTER — Other Ambulatory Visit (HOSPITAL_COMMUNITY)
Admission: RE | Admit: 2018-07-18 | Discharge: 2018-07-18 | Disposition: A | Discharge status: Home / Self Care | Payer: PPO | Source: Ambulatory Visit | Attending: Surgery | Admitting: Surgery

## 2018-07-18 ENCOUNTER — Other Ambulatory Visit (HOSPITAL_COMMUNITY): Payer: Self-pay | Admitting: *Deleted

## 2018-07-18 DIAGNOSIS — Z01812 Encounter for preprocedural laboratory examination: Secondary | ICD-10-CM | POA: Diagnosis not present

## 2018-07-18 DIAGNOSIS — Z1159 Encounter for screening for other viral diseases: Secondary | ICD-10-CM | POA: Diagnosis not present

## 2018-07-18 LAB — SARS CORONAVIRUS 2 (TAT 6-24 HRS): SARS Coronavirus 2: NEGATIVE

## 2018-07-18 NOTE — Patient Instructions (Signed)
YOU NEED TO HAVE A COVID 19 TEST ON_______ @_______ , THIS TEST MUST BE DONE BEFORE SURGERY, COME TO [address] ENTRANCE. ONCE YOUR COVID TEST IS COMPLETED, PLEASE BEGIN THE QUARANTINE INSTRUCTIONS AS OUTLINED IN YOUR HANDOUT.                Kimberly [name]    Your procedure is scheduled on: 07-21-2018  Report to [facility] Main  Entrance  Report to admitting at 1100 AM      Call this number if you have problems the morning of surgery [phone]    Remember: Do not eat food  :After Midnight. CLEAR LIQUIDS FROM MIDNIGHT UNTIL 700 AM. NOTHING BY MOUTH AFTER 700 AM. BRUSH YOUR TEETH MORNING OF SURGERY AND RINSE YOUR MOUTH OUT, NO CHEWING GUM CANDY OR MINTS.     CLEAR LIQUID DIET   Foods Allowed                                                                     Foods Excluded  Coffee and tea, regular and decaf                             liquids that you cannot  Plain Jell-O in any flavor                                             see through such as: Fruit ices (not with fruit pulp)                                     milk, soups, orange juice  Iced Popsicles                                    All solid food Carbonated beverages, regular and diet                                    Cranberry, grape and apple juices Sports drinks like Gatorade Lightly seasoned clear broth or consume(fat free) Sugar, honey syrup  Sample Menu Breakfast                                Lunch                                     Supper Cranberry juice                    Beef broth                            Chicken broth Jell-O  Grape juice                           Apple juice Coffee or tea                        Jell-O                                      Popsicle                                                Coffee or tea                        Coffee or  tea  _____________________________________________________________________     Take these medicines the morning of surgery with A SIP OF WATER:                                 You may not have any metal on your body including hair pins and              piercings  Do not wear jewelry, make-up, lotions, powders or perfumes, deodorant             Do not wear nail polish.  Do not shave  48 hours prior to surgery.               Do not bring valuables to the hospital. [place].  Contacts, dentures or bridgework may not be worn into surgery.  Leave suitcase in the car. After surgery it may be brought to your room.     Patients discharged the day of surgery will not be allowed to drive home. IF YOU ARE HAVING SURGERY AND GOING HOME THE SAME DAY, YOU MUST HAVE AN ADULT TO DRIVE YOU HOME AND BE WITH YOU FOR 24 HOURS. YOU MAY GO HOME BY TAXI OR UBER OR ORTHERWISE, BUT AN ADULT MUST ACCOMPANY YOU HOME AND STAY WITH YOU FOR 24 HOURS.  Name and phone number of your driver:  Special Instructions: N/A              Please read over the following fact sheets you were given: _____________________________________________________________________             [facility] - Preparing for Surgery Before surgery, you can play an important role.  Because skin is not sterile, your skin needs to be as free of germs as possible.  You can reduce the number of germs on your skin by washing with CHG (chlorahexidine gluconate) soap before surgery.  CHG is an antiseptic cleaner which kills germs and bonds with the skin to continue killing germs even after washing. Please DO NOT use if you have an allergy to CHG or antibacterial soaps.  If your skin becomes reddened/irritated stop using the CHG and inform your nurse when you arrive at Short Stay. Do not shave (including legs and underarms) for at least 48 hours prior to the first CHG shower.  You may shave your  face/neck. Please follow these instructions carefully:  1.  Shower with CHG Soap the night before surgery and the  morning of Surgery.  2.  If you choose to wash your hair, wash your hair first as usual with your  normal  shampoo.  3.  After you shampoo, rinse your hair and body thoroughly to remove the  shampoo.                           4.  Use CHG as you would any other liquid soap.  You can apply chg directly  to the skin and wash                       Gently with a scrungie or clean washcloth.  5.  Apply the CHG Soap to your body ONLY FROM THE NECK DOWN.   Do not use on face/ open                           Wound or open sores. Avoid contact with eyes, ears mouth and genitals (private parts).                       Wash face,  Genitals (private parts) with your normal soap.             6.  Wash thoroughly, paying special attention to the area where your surgery  will be performed.  7.  Thoroughly rinse your body with warm water from the neck down.  8.  DO NOT shower/wash with your normal soap after using and rinsing off  the CHG Soap.                9.  Pat yourself dry with a clean towel.            10.  Wear clean pajamas.            11.  Place clean sheets on your bed the night of your first shower and do not  sleep with pets. Day of Surgery : Do not apply any lotions/deodorants the morning of surgery.  Please wear clean clothes to the hospital/surgery center.  FAILURE TO FOLLOW THESE INSTRUCTIONS MAY RESULT IN THE CANCELLATION OF YOUR SURGERY PATIENT SIGNATURE_________________________________  NURSE SIGNATURE__________________________________  ________________________________________________________________________

## 2018-07-19 ENCOUNTER — Inpatient Hospital Stay (HOSPITAL_COMMUNITY)
Admission: RE | Admit: 2018-07-19 | Discharge: 2018-07-19 | Disposition: A | Discharge status: Home / Self Care | Payer: PPO | Source: Ambulatory Visit

## 2018-07-21 ENCOUNTER — Ambulatory Visit (HOSPITAL_COMMUNITY): Admission: RE | Admit: 2018-07-21 | Payer: PPO | Source: Ambulatory Visit | Admitting: Surgery

## 2018-07-21 ENCOUNTER — Encounter (HOSPITAL_COMMUNITY): Admission: RE | Payer: Self-pay | Source: Ambulatory Visit

## 2018-07-21 SURGERY — EXCISION MASS
Anesthesia: General

## 2018-07-22 ENCOUNTER — Other Ambulatory Visit (HOSPITAL_COMMUNITY)
Admission: RE | Admit: 2018-07-22 | Discharge: 2018-07-22 | Disposition: A | Discharge status: Home / Self Care | Payer: PPO | Source: Ambulatory Visit | Attending: Surgery | Admitting: Surgery

## 2018-07-22 DIAGNOSIS — Z01812 Encounter for preprocedural laboratory examination: Secondary | ICD-10-CM | POA: Insufficient documentation

## 2018-07-22 DIAGNOSIS — Z1159 Encounter for screening for other viral diseases: Secondary | ICD-10-CM | POA: Insufficient documentation

## 2018-07-22 LAB — SARS CORONAVIRUS 2 (TAT 6-24 HRS): SARS Coronavirus 2: NEGATIVE

## 2018-07-25 ENCOUNTER — Encounter (HOSPITAL_BASED_OUTPATIENT_CLINIC_OR_DEPARTMENT_OTHER): Payer: Self-pay

## 2018-07-25 ENCOUNTER — Other Ambulatory Visit: Payer: Self-pay

## 2018-07-25 NOTE — Progress Notes (Signed)
SPOKE W/  Marcie Bal     SCREENING SYMPTOMS OF COVID 19:   COUGH--YES  RUNNY NOSE--- YES  SORE THROAT---NO  NASAL CONGESTION----NO  SNEEZING----YES  SHORTNESS OF BREATH---NO  DIFFICULTY BREATHING---NO  TEMP >100.0 -----NO  UNEXPLAINED BODY ACHES------NO  CHILLS -------- NO  HEADACHES ---------NO  LOSS OF SMELL/ TASTE --------NO    HAVE YOU OR ANY FAMILY MEMBER TRAVELLED PAST 14 DAYS OUT OF THE   COUNTY---NO STATE----NO COUNTRY----NO  HAVE YOU OR ANY FAMILY MEMBER BEEN EXPOSED TO ANYONE WITH COVID 19? NO

## 2018-07-25 NOTE — Progress Notes (Signed)
Spoke with:  Marcie Bal NPO:  No food after midnight/Clear liquids until 7:00AM DOS Arrival time: 1100AM Labs: Istat 8, EKG AM medications: Venlafaxine, Bring Inhaler Pre op orders: Yes Ride home: Colin Ina (husband) [phone]

## 2018-07-26 ENCOUNTER — Ambulatory Visit (HOSPITAL_BASED_OUTPATIENT_CLINIC_OR_DEPARTMENT_OTHER): Payer: PPO | Admitting: Certified Registered"

## 2018-07-26 ENCOUNTER — Other Ambulatory Visit: Payer: Self-pay

## 2018-07-26 ENCOUNTER — Ambulatory Visit (HOSPITAL_BASED_OUTPATIENT_CLINIC_OR_DEPARTMENT_OTHER)
Admission: RE | Admit: 2018-07-26 | Discharge: 2018-07-26 | Disposition: A | Discharge status: Home / Self Care | Payer: PPO | Source: Ambulatory Visit | Attending: Surgery | Admitting: Surgery

## 2018-07-26 ENCOUNTER — Encounter (HOSPITAL_BASED_OUTPATIENT_CLINIC_OR_DEPARTMENT_OTHER)
Admission: RE | Disposition: A | Discharge status: Home / Self Care | Payer: Self-pay | Source: Ambulatory Visit | Attending: Surgery

## 2018-07-26 ENCOUNTER — Encounter (HOSPITAL_BASED_OUTPATIENT_CLINIC_OR_DEPARTMENT_OTHER): Payer: Self-pay | Admitting: Certified Registered"

## 2018-07-26 DIAGNOSIS — R222 Localized swelling, mass and lump, trunk: Secondary | ICD-10-CM | POA: Diagnosis not present

## 2018-07-26 DIAGNOSIS — K449 Diaphragmatic hernia without obstruction or gangrene: Secondary | ICD-10-CM | POA: Insufficient documentation

## 2018-07-26 DIAGNOSIS — E7800 Pure hypercholesterolemia, unspecified: Secondary | ICD-10-CM | POA: Insufficient documentation

## 2018-07-26 DIAGNOSIS — D1723 Benign lipomatous neoplasm of skin and subcutaneous tissue of right leg: Secondary | ICD-10-CM | POA: Diagnosis not present

## 2018-07-26 DIAGNOSIS — F419 Anxiety disorder, unspecified: Secondary | ICD-10-CM | POA: Diagnosis not present

## 2018-07-26 DIAGNOSIS — Z79899 Other long term (current) drug therapy: Secondary | ICD-10-CM | POA: Diagnosis not present

## 2018-07-26 DIAGNOSIS — Z811 Family history of alcohol abuse and dependence: Secondary | ICD-10-CM | POA: Diagnosis not present

## 2018-07-26 DIAGNOSIS — I10 Essential (primary) hypertension: Secondary | ICD-10-CM | POA: Diagnosis not present

## 2018-07-26 DIAGNOSIS — F17210 Nicotine dependence, cigarettes, uncomplicated: Secondary | ICD-10-CM | POA: Diagnosis not present

## 2018-07-26 DIAGNOSIS — Z90710 Acquired absence of both cervix and uterus: Secondary | ICD-10-CM | POA: Diagnosis not present

## 2018-07-26 DIAGNOSIS — J449 Chronic obstructive pulmonary disease, unspecified: Secondary | ICD-10-CM | POA: Insufficient documentation

## 2018-07-26 DIAGNOSIS — D171 Benign lipomatous neoplasm of skin and subcutaneous tissue of trunk: Secondary | ICD-10-CM | POA: Insufficient documentation

## 2018-07-26 DIAGNOSIS — M1990 Unspecified osteoarthritis, unspecified site: Secondary | ICD-10-CM | POA: Insufficient documentation

## 2018-07-26 DIAGNOSIS — Z809 Family history of malignant neoplasm, unspecified: Secondary | ICD-10-CM | POA: Diagnosis not present

## 2018-07-26 DIAGNOSIS — Z8249 Family history of ischemic heart disease and other diseases of the circulatory system: Secondary | ICD-10-CM | POA: Diagnosis not present

## 2018-07-26 DIAGNOSIS — Z833 Family history of diabetes mellitus: Secondary | ICD-10-CM | POA: Diagnosis not present

## 2018-07-26 DIAGNOSIS — Z882 Allergy status to sulfonamides status: Secondary | ICD-10-CM | POA: Diagnosis not present

## 2018-07-26 DIAGNOSIS — Z800 Family history of malignant neoplasm of digestive organs: Secondary | ICD-10-CM | POA: Diagnosis not present

## 2018-07-26 DIAGNOSIS — R2241 Localized swelling, mass and lump, right lower limb: Secondary | ICD-10-CM | POA: Diagnosis not present

## 2018-07-26 DIAGNOSIS — D179 Benign lipomatous neoplasm, unspecified: Secondary | ICD-10-CM

## 2018-07-26 DIAGNOSIS — R51 Headache: Secondary | ICD-10-CM | POA: Insufficient documentation

## 2018-07-26 DIAGNOSIS — F329 Major depressive disorder, single episode, unspecified: Secondary | ICD-10-CM | POA: Insufficient documentation

## 2018-07-26 DIAGNOSIS — Z7982 Long term (current) use of aspirin: Secondary | ICD-10-CM | POA: Diagnosis not present

## 2018-07-26 DIAGNOSIS — Z86010 Personal history of colonic polyps: Secondary | ICD-10-CM | POA: Insufficient documentation

## 2018-07-26 DIAGNOSIS — K5900 Constipation, unspecified: Secondary | ICD-10-CM | POA: Diagnosis not present

## 2018-07-26 HISTORY — DX: Diverticulosis of large intestine without perforation or abscess without bleeding: K57.30

## 2018-07-26 HISTORY — DX: Localized swelling, mass and lump, unspecified: R22.9

## 2018-07-26 HISTORY — DX: Chronic obstructive pulmonary disease, unspecified: J44.9

## 2018-07-26 HISTORY — PX: MASS EXCISION: SHX2000

## 2018-07-26 HISTORY — DX: Presence of dental prosthetic device (complete) (partial): Z97.2

## 2018-07-26 HISTORY — DX: Personal history of other endocrine, nutritional and metabolic disease: Z86.39

## 2018-07-26 HISTORY — DX: Cardiac murmur, unspecified: R01.1

## 2018-07-26 LAB — POCT I-STAT, CHEM 8
BUN: 20 mg/dL (ref 8–23)
Calcium, Ion: 1.29 mmol/L (ref 1.15–1.40)
Chloride: 104 mmol/L (ref 98–111)
Creatinine, Ser: 1.2 mg/dL — ABNORMAL HIGH (ref 0.44–1.00)
Glucose, Bld: 106 mg/dL — ABNORMAL HIGH (ref 70–99)
HCT: 44 % (ref 36.0–46.0)
Hemoglobin: 15 g/dL (ref 12.0–15.0)
Potassium: 4.2 mmol/L (ref 3.5–5.1)
Sodium: 140 mmol/L (ref 135–145)
TCO2: 29 mmol/L (ref 22–32)

## 2018-07-26 SURGERY — EXCISION MASS
Anesthesia: General | Site: Abdomen

## 2018-07-26 MED ORDER — CLINDAMYCIN PHOSPHATE 900 MG/50ML IV SOLN
INTRAVENOUS | Status: AC
  Filled 2018-07-26: qty 50

## 2018-07-26 MED ORDER — CHLORHEXIDINE GLUCONATE CLOTH 2 % EX PADS
6.0000 | MEDICATED_PAD | Freq: Once | CUTANEOUS | Status: DC
  Filled 2018-07-26: qty 6

## 2018-07-26 MED ORDER — PROPOFOL 500 MG/50ML IV EMUL
INTRAVENOUS | Status: AC
  Filled 2018-07-26: qty 50

## 2018-07-26 MED ORDER — PHENYLEPHRINE 40 MCG/ML (10ML) SYRINGE FOR IV PUSH (FOR BLOOD PRESSURE SUPPORT)
PREFILLED_SYRINGE | INTRAVENOUS | Status: AC
  Filled 2018-07-26: qty 10

## 2018-07-26 MED ORDER — ONDANSETRON HCL 4 MG/2ML IJ SOLN
INTRAMUSCULAR | Status: DC | PRN
  Administered 2018-07-26: 4 mg via INTRAVENOUS

## 2018-07-26 MED ORDER — TRAMADOL HCL 50 MG PO TABS: 50.0000 mg | ORAL_TABLET | Freq: Four times a day (QID) | ORAL | 0 refills | Status: DC | PRN

## 2018-07-26 MED ORDER — GABAPENTIN 300 MG PO CAPS
ORAL_CAPSULE | ORAL | Status: AC
  Filled 2018-07-26: qty 1

## 2018-07-26 MED ORDER — MIDAZOLAM HCL 2 MG/2ML IJ SOLN
INTRAMUSCULAR | Status: AC
  Filled 2018-07-26: qty 2

## 2018-07-26 MED ORDER — BUPIVACAINE LIPOSOME 1.3 % IJ SUSP
INTRAMUSCULAR | Status: AC
  Filled 2018-07-26: qty 20

## 2018-07-26 MED ORDER — CLINDAMYCIN PHOSPHATE 900 MG/50ML IV SOLN
900.0000 mg | INTRAVENOUS | Status: AC
  Administered 2018-07-26: 900 mg via INTRAVENOUS
  Filled 2018-07-26: qty 50

## 2018-07-26 MED ORDER — PROPOFOL 10 MG/ML IV BOLUS
INTRAVENOUS | Status: DC | PRN
  Administered 2018-07-26: 30 mg via INTRAVENOUS
  Administered 2018-07-26: 170 mg via INTRAVENOUS

## 2018-07-26 MED ORDER — ONDANSETRON HCL 4 MG/2ML IJ SOLN
4.0000 mg | Freq: Once | INTRAMUSCULAR | Status: DC | PRN
  Filled 2018-07-26: qty 2

## 2018-07-26 MED ORDER — BUPIVACAINE LIPOSOME 1.3 % IJ SUSP
INTRAMUSCULAR | Status: DC | PRN
  Administered 2018-07-26: 20 mL

## 2018-07-26 MED ORDER — LACTATED RINGERS IV SOLN
INTRAVENOUS | Status: DC
  Administered 2018-07-26 (×2): via INTRAVENOUS
  Filled 2018-07-26: qty 1000

## 2018-07-26 MED ORDER — DEXAMETHASONE SODIUM PHOSPHATE 10 MG/ML IJ SOLN
INTRAMUSCULAR | Status: DC | PRN
  Administered 2018-07-26: 5 mg via INTRAVENOUS

## 2018-07-26 MED ORDER — GABAPENTIN 300 MG PO CAPS
300.0000 mg | ORAL_CAPSULE | ORAL | Status: AC
  Administered 2018-07-26: 300 mg via ORAL
  Filled 2018-07-26: qty 1

## 2018-07-26 MED ORDER — BUPIVACAINE-EPINEPHRINE 0.25% -1:200000 IJ SOLN
INTRAMUSCULAR | Status: DC | PRN
  Administered 2018-07-26: 30 mL

## 2018-07-26 MED ORDER — MIDAZOLAM HCL 2 MG/2ML IJ SOLN
INTRAMUSCULAR | Status: DC | PRN
  Administered 2018-07-26: 1 mg via INTRAVENOUS

## 2018-07-26 MED ORDER — ROCURONIUM BROMIDE 10 MG/ML (PF) SYRINGE
PREFILLED_SYRINGE | INTRAVENOUS | Status: DC | PRN
  Administered 2018-07-26: 50 mg via INTRAVENOUS

## 2018-07-26 MED ORDER — ATROPINE SULFATE 0.4 MG/ML IJ SOLN
INTRAMUSCULAR | Status: AC
  Filled 2018-07-26: qty 1

## 2018-07-26 MED ORDER — ONDANSETRON HCL 4 MG/2ML IJ SOLN
INTRAMUSCULAR | Status: AC
  Filled 2018-07-26: qty 2

## 2018-07-26 MED ORDER — FENTANYL CITRATE (PF) 100 MCG/2ML IJ SOLN
INTRAMUSCULAR | Status: DC | PRN
  Administered 2018-07-26 (×3): 50 ug via INTRAVENOUS

## 2018-07-26 MED ORDER — FENTANYL CITRATE (PF) 100 MCG/2ML IJ SOLN
INTRAMUSCULAR | Status: AC
  Filled 2018-07-26: qty 2

## 2018-07-26 MED ORDER — MEPERIDINE HCL 25 MG/ML IJ SOLN
6.2500 mg | INTRAMUSCULAR | Status: DC | PRN
  Filled 2018-07-26: qty 1

## 2018-07-26 MED ORDER — GENTAMICIN SULFATE 40 MG/ML IJ SOLN
5.0000 mg/kg | INTRAVENOUS | Status: AC
  Administered 2018-07-26: 280 mg via INTRAVENOUS
  Filled 2018-07-26: qty 7

## 2018-07-26 MED ORDER — SUGAMMADEX SODIUM 200 MG/2ML IV SOLN
INTRAVENOUS | Status: DC | PRN
  Administered 2018-07-26: 150 mg via INTRAVENOUS

## 2018-07-26 MED ORDER — HYDROMORPHONE HCL 1 MG/ML IJ SOLN
0.2500 mg | INTRAMUSCULAR | Status: DC | PRN
  Filled 2018-07-26: qty 0.5

## 2018-07-26 MED ORDER — ACETAMINOPHEN 500 MG PO TABS
1000.0000 mg | ORAL_TABLET | ORAL | Status: AC
  Administered 2018-07-26: 1000 mg via ORAL
  Filled 2018-07-26: qty 2

## 2018-07-26 MED ORDER — IPRATROPIUM-ALBUTEROL 0.5-2.5 (3) MG/3ML IN SOLN
3.0000 mL | Freq: Once | RESPIRATORY_TRACT | Status: AC
  Administered 2018-07-26: 3 mL via RESPIRATORY_TRACT
  Filled 2018-07-26 (×2): qty 3

## 2018-07-26 MED ORDER — PHENYLEPHRINE 40 MCG/ML (10ML) SYRINGE FOR IV PUSH (FOR BLOOD PRESSURE SUPPORT)
PREFILLED_SYRINGE | INTRAVENOUS | Status: DC | PRN
  Administered 2018-07-26: 120 ug via INTRAVENOUS

## 2018-07-26 MED ORDER — PROPOFOL 10 MG/ML IV BOLUS
INTRAVENOUS | Status: AC
  Filled 2018-07-26: qty 20

## 2018-07-26 MED ORDER — LIDOCAINE 2% (20 MG/ML) 5 ML SYRINGE
INTRAMUSCULAR | Status: DC | PRN
  Administered 2018-07-26: 100 mg via INTRAVENOUS

## 2018-07-26 MED ORDER — ACETAMINOPHEN 500 MG PO TABS
ORAL_TABLET | ORAL | Status: AC
  Filled 2018-07-26: qty 2

## 2018-07-26 SURGICAL SUPPLY — 62 items
APL SKNCLS STERI-STRIP NONHPOA (GAUZE/BANDAGES/DRESSINGS)
BENZOIN TINCTURE PRP APPL 2/3 (GAUZE/BANDAGES/DRESSINGS) IMPLANT
BINDER ABDOMINAL 12 ML 46-62 (SOFTGOODS) ×2 IMPLANT
BLADE CLIPPER SENSICLIP SURGIC (BLADE) IMPLANT
BLADE HEX COATED 2.75 (ELECTRODE) ×3 IMPLANT
BLADE SURG 10 STRL SS (BLADE) IMPLANT
BLADE SURG 15 STRL LF DISP TIS (BLADE) ×1 IMPLANT
BLADE SURG 15 STRL SS (BLADE) ×6
BNDG GAUZE ELAST 4 BULKY (GAUZE/BANDAGES/DRESSINGS) IMPLANT
BRIEF STRETCH FOR OB PAD LRG (UNDERPADS AND DIAPERS) IMPLANT
CANISTER SUCTION 1200CC (MISCELLANEOUS) ×2 IMPLANT
CHLORAPREP W/TINT 26ML (MISCELLANEOUS) ×4 IMPLANT
CLOSURE WOUND 1/2 X4 (GAUZE/BANDAGES/DRESSINGS) ×3
COVER BACK TABLE 60X90IN (DRAPES) ×3 IMPLANT
COVER MAYO STAND STRL (DRAPES) ×3 IMPLANT
COVER WAND RF STERILE (DRAPES) ×4 IMPLANT
DRAIN CHANNEL 15F RND FF 3/16 (WOUND CARE) ×2 IMPLANT
DRAPE LAPAROTOMY 100X72 PEDS (DRAPES) IMPLANT
DRAPE ORTHO SPLIT 77X108 STRL (DRAPES) ×3
DRAPE SURG ORHT 6 SPLT 77X108 (DRAPES) IMPLANT
DRAPE UTILITY XL STRL (DRAPES) ×4 IMPLANT
DRSG PAD ABDOMINAL 8X10 ST (GAUZE/BANDAGES/DRESSINGS) IMPLANT
DRSG TEGADERM 4X4.75 (GAUZE/BANDAGES/DRESSINGS) ×4 IMPLANT
ELECT REM PT RETURN 9FT ADLT (ELECTROSURGICAL) ×3
ELECTRODE REM PT RTRN 9FT ADLT (ELECTROSURGICAL) ×1 IMPLANT
GAUZE SPONGE 4X4 12PLY STRL (GAUZE/BANDAGES/DRESSINGS) IMPLANT
GAUZE SPONGE 4X4 16PLY XRAY LF (GAUZE/BANDAGES/DRESSINGS) ×2 IMPLANT
GLOVE BIO SURGEON STRL SZ 6.5 (GLOVE) ×1 IMPLANT
GLOVE BIO SURGEONS STRL SZ 6.5 (GLOVE) ×1
GLOVE BIOGEL PI IND STRL 6.5 (GLOVE) IMPLANT
GLOVE BIOGEL PI INDICATOR 6.5 (GLOVE) ×2
GLOVE ECLIPSE 8.0 STRL XLNG CF (GLOVE) ×3 IMPLANT
GLOVE INDICATOR 8.0 STRL GRN (GLOVE) ×3 IMPLANT
GOWN STRL REUS W/TWL LRG LVL3 (GOWN DISPOSABLE) ×3 IMPLANT
KIT TURNOVER CYSTO (KITS) ×3 IMPLANT
LIGACLIP MEDIUM LS 200 (MISCELLANEOUS) IMPLANT
NEEDLE HYPO 22GX1.5 SAFETY (NEEDLE) ×3 IMPLANT
NS IRRIG 500ML POUR BTL (IV SOLUTION) ×3 IMPLANT
PACK BASIN DAY SURGERY FS (CUSTOM PROCEDURE TRAY) ×3 IMPLANT
PENCIL BUTTON HOLSTER BLD 10FT (ELECTRODE) ×3 IMPLANT
SPONGE GAUZE 2X2 8PLY STER LF (GAUZE/BANDAGES/DRESSINGS) ×2
SPONGE GAUZE 2X2 8PLY STRL LF (GAUZE/BANDAGES/DRESSINGS) ×2 IMPLANT
SPONGE LAP 4X18 RFD (DISPOSABLE) IMPLANT
STOCKINETTE 6  STRL (DRAPES)
STOCKINETTE 6 STRL (DRAPES) IMPLANT
STRIP CLOSURE SKIN 1/2X4 (GAUZE/BANDAGES/DRESSINGS) ×3 IMPLANT
SUT MNCRL AB 4-0 PS2 18 (SUTURE) ×4 IMPLANT
SUT PROLENE 2 0 CT2 30 (SUTURE) IMPLANT
SUT VIC AB 3-0 SH 18 (SUTURE) IMPLANT
SUT VIC AB 3-0 SH 27 (SUTURE)
SUT VIC AB 3-0 SH 27X BRD (SUTURE) IMPLANT
SUT VICRYL 2 0 18  UND BR (SUTURE)
SUT VICRYL 2 0 18 UND BR (SUTURE) IMPLANT
SYR 20CC LL (SYRINGE) ×3 IMPLANT
SYR BULB IRRIGATION 50ML (SYRINGE) ×3 IMPLANT
SYR CONTROL 10ML LL (SYRINGE) IMPLANT
TOWEL OR 17X26 10 PK STRL BLUE (TOWEL DISPOSABLE) ×4 IMPLANT
TRAY DSU PREP LF (CUSTOM PROCEDURE TRAY) ×1 IMPLANT
TUBE CONNECTING 12'X1/4 (SUCTIONS) ×1
TUBE CONNECTING 12X1/4 (SUCTIONS) ×2 IMPLANT
WATER STERILE IRR 500ML POUR (IV SOLUTION) ×1 IMPLANT
YANKAUER SUCT BULB TIP NO VENT (SUCTIONS) ×3 IMPLANT

## 2018-07-26 NOTE — Discharge Instructions (Signed)
GENERAL SURGERY: POST OP INSTRUCTIONS  ######################################################################  EAT Gradually transition to a high fiber diet with a fiber supplement over the next few weeks after discharge.  Start with a pureed / full liquid diet (see below)  Wear your white Velcro abdominal/chest wall binder as much as possible the first 5 days to minimize bruising. Okay to remove to shower every day and then replace the binder.Marland Kitchen  WALK Walk an hour a day.  Control your pain to do that.    CONTROL PAIN Control pain so that you can walk, sleep, tolerate sneezing/coughing, go up/down stairs.  HAVE A BOWEL MOVEMENT DAILY Keep your bowels regular to avoid problems.  OK to try a laxative to override constipation.  OK to use an antidairrheal to slow down diarrhea.  Call if not better after 2 tries  CALL IF YOU HAVE PROBLEMS/CONCERNS Call if you are still struggling despite following these instructions. Call if you have concerns not answered by these instructions  ######################################################################    1. DIET: Follow a light bland diet & liquids the first 24 hours after arrival home, such as soup, liquids, starches, etc.  Be sure to drink plenty of fluids.  Quickly advance to a usual solid diet within a few days.  Avoid fast food or heavy meals as your are more likely to get nauseated or have irregular bowels.  A low-fat, high-fiber diet for the rest of your life is ideal.   2. Take your usually prescribed home medications unless otherwise directed. 3. PAIN CONTROL: a. Pain is best controlled by a usual combination of three different methods TOGETHER: i. Ice/Heat ii. Over the counter pain medication iii. Prescription pain medication b. Most patients will experience some swelling and bruising around the incisions.  Ice packs or heating pads (30-60 minutes up to 6 times a day) will help. Use ice for the first few days to help decrease  swelling and bruising, then switch to heat to help relax tight/sore spots and speed recovery.  Some people prefer to use ice alone, heat alone, alternating between ice & heat.  Experiment to what works for you.  Swelling and bruising can take several weeks to resolve.   c. It is helpful to take an over-the-counter pain medication regularly for the first few weeks.  Choose one of the following that works best for you: i. Naproxen (Aleve, etc)  Two 220mg  tabs twice a day ii. Ibuprofen (Advil, etc) Three 200mg  tabs four times a day (every meal & bedtime) iii. Acetaminophen (Tylenol, etc) 500-650mg  four times a day (every meal & bedtime) d. A  prescription for pain medication (such as oxycodone, hydrocodone, etc) should be given to you upon discharge.  Take your pain medication as prescribed.  i. If you are having problems/concerns with the prescription medicine (does not control pain, nausea, vomiting, rash, itching, etc), please call us [phone] to see if we need to switch you to a different pain medicine that will work better for you and/or control your side effect better. ii. If you need a refill on your pain medication, please contact your pharmacy.  They will contact our office to request authorization. Prescriptions will not be filled after 5 pm or on week-ends. 4. Avoid getting constipated.  Between the surgery and the pain medications, it is common to experience some constipation.  Increasing fluid intake and taking a fiber supplement (such as Metamucil, Citrucel, FiberCon, MiraLax, etc) 1-2 times a day regularly will usually help prevent this problem from occurring.  A mild laxative (prune juice, Milk of Magnesia, MiraLax, etc) should be taken according to package directions if there are no bowel movements after 48 hours.    5. Wash / shower every day.  You may shower over the dressings as they are waterproof.  Continue to shower over incision(s) after the dressing is off.  6. Remove your  waterproof bandages 5 days after surgery.  You may leave the incision open to air.  You may have skin tapes (Steri Strips) covering the incision(s).  Leave them on until one week, then remove.  You may replace a dressing/Band-Aid to cover the incision for comfort if you wish.      7. ACTIVITIES as tolerated:   a. You may resume regular (light) daily activities beginning the next day--such as daily self-care, walking, climbing stairs--gradually increasing activities as tolerated.  If you can walk 30 minutes without difficulty, it is safe to try more intense activity such as jogging, treadmill, bicycling, low-impact aerobics, swimming, etc. b. Save the most intensive and strenuous activity for last such as sit-ups, heavy lifting, contact sports, etc  Refrain from any heavy lifting or straining until you are off narcotics for pain control.   c. DO NOT PUSH THROUGH PAIN.  Let pain be your guide: If it hurts to do something, don't do it.  Pain is your body warning you to avoid that activity for another week until the pain goes down. d. You may drive when you are no longer taking prescription pain medication, you can comfortably wear a seatbelt, and you can safely maneuver your car and apply brakes. e. Dennis Bast may have sexual intercourse when it is comfortable.  8. FOLLOW UP in our office a. Please call [facility] at [phone] to set up an appointment to see your surgeon in the office for a follow-up appointment approximately 2-3 weeks after your surgery. b. Make sure that you call for this appointment the day you arrive home to insure a convenient appointment time. 9. IF YOU HAVE DISABILITY OR FAMILY LEAVE FORMS, BRING THEM TO THE OFFICE FOR PROCESSING.  DO NOT GIVE THEM TO YOUR DOCTOR.   WHEN TO CALL us [phone]: 1. Poor pain control 2. Reactions / problems with new medications (rash/itching, nausea, etc)  3. Fever over 101.5 F (38.5 C) 4. Worsening swelling or bruising 5. Continued bleeding from  incision. 6. Increased pain, redness, or drainage from the incision 7. Difficulty breathing / swallowing   The clinic staff is available to answer your questions during regular business hours (8:30am-5pm).  Please dont hesitate to call and ask to speak to one of our nurses for clinical concerns.   If you have a medical emergency, go to the nearest emergency room or call 911.  A surgeon from [facility] is always on call at the [facility], [place], [place], [place], [place]  [id number] ? MAIN: [phone] ? TOLL FREE: [phone] ?  FAX [phone] [email or web address]  Information for Discharge Teaching: EXPAREL (bupivacaine liposome injectable suspension)   Your surgeon or anesthesiologist gave you EXPAREL(bupivacaine) to help control your pain after surgery.   EXPAREL is a local anesthetic that provides pain relief by numbing the tissue around the surgical site.  EXPAREL is designed to release pain medication over time and can control pain for up to 72 hours.  Depending on how you respond to EXPAREL, you may require less pain medication during your recovery.  Possible side  effects:  Temporary loss of sensation or ability to move in the area where bupivacaine was injected.  Nausea, vomiting, constipation  Rarely, numbness and tingling in your mouth or lips, lightheadedness, or anxiety may occur.  Call your doctor right away if you think you may be experiencing any of these sensations, or if you have other questions regarding possible side effects.  Follow all other discharge instructions given to you by your surgeon or nurse. Eat a healthy diet and drink plenty of water or other fluids.  If you return to the hospital for any reason within 96 hours following the administration of EXPAREL, it is important for health care providers to know that you have received this anesthetic. A teal colored band has been  placed on your arm with the date, time and amount of EXPAREL you have received in order to alert and inform your health care providers. Please leave this armband in place for the full 96 hours following administration, and then you may remove the band.  Post Anesthesia Home Care Instructions  Activity: Get plenty of rest for the remainder of the day. A responsible individual must stay with you for 24 hours following the procedure.  For the next 24 hours, DO NOT: -Drive a car -Paediatric nurse -Drink alcoholic beverages -Take any medication unless instructed by your physician -Make any legal decisions or sign important papers.  Meals: Start with liquid foods such as gelatin or soup. Progress to regular foods as tolerated. Avoid greasy, spicy, heavy foods. If nausea and/or vomiting occur, drink only clear liquids until the nausea and/or vomiting subsides. Call your physician if vomiting continues.  Special Instructions/Symptoms: Your throat may feel dry or sore from the anesthesia or the breathing tube placed in your throat during surgery. If this causes discomfort, gargle with warm salt water. The discomfort should disappear within 24 hours.  If you had a scopolamine patch placed behind your ear for the management of post- operative nausea and/or vomiting:  1. The medication in the patch is effective for 72 hours, after which it should be removed.  Wrap patch in a tissue and discard in the trash. Wash hands thoroughly with soap and water. 2. You may remove the patch earlier than 72 hours if you experience unpleasant side effects which may include dry mouth, dizziness or visual disturbances. 3. Avoid touching the patch. Wash your hands with soap and water after contact with the patch.

## 2018-07-26 NOTE — Anesthesia Preprocedure Evaluation (Signed)
Anesthesia Evaluation  Patient identified by MRN, date of birth, ID band Patient awake    Reviewed: Allergy & Precautions, H&P , NPO status , Patient's Chart, lab work & pertinent test results  History of Anesthesia Complications Negative for: history of anesthetic complications  Airway Mallampati: II  TM Distance: >3 FB Neck ROM: Full    Dental  (+) Dental Advisory Given   Pulmonary COPD, Current Smoker,    Pulmonary exam normal breath sounds clear to auscultation       Cardiovascular hypertension, Pt. on medications Normal cardiovascular exam+ Valvular Problems/Murmurs  Rhythm:Regular Rate:Normal     Neuro/Psych  Headaches, PSYCHIATRIC DISORDERS Anxiety Depression    GI/Hepatic negative GI ROS, Neg liver ROS, hiatal hernia,   Endo/Other  negative endocrine ROS  Renal/GU negative Renal ROS     Musculoskeletal negative musculoskeletal ROS (+) Arthritis , Osteoarthritis,    Abdominal   Peds  Hematology negative hematology ROS (+)   Anesthesia Other Findings   Reproductive/Obstetrics negative OB ROS                             Anesthesia Physical  Anesthesia Plan  ASA: II  Anesthesia Plan: General   Post-op Pain Management:    Induction: Intravenous  PONV Risk Score and Plan: 3 and Ondansetron, Dexamethasone and Treatment may vary due to age or medical condition  Airway Management Planned: Oral ETT and LMA  Additional Equipment: None  Intra-op Plan:   Post-operative Plan: Extubation in OR  Informed Consent: I have reviewed the patients History and Physical, chart, labs and discussed the procedure including the risks, benefits and alternatives for the proposed anesthesia with the patient or authorized representative who has indicated his/her understanding and acceptance.     Dental advisory given  Plan Discussed with: CRNA  Anesthesia Plan Comments:          Anesthesia Quick Evaluation

## 2018-07-26 NOTE — Interval H&P Note (Signed)
History and Physical Interval Note:  07/26/2018 12:24 PM  Kimberly [name]  has presented today for surgery, with the diagnosis of RIGHT FLANK SUBCUTANEOUS MASSES X2, CM X 1.5CM, LEFT LATERAL CHEST WALL SUBCUTANEOUS MASSES.  The various methods of treatment have been discussed with the patient and family. After consideration of risks, benefits and other options for treatment, the patient has consented to  Procedure(s): REMOVAL OF RIGHT FLANK AND LEFT LATERAL CHEST WALL SUBCUTANEOUS MASSES (N/A) as a surgical intervention.  The patient's history has been reviewed, patient examined, no change in status, stable for surgery.  I have reviewed the patient's chart and labs.  Questions were answered to the patient's satisfaction.    I have re-reviewed the the patient's records, history, medications, and allergies.  I have re-examined the patient.  I again discussed intraoperative plans and goals of post-operative recovery.  The patient agrees to proceed.  Kimberly [name]  Oct 17, 1950 [id number]  Patient Care Team: Mayra Neer, MD as PCP - General (Family Medicine) Harriet Masson, DPM as Consulting Physician (Podiatry)  Patient Active Problem List   Diagnosis Date Noted  . Neck mass - 3.5cm posterior SQ 09/06/2013  . Tobacco abuse 09/06/2013  . Constipation, chronic 09/06/2013  . Lipoma of right forearm, left abdominal wall, left inner thigh 07/08/2010    Past Medical History:  Diagnosis Date  . Arthritis    osteoarthritis  . Bronchitis   . Complication of anesthesia    s/p bladder retention-"only time it happened"  . COPD (chronic obstructive pulmonary disease) ([place])   . Depression   . Generalized headaches   . Heart murmur    heard occ  . Hiatal hernia    small- no problems  . History of blood in urine    microscopic -no problems identified," occ." high protein in urine"  . History of goiter   . Hyperlipemia   . Hypertension   . Osteoporosis   . Sigmoid diverticulosis    pt  unaware  . Subcutaneous mass    RIGHT FKANK AND LEFT LATERAL CHEST WALL  . Wears glasses   . Wears glasses   . Wears partial dentures    Upper and lower    Past Surgical History:  Procedure Laterality Date  . BREAST EXCISIONAL BIOPSY Right pt unsure   fibrocystic tissue  . BREAST EXCISIONAL BIOPSY Left pt unsure   scarring  . BREAST SURGERY  4098,1191   x3 fibrocystic disease, radial scarring  . BUNIONECTOMY Right   . COLONOSCOPY  2011  . ESOPHAGOGASTRODUODENOSCOPY  2008  . HAMMER TOE SURGERY Right    4,5  . HAND SURGERY Left    x 2 digits"repair fron cuts of tendons" "caught in door"  . KNEE ARTHROSCOPY Left    torn cartilage  . LESION REMOVAL N/A 09/28/2013   Procedure: REMOVAL OF POSTERIOR NECK MASS;  Surgeon: Michael Boston, MD;  Location: [facility];  Service: General;  Laterality: N/A;  . LIPOMA EXCISION  [id number]   x4 -1 abd, 1 rt.. arm, 1 bil. thigh  . PARTIAL HYSTERECTOMY  1984   abnormal bleeding  . PARTIAL KNEE ARTHROPLASTY Left   . THYROID LOBECTOMY Left 1970's    Social History   Socioeconomic History  . Marital status: Married    Spouse name: Not on file  . Number of children: Not on file  . Years of education: Not on file  . Highest education level: Not on file  Occupational History  . Not on file  Social  Needs  . Financial resource strain: Not on file  . Food insecurity    Worry: Not on file    Inability: Not on file  . Transportation needs    Medical: Not on file    Non-medical: Not on file  Tobacco Use  . Smoking status: Current Every Day Smoker    Packs/day: 0.50    Years: 45.00    Pack years: 22.50    Types: Cigarettes  . Smokeless tobacco: Never Used  Substance and Sexual Activity  . Alcohol use: Yes    Comment: socially, once in a while  . Drug use: No  . Sexual activity: Not Currently    Birth control/protection: Surgical  Lifestyle  . Physical activity    Days per week: Not on file    Minutes per session: Not on file  . Stress:  Not on file  Relationships  . Social Herbalist on phone: Not on file    Gets together: Not on file    Attends religious service: Not on file    Active member of club or organization: Not on file    Attends meetings of clubs or organizations: Not on file    Relationship status: Not on file  . Intimate partner violence    Fear of current or ex partner: Not on file    Emotionally abused: Not on file    Physically abused: Not on file    Forced sexual activity: Not on file  Other Topics Concern  . Not on file  Social History Narrative  . Not on file    Family History  Problem Relation Age of Onset  . Heart disease Father   . Heart disease Sister        cardiac arrest  . Cancer Brother        lymphoma    Medications Prior to Admission  Medication Sig Dispense Refill Last Dose  . acetaminophen (TYLENOL) 500 MG tablet Take 1,000 mg by mouth every 6 (six) hours as needed for moderate pain or headache.    Past Week at Unknown time  . albuterol (VENTOLIN HFA) 108 (90 Base) MCG/ACT inhaler Inhale 2 puffs into the lungs every 4 (four) hours as needed for wheezing or shortness of breath.   07/26/2018 at 0900  . Calcium Carb-Cholecalciferol (CALCIUM 600+D3 PO) Take 1 tablet by mouth daily.   07/25/2018 at Unknown time  . Cholecalciferol (VITAMIN D) 1000 UNITS capsule Take 1,000 Units by mouth every morning.    07/25/2018 at Unknown time  . Coenzyme Q10 (COQ10) 100 MG CAPS Take 100 mg by mouth daily.   07/25/2018 at Unknown time  . furosemide (LASIX) 20 MG tablet Take 20-40 mg by mouth daily as needed for edema.   07/25/2018 at Unknown time  . lisinopril (PRINIVIL,ZESTRIL) 20 MG tablet Take 20 mg by mouth every morning.    07/25/2018 at Unknown time  . Magnesium 250 MG TABS Take 250 mg by mouth at bedtime.   07/25/2018 at Unknown time  . naproxen sodium (ALEVE) 220 MG tablet Take 220 mg by mouth as needed.   07/25/2018 at Unknown time  . OLANZapine (ZYPREXA) 2.5 MG tablet Take 5 mg by  mouth at bedtime.   07/25/2018 at Unknown time  . rosuvastatin (CRESTOR) 20 MG tablet Take 5 mg by mouth 2 (two) times a week. Monday and Friday   07/25/2018 at Unknown time  . traMADol (ULTRAM) 50 MG tablet Take 1-2 tablets (50-100 mg total)  by mouth every 6 (six) hours as needed for moderate pain or severe pain. (Patient taking differently: Take 50-100 mg by mouth 3 (three) times daily as needed for moderate pain or severe pain. ) 30 tablet 0 Past Week at Unknown time  . venlafaxine XR (EFFEXOR-XR) 75 MG 24 hr capsule Take 75 mg by mouth daily.   07/26/2018 at 0900  . denosumab (PROLIA) 60 MG/ML SOLN injection Inject 60 mg into the skin every 6 (six) months. Administer in upper arm, thigh, or abdomen   More than a month at Unknown time    Current Facility-Administered Medications  Medication Dose Route Frequency Provider Last Rate Last Dose  . Chlorhexidine Gluconate Cloth 2 % PADS 6 each  6 each Topical Once Michael Boston, MD       And  . Chlorhexidine Gluconate Cloth 2 % PADS 6 each  6 each Topical Once Michael Boston, MD      . clindamycin (CLEOCIN) IVPB 900 mg  900 mg Intravenous On Call to OR Michael Boston, MD       And  . gentamicin (GARAMYCIN) 280 mg in dextrose 5 % 100 mL IVPB  5 mg/kg Intravenous On Call to OR Michael Boston, MD      . lactated ringers infusion   Intravenous Continuous Myrtie Soman, MD 50 mL/hr at 07/26/18 1125       Allergies  Allergen Reactions  . Oxycodone Itching  . Penicillins Itching, Rash and Other (See Comments)    Did it involve swelling of the face/tongue/throat, SOB, or low BP? No Did it involve sudden or severe rash/hives, skin peeling, or any reaction on the inside of your mouth or nose? Yes Did you need to seek medical attention at a hospital or doctor's office? Yes When did it last happen? Long time ago If all above answers are "NO", may proceed with cephalosporin use.   . Sulfonamide Derivatives Rash    BP (!) 151/82   Pulse (!) 111   Temp 98.1  F (36.7 C) (Oral)   Resp 16   Ht 4\' 10"  (1.473 m)   Wt 56.3 kg   SpO2 98%   BMI 25.94 kg/m   Labs: Results for orders placed or performed during the hospital encounter of 07/26/18 (from the past 48 hour(s))  I-STAT, chem 8     Status: Abnormal   Collection Time: 07/26/18 11:12 AM  Result Value Ref Range   Sodium 140 135 - 145 mmol/L   Potassium 4.2 3.5 - 5.1 mmol/L   Chloride 104 98 - 111 mmol/L   BUN 20 8 - 23 mg/dL   Creatinine, Ser 1.20 (H) 0.44 - 1.00 mg/dL   Glucose, Bld 106 (H) 70 - 99 mg/dL   Calcium, Ion 1.29 1.15 - 1.40 mmol/L   TCO2 29 22 - 32 mmol/L   Hemoglobin 15.0 12.0 - 15.0 g/dL   HCT 44.0 36.0 - 46.0 %    Imaging / Studies: [place] Chest Soft Tissue  Result Date: 06/30/2018 CLINICAL DATA:  Skin nodule in the lateral aspect of the left chest/abdomen which the patient first noticed approximately 1 month ago. History of prior lipoma removal. EXAM: ULTRASOUND OF CHEST SOFT TISSUES TECHNIQUE: Ultrasound examination of the chest wall soft tissues was performed in the area of clinical concern. COMPARISON:  None. FINDINGS: Scanning was directed toward the region of concern as indicated by the patient. Only normal appearing subcutaneous fat lobules are identified. No discrete mass is seen. 0.9 cm in diameter nodule  does demonstrate slightly increased echogenicity compared to surrounding fat. No fluid collection or worrisome lesion is identified. IMPRESSION: Normal appearing subcutaneous fat lobules are identified in the region of concern. One of the lobular status demonstrate solidly increased echogenicity but no worrisome lesion is identified. Electronically Signed   By: Inge Rise M.D.   On: 06/30/2018 10:50     .Adin Hector, M.D., F.A.C.S. Gastrointestinal and Minimally Invasive Surgery [facility] [address], [place], [place] [address] [phone] Main / Paging  07/26/2018 12:25 PM     Adin Hector

## 2018-07-26 NOTE — Anesthesia Postprocedure Evaluation (Signed)
Anesthesia Post Note  Patient: Kimberly [name]  Procedure(s) Performed: REMOVAL OF RIGHT FLANK , LEFT LATERAL CHEST WALL AND RIGHT THIGH SUBCUTANEOUS MASSES (N/A Abdomen)     Patient location during evaluation: PACU Anesthesia Type: General Level of consciousness: sedated and patient cooperative Pain management: pain level controlled Vital Signs Assessment: post-procedure vital signs reviewed and stable Respiratory status: spontaneous breathing Cardiovascular status: stable Anesthetic complications: no    Last Vitals:  Vitals:   07/26/18 1430 07/26/18 1445  BP: (!) 156/84 (!) 155/84  Pulse: (!) 119 (!) 107  Resp: (!) 32 (!) 24  Temp: 36.7 C   SpO2: 94% 99%    Last Pain:  Vitals:   07/26/18 1445  TempSrc:   PainSc: 0-No pain                 Nolon Nations

## 2018-07-26 NOTE — Anesthesia Postprocedure Evaluation (Signed)
Patient has remained in PACU since 14:32 this afternoon requiring O2 via nasal cannula to keep SpO2>88%. Breath sounds CTAB, other VSS. When patient is asleep her SpO2 was as low as 83% on room air, despite use of incentive spirometer and Duoneb treatment. She is now awake and alert, SpO2 remains high 80s-low 90s on room air, she denies SOB. Overnight admission discussed with patient and patient would like to go home if possible. She states that she sometimes has SpO2 in mid-80s when she has outpatient appointments. Advised patient that she should followup with her PCP as soon as possible for further evaluation of possible need for home O2. Patient and her husband are amenable to plan for discharge.  Erasmo Downer, MD Anesthesiology

## 2018-07-26 NOTE — Addendum Note (Signed)
Addendum  created 07/26/18 1830 by Brennan Bailey, MD   Clinical Note Signed

## 2018-07-26 NOTE — Op Note (Signed)
07/26/2018  2:20 PM  PATIENT:  Kimberly [name]  68 y.o. female  Patient Care Team: Mayra Neer, MD as PCP - General (Family Medicine) Harriet Masson, DPM as Consulting Physician (Podiatry)  PRE-OPERATIVE DIAGNOSIS:   Bilateral chest wall and flank masses. Right thigh mass.   POST-OPERATIVE DIAGNOSIS:   Bilateral chest wall and flank masses. Right thigh mass.    PROCEDURE:   REMOVAL OF  Bilateral chest wall and flank masses. Right thigh mass.    SURGEON:  Adin Hector, MD  ASSISTANT: none   ANESTHESIA:   local and general  EBL:  Total I/O In: 900 [I.V.:900] Out: 25 [Blood:25]  Delay start of Pharmacological VTE agent (>24hrs) due to surgical blood loss or risk of bleeding:  no  DRAINS: none   SPECIMEN:  Source of Specimen:  Subcutaneous masses.  DISPOSITION OF SPECIMEN:  PATHOLOGY  COUNTS:  YES  PLAN OF CARE: Discharge to home after PACU  PATIENT DISPOSITION:  PACU - hemodynamically stable.  INDICATION: Pleasant woman with a history of subcutaneous masses consistent with fibrolipomatous.  They arise and cause pain and discomfort.  Has had numerous excisions in the past.  Has had recurrences on both chest walls and flanks as well as upper and lower extremities.  Left lateral chest wall and flank of the most bothersome but right side becoming more bothersome as well.  Obvious one on right anterior thigh as well.  Recommendation made for excision.  Given the numerous locations and some more deep on the chest wall, I recommended anesthesia with local backup to properly excised.  Risk of recurrence discussed.  Risk of bleeding and infection discussed.  The pathophysiology of skin & subcutaneous masses was discussed.  Natural history risks without surgery were discussed.  I recommended surgery to remove the mass.  I explained the technique of removal with use of local anesthesia & possible need for more aggressive sedation/anesthesia for patient comfort.    Risks such  as bleeding, infection, wound breakdown, heart attack, death, and other risks were discussed.  I noted a good likelihood this will help address the problem.   Possibility that this will not correct all symptoms was explained. Possibility of regrowth/recurrence of the mass was discussed.  We will work to minimize complications. Questions were answered.  The patient expresses understanding & wishes to proceed with surgery.  OR FINDINGS:   Numerous fibrous fatty subcutaneous nodules within already fibrocystic subcutaneous tissue.  Left posterior chest wall along posterior axillary line towards midclavicular line and flank.  4 dominant locations.  Largest 55 x 35 mm adherent to the ribs and left lateral flank fascia.  Smallest 1 x 1 mm and just subdermal.  Right right thigh 2 x 1 x 1 cm.  Right chest wall with 3 locations.  Largest 35 x 20 mm.  Most 15x10 mm in size    DESCRIPTION:   Informed consent was confirmed.   The patient received IV antibiotics & underwent general anesthesia without any difficulty. The patient was positioned supine. SCDs were active during the entire case.  Patient carefully positioned slightly left side up with beanbag and careful support.  Chest wall and abdomen prepped and draped in a sterile fashion.  A surgical timeout confirmed our plan.  I made transverse lesions over the subcutaneous nodules starting in the right thigh, then right chest wall, then left chest wall and flank.  Could excise rather well-circumscribed is fibrofatty masses consistent with her chronic history of fibrolipomatous.  One region  on the right anterior chest wall and 2 in the left lateral chest wall that were a little more Morphis.  Did sharp dissection to excise them.  Wounds packed.  Hemostasis ensured.  Wounds closed with interrupted 4 Monocryl sutures.  Steri-Strips placed on the smaller 5 incisions.  2 x 2's and Tegaderm on the larger 3 areas.  Abdominal binder gently placed for  counterpressure.  I discussed postoperative care with the patient in my office. I discussed in the holding area. Instructions are written.  I discussed operative findings, updated the patient's status, discussed probable steps to recovery, and gave postoperative recommendations to the patient's spouse.  Recommendations were made.  Questions were answered.  He expressed understanding & appreciation.    Adin Hector, M.D., F.A.C.S. Gastrointestinal and Minimally Invasive Surgery [facility] [address], [place], [place] [address] [phone] Main / Paging

## 2018-07-26 NOTE — Transfer of Care (Signed)
Immediate Anesthesia Transfer of Care Note  Patient: Kimberly [name]  Procedure(s) Performed: REMOVAL OF RIGHT FLANK , LEFT LATERAL CHEST WALL AND RIGHT THIGH SUBCUTANEOUS MASSES (N/A Abdomen)  Patient Location: PACU  Anesthesia Type:General  Level of Consciousness: sedated  Airway & Oxygen Therapy: Patient Spontanous Breathing and Patient connected to face mask oxygen  Post-op Assessment: Report given to RN and Post -op Vital signs reviewed and stable  Post vital signs: Reviewed and stable  Last Vitals:  Vitals Value Taken Time  BP 156/84 07/26/18 1430  Temp    Pulse 119 07/26/18 1432  Resp 33 07/26/18 1432  SpO2 92 % 07/26/18 1432  Vitals shown include unvalidated device data.  Last Pain:  Vitals:   07/26/18 1048  TempSrc: Oral  PainSc: 0-No pain      Patients Stated Pain Goal: 5 (26/37/85 8850)  Complications: No apparent anesthesia complications

## 2018-07-26 NOTE — Anesthesia Procedure Notes (Signed)
Procedure Name: Intubation Date/Time: 07/26/2018 12:52 PM Performed by: Cynda Familia, CRNA Pre-anesthesia Checklist: Patient identified, Emergency Drugs available, Suction available and Patient being monitored Patient Re-evaluated:Patient Re-evaluated prior to induction Oxygen Delivery Method: Circle System Utilized Preoxygenation: Pre-oxygenation with 100% oxygen Induction Type: IV induction Ventilation: Mask ventilation without difficulty Laryngoscope Size: Miller and 2 Grade View: Grade I Tube type: Oral Number of attempts: 1 Airway Equipment and Method: Stylet and Oral airway Placement Confirmation: ETT inserted through vocal cords under direct vision,  positive ETCO2 and breath sounds checked- equal and bilateral Secured at: 20 cm Tube secured with: Tape Dental Injury: Teeth and Oropharynx as per pre-operative assessment  Comments: Smooth IV induction [place]-- intubation AM CRNA atraumatic-- teeth and mouth as preop- missing teeth-- bilat BS Germ[place]

## 2018-07-26 NOTE — Anesthesia Procedure Notes (Signed)
Date/Time: 07/26/2018 2:25 PM Performed by: Cynda Familia, CRNA Oxygen Delivery Method: Simple face mask Placement Confirmation: positive ETCO2 and breath sounds checked- equal and bilateral Dental Injury: Teeth and Oropharynx as per pre-operative assessment

## 2018-07-27 ENCOUNTER — Encounter (HOSPITAL_BASED_OUTPATIENT_CLINIC_OR_DEPARTMENT_OTHER): Payer: Self-pay | Admitting: Surgery

## 2018-08-03 DIAGNOSIS — J449 Chronic obstructive pulmonary disease, unspecified: Secondary | ICD-10-CM | POA: Diagnosis not present

## 2018-08-03 DIAGNOSIS — G4730 Sleep apnea, unspecified: Secondary | ICD-10-CM | POA: Diagnosis not present

## 2018-08-11 DIAGNOSIS — M25562 Pain in left knee: Secondary | ICD-10-CM | POA: Diagnosis not present

## 2018-08-15 DIAGNOSIS — G4733 Obstructive sleep apnea (adult) (pediatric): Secondary | ICD-10-CM | POA: Diagnosis not present

## 2018-08-16 DIAGNOSIS — G4733 Obstructive sleep apnea (adult) (pediatric): Secondary | ICD-10-CM | POA: Diagnosis not present

## 2018-09-02 DIAGNOSIS — N183 Chronic kidney disease, stage 3 (moderate): Secondary | ICD-10-CM | POA: Diagnosis not present

## 2018-09-02 DIAGNOSIS — R319 Hematuria, unspecified: Secondary | ICD-10-CM | POA: Diagnosis not present

## 2018-09-02 DIAGNOSIS — E559 Vitamin D deficiency, unspecified: Secondary | ICD-10-CM | POA: Diagnosis not present

## 2018-09-02 DIAGNOSIS — E890 Postprocedural hypothyroidism: Secondary | ICD-10-CM | POA: Diagnosis not present

## 2018-09-02 DIAGNOSIS — I129 Hypertensive chronic kidney disease with stage 1 through stage 4 chronic kidney disease, or unspecified chronic kidney disease: Secondary | ICD-10-CM | POA: Diagnosis not present

## 2018-09-02 DIAGNOSIS — E782 Mixed hyperlipidemia: Secondary | ICD-10-CM | POA: Diagnosis not present

## 2018-09-08 DIAGNOSIS — E782 Mixed hyperlipidemia: Secondary | ICD-10-CM | POA: Diagnosis not present

## 2018-09-08 DIAGNOSIS — E890 Postprocedural hypothyroidism: Secondary | ICD-10-CM | POA: Diagnosis not present

## 2018-09-08 DIAGNOSIS — Z720 Tobacco use: Secondary | ICD-10-CM | POA: Diagnosis not present

## 2018-09-08 DIAGNOSIS — M810 Age-related osteoporosis without current pathological fracture: Secondary | ICD-10-CM | POA: Diagnosis not present

## 2018-09-08 DIAGNOSIS — E559 Vitamin D deficiency, unspecified: Secondary | ICD-10-CM | POA: Diagnosis not present

## 2018-09-08 DIAGNOSIS — Z7189 Other specified counseling: Secondary | ICD-10-CM | POA: Diagnosis not present

## 2018-09-08 DIAGNOSIS — F322 Major depressive disorder, single episode, severe without psychotic features: Secondary | ICD-10-CM | POA: Diagnosis not present

## 2018-09-08 DIAGNOSIS — G4733 Obstructive sleep apnea (adult) (pediatric): Secondary | ICD-10-CM | POA: Diagnosis not present

## 2018-09-08 DIAGNOSIS — I129 Hypertensive chronic kidney disease with stage 1 through stage 4 chronic kidney disease, or unspecified chronic kidney disease: Secondary | ICD-10-CM | POA: Diagnosis not present

## 2018-09-08 DIAGNOSIS — J449 Chronic obstructive pulmonary disease, unspecified: Secondary | ICD-10-CM | POA: Diagnosis not present

## 2018-09-08 DIAGNOSIS — Z0000 Encounter for general adult medical examination without abnormal findings: Secondary | ICD-10-CM | POA: Diagnosis not present

## 2018-09-08 DIAGNOSIS — N183 Chronic kidney disease, stage 3 (moderate): Secondary | ICD-10-CM | POA: Diagnosis not present

## 2018-09-14 DIAGNOSIS — M810 Age-related osteoporosis without current pathological fracture: Secondary | ICD-10-CM | POA: Diagnosis not present

## 2018-09-14 DIAGNOSIS — Z23 Encounter for immunization: Secondary | ICD-10-CM | POA: Diagnosis not present

## 2018-10-10 DIAGNOSIS — G4733 Obstructive sleep apnea (adult) (pediatric): Secondary | ICD-10-CM | POA: Diagnosis not present

## 2018-11-09 DIAGNOSIS — G4733 Obstructive sleep apnea (adult) (pediatric): Secondary | ICD-10-CM | POA: Diagnosis not present

## 2018-11-10 DIAGNOSIS — G4733 Obstructive sleep apnea (adult) (pediatric): Secondary | ICD-10-CM | POA: Diagnosis not present

## 2018-12-10 DIAGNOSIS — G4733 Obstructive sleep apnea (adult) (pediatric): Secondary | ICD-10-CM | POA: Diagnosis not present

## 2018-12-26 DIAGNOSIS — G4733 Obstructive sleep apnea (adult) (pediatric): Secondary | ICD-10-CM | POA: Diagnosis not present

## 2019-02-09 DIAGNOSIS — G4733 Obstructive sleep apnea (adult) (pediatric): Secondary | ICD-10-CM | POA: Diagnosis not present

## 2019-03-06 DIAGNOSIS — R3121 Asymptomatic microscopic hematuria: Secondary | ICD-10-CM | POA: Diagnosis not present

## 2019-03-12 DIAGNOSIS — G4733 Obstructive sleep apnea (adult) (pediatric): Secondary | ICD-10-CM | POA: Diagnosis not present

## 2019-03-15 ENCOUNTER — Ambulatory Visit
Admission: RE | Admit: 2019-03-15 | Discharge: 2019-03-15 | Disposition: A | Discharge status: Home / Self Care | Payer: PPO | Source: Ambulatory Visit | Attending: Family Medicine | Admitting: Family Medicine

## 2019-03-15 ENCOUNTER — Other Ambulatory Visit: Payer: Self-pay | Admitting: Family Medicine

## 2019-03-15 DIAGNOSIS — I129 Hypertensive chronic kidney disease with stage 1 through stage 4 chronic kidney disease, or unspecified chronic kidney disease: Secondary | ICD-10-CM | POA: Diagnosis not present

## 2019-03-15 DIAGNOSIS — F322 Major depressive disorder, single episode, severe without psychotic features: Secondary | ICD-10-CM | POA: Diagnosis not present

## 2019-03-15 DIAGNOSIS — M810 Age-related osteoporosis without current pathological fracture: Secondary | ICD-10-CM | POA: Diagnosis not present

## 2019-03-15 DIAGNOSIS — R000 Tachycardia, unspecified: Secondary | ICD-10-CM

## 2019-03-15 DIAGNOSIS — R0602 Shortness of breath: Secondary | ICD-10-CM | POA: Diagnosis not present

## 2019-03-15 DIAGNOSIS — J449 Chronic obstructive pulmonary disease, unspecified: Secondary | ICD-10-CM | POA: Diagnosis not present

## 2019-03-15 DIAGNOSIS — N183 Chronic kidney disease, stage 3 unspecified: Secondary | ICD-10-CM | POA: Diagnosis not present

## 2019-03-15 DIAGNOSIS — E782 Mixed hyperlipidemia: Secondary | ICD-10-CM | POA: Diagnosis not present

## 2019-03-15 DIAGNOSIS — Z720 Tobacco use: Secondary | ICD-10-CM | POA: Diagnosis not present

## 2019-03-27 DIAGNOSIS — G4733 Obstructive sleep apnea (adult) (pediatric): Secondary | ICD-10-CM | POA: Diagnosis not present

## 2019-04-09 DIAGNOSIS — G4733 Obstructive sleep apnea (adult) (pediatric): Secondary | ICD-10-CM | POA: Diagnosis not present

## 2019-04-20 DIAGNOSIS — M189 Osteoarthritis of first carpometacarpal joint, unspecified: Secondary | ICD-10-CM | POA: Diagnosis not present

## 2019-04-20 DIAGNOSIS — Z96652 Presence of left artificial knee joint: Secondary | ICD-10-CM | POA: Diagnosis not present

## 2019-04-20 DIAGNOSIS — M25562 Pain in left knee: Secondary | ICD-10-CM | POA: Diagnosis not present

## 2019-04-20 DIAGNOSIS — M1712 Unilateral primary osteoarthritis, left knee: Secondary | ICD-10-CM | POA: Diagnosis not present

## 2019-04-20 DIAGNOSIS — M79642 Pain in left hand: Secondary | ICD-10-CM | POA: Diagnosis not present

## 2019-05-02 ENCOUNTER — Other Ambulatory Visit: Payer: Self-pay | Admitting: Family Medicine

## 2019-05-02 DIAGNOSIS — Z1231 Encounter for screening mammogram for malignant neoplasm of breast: Secondary | ICD-10-CM

## 2019-05-08 ENCOUNTER — Other Ambulatory Visit: Payer: Self-pay | Admitting: Family Medicine

## 2019-05-08 DIAGNOSIS — N644 Mastodynia: Secondary | ICD-10-CM

## 2019-05-10 DIAGNOSIS — F17200 Nicotine dependence, unspecified, uncomplicated: Secondary | ICD-10-CM | POA: Diagnosis not present

## 2019-05-10 DIAGNOSIS — G4733 Obstructive sleep apnea (adult) (pediatric): Secondary | ICD-10-CM | POA: Diagnosis not present

## 2019-05-10 DIAGNOSIS — R0789 Other chest pain: Secondary | ICD-10-CM | POA: Diagnosis not present

## 2019-05-12 ENCOUNTER — Emergency Department (HOSPITAL_COMMUNITY): Payer: PPO

## 2019-05-12 ENCOUNTER — Encounter (HOSPITAL_COMMUNITY): Payer: Self-pay

## 2019-05-12 ENCOUNTER — Emergency Department (HOSPITAL_COMMUNITY)
Admission: EM | Admit: 2019-05-12 | Discharge: 2019-05-12 | Disposition: A | Discharge status: Home / Self Care | Payer: PPO | Attending: Emergency Medicine | Admitting: Emergency Medicine

## 2019-05-12 ENCOUNTER — Emergency Department (HOSPITAL_BASED_OUTPATIENT_CLINIC_OR_DEPARTMENT_OTHER)
Admission: EM | Admit: 2019-05-12 | Discharge: 2019-05-12 | Disposition: A | Discharge status: Home / Self Care | Payer: PPO | Source: Home / Self Care | Attending: Emergency Medicine | Admitting: Emergency Medicine

## 2019-05-12 ENCOUNTER — Other Ambulatory Visit: Payer: Self-pay

## 2019-05-12 DIAGNOSIS — J441 Chronic obstructive pulmonary disease with (acute) exacerbation: Secondary | ICD-10-CM | POA: Insufficient documentation

## 2019-05-12 DIAGNOSIS — E860 Dehydration: Secondary | ICD-10-CM | POA: Diagnosis not present

## 2019-05-12 DIAGNOSIS — R000 Tachycardia, unspecified: Secondary | ICD-10-CM | POA: Diagnosis not present

## 2019-05-12 DIAGNOSIS — F17210 Nicotine dependence, cigarettes, uncomplicated: Secondary | ICD-10-CM | POA: Insufficient documentation

## 2019-05-12 DIAGNOSIS — R05 Cough: Secondary | ICD-10-CM | POA: Insufficient documentation

## 2019-05-12 DIAGNOSIS — R0789 Other chest pain: Secondary | ICD-10-CM | POA: Diagnosis not present

## 2019-05-12 DIAGNOSIS — R2242 Localized swelling, mass and lump, left lower limb: Secondary | ICD-10-CM | POA: Diagnosis not present

## 2019-05-12 DIAGNOSIS — I10 Essential (primary) hypertension: Secondary | ICD-10-CM | POA: Diagnosis not present

## 2019-05-12 DIAGNOSIS — J90 Pleural effusion, not elsewhere classified: Secondary | ICD-10-CM | POA: Insufficient documentation

## 2019-05-12 DIAGNOSIS — Z20822 Contact with and (suspected) exposure to covid-19: Secondary | ICD-10-CM | POA: Insufficient documentation

## 2019-05-12 DIAGNOSIS — R52 Pain, unspecified: Secondary | ICD-10-CM | POA: Diagnosis not present

## 2019-05-12 DIAGNOSIS — M7989 Other specified soft tissue disorders: Secondary | ICD-10-CM

## 2019-05-12 DIAGNOSIS — R0689 Other abnormalities of breathing: Secondary | ICD-10-CM | POA: Diagnosis not present

## 2019-05-12 DIAGNOSIS — R091 Pleurisy: Secondary | ICD-10-CM

## 2019-05-12 DIAGNOSIS — Z79899 Other long term (current) drug therapy: Secondary | ICD-10-CM | POA: Diagnosis not present

## 2019-05-12 DIAGNOSIS — R0602 Shortness of breath: Secondary | ICD-10-CM | POA: Diagnosis not present

## 2019-05-12 DIAGNOSIS — R079 Chest pain, unspecified: Secondary | ICD-10-CM | POA: Diagnosis not present

## 2019-05-12 LAB — COMPREHENSIVE METABOLIC PANEL
ALT: 11 U/L (ref 0–44)
AST: 15 U/L (ref 15–41)
Albumin: 3.1 g/dL — ABNORMAL LOW (ref 3.5–5.0)
Alkaline Phosphatase: 97 U/L (ref 38–126)
Anion gap: 8 (ref 5–15)
BUN: 19 mg/dL (ref 8–23)
CO2: 28 mmol/L (ref 22–32)
Calcium: 9.3 mg/dL (ref 8.9–10.3)
Chloride: 102 mmol/L (ref 98–111)
Creatinine, Ser: 0.98 mg/dL (ref 0.44–1.00)
GFR calc Af Amer: >60 mL/min (ref >60)
GFR calc non Af Amer: 59 mL/min — ABNORMAL LOW (ref >60)
Glucose, Bld: 128 mg/dL — ABNORMAL HIGH (ref 70–99)
Potassium: 4.5 mmol/L (ref 3.5–5.1)
Sodium: 138 mmol/L (ref 135–145)
Total Bilirubin: 0.6 mg/dL (ref 0.3–1.2)
Total Protein: 7 g/dL (ref 6.5–8.1)

## 2019-05-12 LAB — CBC
HCT: 40.4 % (ref 36.0–46.0)
Hemoglobin: 12.8 g/dL (ref 12.0–15.0)
MCH: 29.7 pg (ref 26.0–34.0)
MCHC: 31.7 g/dL (ref 30.0–36.0)
MCV: 93.7 fL (ref 80.0–100.0)
Platelets: 329 10*3/uL (ref 150–400)
RBC: 4.31 MIL/uL (ref 3.87–5.11)
RDW: 14.8 % (ref 11.5–15.5)
WBC: 9.6 10*3/uL (ref 4.0–10.5)
nRBC: 0 % (ref 0.0–0.2)

## 2019-05-12 LAB — APTT: aPTT: 30 seconds (ref 24–36)

## 2019-05-12 LAB — TROPONIN I (HIGH SENSITIVITY)
Troponin I (High Sensitivity): 3 ng/L (ref <18)
Troponin I (High Sensitivity): 4 ng/L (ref <18)

## 2019-05-12 LAB — PROTIME-INR
INR: 1 (ref 0.8–1.2)
Prothrombin Time: 12.3 seconds (ref 11.4–15.2)

## 2019-05-12 LAB — RESPIRATORY PANEL BY RT PCR (FLU A&B, COVID)
Influenza A by PCR: NEGATIVE
Influenza B by PCR: NEGATIVE
SARS Coronavirus 2 by RT PCR: NEGATIVE

## 2019-05-12 LAB — D-DIMER, QUANTITATIVE: D-Dimer, Quant: 5.42 ug/mL-FEU — ABNORMAL HIGH (ref 0.00–0.50)

## 2019-05-12 LAB — BRAIN NATRIURETIC PEPTIDE: B Natriuretic Peptide: 24.7 pg/mL (ref 0.0–100.0)

## 2019-05-12 MED ORDER — SODIUM CHLORIDE (PF) 0.9 % IJ SOLN
INTRAMUSCULAR | Status: AC
  Filled 2019-05-12: qty 50

## 2019-05-12 MED ORDER — PREDNISONE 50 MG PO TABS
50.0000 mg | ORAL_TABLET | Freq: Once | ORAL | Status: AC
  Administered 2019-05-12: 50 mg via ORAL
  Filled 2019-05-12: qty 1

## 2019-05-12 MED ORDER — DOXYCYCLINE HYCLATE 100 MG PO TABS
100.0000 mg | ORAL_TABLET | Freq: Two times a day (BID) | ORAL | 0 refills | Status: DC
Start: 2019-05-12 — End: 2019-06-22

## 2019-05-12 MED ORDER — SODIUM CHLORIDE 0.9 % IV BOLUS (SEPSIS)
1000.0000 mL | Freq: Once | INTRAVENOUS | Status: AC
  Administered 2019-05-12: 1000 mL via INTRAVENOUS

## 2019-05-12 MED ORDER — ALBUTEROL SULFATE HFA 108 (90 BASE) MCG/ACT IN AERS
4.0000 | INHALATION_SPRAY | Freq: Once | RESPIRATORY_TRACT | Status: AC
  Administered 2019-05-12: 4 via RESPIRATORY_TRACT
  Filled 2019-05-12: qty 6.7

## 2019-05-12 MED ORDER — HEPARIN (PORCINE) 25000 UT/250ML-% IV SOLN
900.0000 [IU]/h | INTRAVENOUS | Status: DC
  Administered 2019-05-12: 900 [IU]/h via INTRAVENOUS
  Filled 2019-05-12: qty 250

## 2019-05-12 MED ORDER — SODIUM CHLORIDE 0.9 % IV SOLN: 1000.0000 mL | INTRAVENOUS | Status: DC

## 2019-05-12 MED ORDER — PREDNISONE 50 MG PO TABS
50.0000 mg | ORAL_TABLET | Freq: Every day | ORAL | 0 refills | Status: DC
Start: 2019-05-12 — End: 2019-06-22

## 2019-05-12 MED ORDER — HEPARIN BOLUS VIA INFUSION
3500.0000 [IU] | Freq: Once | INTRAVENOUS | Status: AC
  Administered 2019-05-12: 3500 [IU] via INTRAVENOUS
  Filled 2019-05-12: qty 3500

## 2019-05-12 MED ORDER — IOHEXOL 350 MG/ML SOLN
100.0000 mL | Freq: Once | INTRAVENOUS | Status: AC | PRN
  Administered 2019-05-12: 13:00:00 51 mL via INTRAVENOUS

## 2019-05-12 NOTE — Progress Notes (Signed)
Left lower extremity venous duplex has been completed. Preliminary results can be found in CV Proc through chart review.  Results were given to Dr. Tomi Bamberger.  05/12/19 1:58 PM Kimberly [name] RVT

## 2019-05-12 NOTE — ED Notes (Signed)
Pt ambulated to restroom without difficulty

## 2019-05-12 NOTE — Discharge Instructions (Signed)
Take the medications as prescribed.  Follow-up with your doctor to make sure your symptoms are improving.  Repeat chest x-ray is also recommended to make sure the infusions are resolving.  Return to the ED as needed for worsening symptoms

## 2019-05-12 NOTE — ED Provider Notes (Signed)
[place] DEPT Provider Note   CSN: [id number] Arrival date & time: 05/12/19  [id number]     History Chief Complaint  Patient presents with  . Chest Pain  . Shortness of Breath    Kimberly [name] is a 69 y.o. female.  HPI    Patient presents the emergency room for evaluation of shortness of breath and pain in her bilateral lower chest.  Patient states that symptoms started a few days ago.  Initially she was having on both the left and the right side.  It seems to be more on the right side now.  She spoke to her doctor and was prescribed some anti-inflammatory medications.  Patient feels like the symptoms are worsening.  She is having difficulty breathing now.  She denies any fevers but she has been coughing.  She has noted some swelling in her left leg but she has had chronic issues with that ever since she had her knee surgery.  She denies any abdominal pain.  No vomiting or diarrhea.  Patient is not usually on oxygen at home.  Patient denies any Covid exposure.  She has not been vaccinated  Past Medical History:  Diagnosis Date  . Arthritis    osteoarthritis  . Bronchitis   . Complication of anesthesia    s/p bladder retention-"only time it happened"  . COPD (chronic obstructive pulmonary disease) ([place])   . Depression   . Generalized headaches   . Heart murmur    heard occ  . Hiatal hernia    small- no problems  . History of blood in urine    microscopic -no problems identified," occ." high protein in urine"  . History of goiter   . Hyperlipemia   . Hypertension   . Osteoporosis   . Sigmoid diverticulosis    pt unaware  . Subcutaneous mass    RIGHT FKANK AND LEFT LATERAL CHEST WALL  . Wears glasses   . Wears glasses   . Wears partial dentures    Upper and lower    Patient Active Problem List   Diagnosis Date Noted  . Fibrolipomas of trunk and extremities. 07/26/2018  . Neck mass - 3.5cm posterior SQ 09/06/2013  . Tobacco abuse  09/06/2013  . Constipation, chronic 09/06/2013  . Lipoma of right forearm, left abdominal wall, left inner thigh 07/08/2010    Past Surgical History:  Procedure Laterality Date  . BREAST EXCISIONAL BIOPSY Right pt unsure   fibrocystic tissue  . BREAST EXCISIONAL BIOPSY Left pt unsure   scarring  . BREAST SURGERY  [id number]   x3 fibrocystic disease, radial scarring  . BUNIONECTOMY Right   . COLONOSCOPY  2011  . ESOPHAGOGASTRODUODENOSCOPY  2008  . HAMMER TOE SURGERY Right    4,5  . HAND SURGERY Left    x 2 digits"repair fron cuts of tendons" "caught in door"  . KNEE ARTHROSCOPY Left    torn cartilage  . LESION REMOVAL N/A 09/28/2013   Procedure: REMOVAL OF POSTERIOR NECK MASS;  Surgeon: Michael Boston, MD;  Location: [facility];  Service: General;  Laterality: N/A;  . LIPOMA EXCISION  [id number]   x4 -1 abd, 1 rt.. arm, 1 bil. thigh  . MASS EXCISION N/A 07/26/2018   Procedure: REMOVAL OF RIGHT FLANK , LEFT LATERAL CHEST WALL AND RIGHT THIGH SUBCUTANEOUS MASSES;  Surgeon: Michael Boston, MD;  Location: [facility];  Service: General;  Laterality: N/A;  . PARTIAL HYSTERECTOMY  1984   abnormal bleeding  .  PARTIAL KNEE ARTHROPLASTY Left   . THYROID LOBECTOMY Left 1970's     OB History   No obstetric history on file.     Family History  Problem Relation Age of Onset  . Heart disease Father   . Heart disease Sister        cardiac arrest  . Cancer Brother        lymphoma    Social History   Tobacco Use  . Smoking status: Current Every Day Smoker    Packs/day: 0.50    Years: 45.00    Pack years: 22.50    Types: Cigarettes  . Smokeless tobacco: Never Used  Substance Use Topics  . Alcohol use: Yes    Comment: socially, once in a while  . Drug use: No    Home Medications Prior to Admission medications   Medication Sig Start Date End Date Taking? Authorizing Provider  acetaminophen (TYLENOL) 500 MG tablet Take 1,000 mg by mouth every 6 (six) hours as needed  for moderate pain or headache.    Yes [provider]  albuterol (VENTOLIN HFA) 108 (90 Base) MCG/ACT inhaler Inhale 2 puffs into the lungs every 4 (four) hours as needed for wheezing or shortness of breath.   Yes [provider]  Calcium Carb-Cholecalciferol (CALCIUM 600+D3 PO) Take 1 tablet by mouth daily.   Yes [provider]  Cholecalciferol (VITAMIN D) 1000 UNITS capsule Take 1,000 Units by mouth every morning.    Yes [provider]  Coenzyme Q10 (COQ10) 100 MG CAPS Take 100 mg by mouth daily.   Yes [provider]  denosumab (PROLIA) 60 MG/ML SOLN injection Inject 60 mg into the skin every 6 (six) months. Administer in upper arm, thigh, or abdomen   Yes [provider]  diclofenac (VOLTAREN) 50 MG EC tablet Take 50 mg by mouth 2 (two) times daily.   Yes [provider]  lisinopril (PRINIVIL,ZESTRIL) 20 MG tablet Take 20 mg by mouth every morning.    Yes [provider]  LIVALO 2 MG TABS Take 1 tablet by mouth daily. 04/30/19  Yes [provider]  Magnesium 250 MG TABS Take 250 mg by mouth at bedtime.   Yes [provider]  methocarbamol (ROBAXIN) 500 MG tablet Take 500 mg by mouth 3 (three) times daily.   Yes [provider]  naproxen sodium (ALEVE) 220 MG tablet Take 220 mg by mouth as needed.   Yes [provider]  OLANZapine (ZYPREXA) 2.5 MG tablet Take 5 mg by mouth at bedtime.   Yes [provider]  venlafaxine XR (EFFEXOR-XR) 75 MG 24 hr capsule Take 75 mg by mouth daily. 05/30/18  Yes [provider]  doxycycline (VIBRA-TABS) 100 MG tablet Take 1 tablet (100 mg total) by mouth 2 (two) times daily. 05/12/19   Dorie Rank, MD  predniSONE (DELTASONE) 50 MG tablet Take 1 tablet (50 mg total) by mouth daily. 05/12/19   Dorie Rank, MD  traMADol (ULTRAM) 50 MG tablet Take 1-2 tablets (50-100 mg total) by mouth every 6 (six) hours as needed for moderate pain or severe  pain. Patient not taking: Reported on 05/12/2019 07/26/18   Michael Boston, MD    Allergies    Oxycodone, Penicillins, and Sulfonamide derivatives  Review of Systems   Review of Systems  All other systems reviewed and are negative.   Physical Exam Updated Vital Signs BP 108/79   Pulse (!) 115   Temp 98.6 F (37 C) (Oral)  Resp (!) 31   Ht 1.499 m (4\' 11" )   Wt 54 kg   SpO2 95%   BMI 24.04 kg/m   Physical Exam Vitals and nursing note reviewed.  Constitutional:      General: She is not in acute distress.    Appearance: She is well-developed.  HENT:     Head: Normocephalic and atraumatic.     Right Ear: External ear normal.     Left Ear: External ear normal.  Eyes:     General: No scleral icterus.       Right eye: No discharge.        Left eye: No discharge.     Conjunctiva/sclera: Conjunctivae normal.  Neck:     Trachea: No tracheal deviation.  Cardiovascular:     Rate and Rhythm: Regular rhythm. Tachycardia present.  Pulmonary:     Effort: No respiratory distress.     Breath sounds: No stridor. Examination of the right-upper field reveals rales. Examination of the left-upper field reveals rales. Rales present. No wheezing.  Abdominal:     General: Bowel sounds are normal. There is no distension.     Palpations: Abdomen is soft.     Tenderness: There is no abdominal tenderness. There is no guarding or rebound.  Musculoskeletal:        General: No tenderness.     Cervical back: Neck supple.     Right lower leg: No tenderness. No edema.     Left lower leg: No tenderness. No edema.  Skin:    General: Skin is warm and dry.     Findings: No rash.  Neurological:     Mental Status: She is alert.     Cranial Nerves: No cranial nerve deficit (no facial droop, extraocular movements intact, no slurred speech).     Sensory: No sensory deficit.     Motor: No abnormal muscle tone or seizure activity.     Coordination: Coordination normal.     ED Results / Procedures /  Treatments   Labs (all labs ordered are listed, but only abnormal results are displayed) Labs Reviewed  COMPREHENSIVE METABOLIC PANEL - Abnormal; Notable for the following components:      Result Value   Glucose, Bld 128 (*)    Albumin 3.1 (*)    GFR calc non Af Amer 59 (*)    All other components within normal limits  D-DIMER, QUANTITATIVE (NOT AT [facility]) - Abnormal; Notable for the following components:   D-Dimer, Quant 5.42 (*)    All other components within normal limits  RESPIRATORY PANEL BY RT PCR (FLU A&B, COVID)  CBC  BRAIN NATRIURETIC PEPTIDE  APTT  PROTIME-INR  HEPARIN LEVEL (UNFRACTIONATED)  TROPONIN I (HIGH SENSITIVITY)  TROPONIN I (HIGH SENSITIVITY)    EKG None Sinus tachycardia rate 125 Normal axis normal intervals Normal ST-T waves  Radiology CT Angio Chest PE W and/or Wo Contrast  Result Date: 05/12/2019 CLINICAL DATA:  RIGHT-sided chest/rib pain. EXAM: CT ANGIOGRAPHY CHEST WITH CONTRAST TECHNIQUE: Multidetector CT imaging of the chest was performed using the standard protocol during bolus administration of intravenous contrast. Multiplanar CT image reconstructions and MIPs were obtained to evaluate the vascular anatomy. CONTRAST:  35mL OMNIPAQUE IOHEXOL 350 MG/ML SOLN COMPARISON:  None. FINDINGS: Cardiovascular: Some of the most peripheral segmental and subsegmental pulmonary artery branches are difficult to definitively characterize due to patient breathing motion artifact, however, there is no pulmonary embolism identified within the main, lobar or central segmental pulmonary arteries bilaterally. No thoracic  aortic aneurysm or evidence of aortic dissection. Scattered aortic atherosclerosis. Heart size is normal. No pericardial effusion. Mediastinum/Nodes: No mass or enlarged lymph nodes seen within the mediastinum or perihilar regions. Esophagus appears normal. Lungs/Pleura: Small partially loculated pleural effusions at each lung base. Associated mild bibasilar  atelectasis. Lungs otherwise clear. Upper Abdomen: Limited images of the upper abdomen are unremarkable. Musculoskeletal: No acute or suspicious osseous finding. Superficial soft tissues about the chest are unremarkable. Review of the MIP images confirms the above findings. IMPRESSION: 1. Small partially loculated pleural effusions at each lung base. Associated mild bibasilar atelectasis. No evidence of pneumonia or pulmonary edema. 2. No pulmonary embolism seen. Aortic Atherosclerosis (ICD10-I70.0). Electronically Signed   By: Franki Cabot M.D.   On: 05/12/2019 13:08   DG Chest Port 1 View  Result Date: 05/12/2019 CLINICAL DATA:  Shortness of breath.  Tachycardia. EXAM: PORTABLE CHEST 1 VIEW COMPARISON:  03/15/2019. FINDINGS: Mediastinum hilar structures normal. Right base atelectasis/infiltrate. Small right pleural effusion. Tiny left pleural effusion cannot be excluded. No pneumothorax. Heart size normal. Degenerative changes scoliosis thoracic spine IMPRESSION: Right base atelectasis/infiltrate. Small right pleural effusion. Tiny left pleural effusion cannot be excluded. Electronically Signed   By: Marcello Moores  Register   On: 05/12/2019 11:19   VAS [place] LOWER EXTREMITY VENOUS (DVT) (ONLY [facility] & [facility])  Result Date: 05/12/2019  Lower Venous DVTStudy Indications: Swelling.  Risk Factors: None identified. Comparison Study: No prior studies. Performing Technologist: Oliver Hum RVT  Examination Guidelines: A complete evaluation includes B-mode imaging, spectral Doppler, color Doppler, and power Doppler as needed of all accessible portions of each vessel. Bilateral testing is considered an integral part of a complete examination. Limited examinations for reoccurring indications may be performed as noted. The reflux portion of the exam is performed with the patient in reverse Trendelenburg.  +-----+---------------+---------+-----------+----------+--------------+  RIGHTCompressibilityPhasicitySpontaneityPropertiesThrombus Aging +-----+---------------+---------+-----------+----------+--------------+ CFV  Full           Yes      Yes                                 +-----+---------------+---------+-----------+----------+--------------+   +---------+---------------+---------+-----------+----------+--------------+ LEFT     CompressibilityPhasicitySpontaneityPropertiesThrombus Aging +---------+---------------+---------+-----------+----------+--------------+ CFV      Full           Yes      Yes                                 +---------+---------------+---------+-----------+----------+--------------+ SFJ      Full                                                        +---------+---------------+---------+-----------+----------+--------------+ FV Prox  Full                                                        +---------+---------------+---------+-----------+----------+--------------+ FV Mid   Full                                                        +---------+---------------+---------+-----------+----------+--------------+  FV DistalFull                                                        +---------+---------------+---------+-----------+----------+--------------+ PFV      Full                                                        +---------+---------------+---------+-----------+----------+--------------+ POP      Full           Yes      Yes                                 +---------+---------------+---------+-----------+----------+--------------+ PTV      Full                                                        +---------+---------------+---------+-----------+----------+--------------+ PERO     Full                                                        +---------+---------------+---------+-----------+----------+--------------+     Summary: RIGHT: - No evidence of common femoral vein  obstruction.  LEFT: - There is no evidence of deep vein thrombosis in the lower extremity.  - No cystic structure found in the popliteal fossa.  *See table(s) above for measurements and observations.    Preliminary     Procedures Procedures (including critical care time)  Medications Ordered in ED Medications  sodium chloride 0.9 % bolus 1,000 mL (1,000 mLs Intravenous New Bag/Given 05/12/19 1057)    Followed by  0.9 %  sodium chloride infusion (has no administration in time range)  heparin ADULT infusion 100 units/mL (25000 units/241mL sodium chloride 0.45%) (900 Units/hr Intravenous New Bag/Given 05/12/19 1317)  sodium chloride (PF) 0.9 % injection (has no administration in time range)  albuterol (VENTOLIN HFA) 108 (90 Base) MCG/ACT inhaler 4 puff (has no administration in time range)  predniSONE (DELTASONE) tablet 50 mg (has no administration in time range)  heparin bolus via infusion 3,500 Units (3,500 Units Intravenous Bolus from Bag 05/12/19 1317)  iohexol (OMNIPAQUE) 350 MG/ML injection 100 mL (51 mLs Intravenous Contrast Given 05/12/19 1244)    ED Course  I have reviewed the triage vital signs and the nursing notes.  Pertinent labs & imaging results that were available during my care of the patient were reviewed by me and considered in my medical decision making (see chart for details).  Clinical Course as of May 11 1445  Fri May 12, 2019  1141 Chest x-ray shows possible atelectasis versus infiltrate   [JK]  1141 Tachycardia noted but no fever or elevated wbc.  Not clearly infectious at this time   [JK]  1211 D dimer elevated.  Will ct to evaluate for PE   [  JK]  1313 CT chest shows small partially loculated effusions.  No pe   [JK]  1313 Trop and bnp normal   [JK]  1446 Repeat exam patient has faint wheeze noted.  Her oxygen saturation is normal without any supplemental oxygen   [JK]  1447 Patient's Doppler ultrasound was negative for DVT   [JK]    Clinical Course User  Index [JK] Dorie Rank, MD   MDM Rules/Calculators/A&P                      Patient presented with pleuritic chest pain associated with some left leg discomfort.  Patient has a history of COPD and I did note some slight crackles and wheezing on exam.  She did have a significantly elevated D-dimer and with her tachycardia was concerned about the possibility of PE and DVT.  She had a CT angiogram that does not show evidence of pulmonary embolism.  She does have small pleural effusions.  There is some suggestion of loculation but her symptoms were not suggestive of an empyema.  These do not warrant thoracentesis.  Patient's Doppler study also does not show evidence of DVT.  She did also test negative for Covid.  Patient has responded to treatment.  I will give her prescription for antibiotics as well as steroids to treat for COPD exacerbation.  Recommend outpatient follow-up with her primary care doctor.  Warning signs precautions discussed Final Clinical Impression(s) / ED Diagnoses Final diagnoses:  COPD exacerbation ([place])  Pleurisy  Pleural effusion    Rx / DC Orders ED Discharge Orders         Ordered    predniSONE (DELTASONE) 50 MG tablet  Daily     05/12/19 1446    doxycycline (VIBRA-TABS) 100 MG tablet  2 times daily     05/12/19 1446           Dorie Rank, MD 05/12/19 1448

## 2019-05-12 NOTE — Progress Notes (Signed)
ANTICOAGULATION CONSULT NOTE - Initial Consult  Pharmacy Consult for Heparin Indication: pulmonary embolus  Allergies  Allergen Reactions  . Oxycodone Itching  . Penicillins Itching, Rash and Other (See Comments)    Did it involve swelling of the face/tongue/throat, SOB, or low BP? No Did it involve sudden or severe rash/hives, skin peeling, or any reaction on the inside of your mouth or nose? Yes Did you need to seek medical attention at a hospital or doctor's office? Yes When did it last happen? Long time ago If all above answers are "NO", may proceed with cephalosporin use.   . Sulfonamide Derivatives Rash    Patient Measurements: Height: 4\' 11"  (149.9 cm) Weight: 54 kg (119 lb) IBW/kg (Calculated) : 43.2 Heparin Dosing Weight: 54 kg  Vital Signs: Temp: 98.6 F (37 C) (04/30 1015) Temp Source: Oral (04/30 1015) BP: 106/65 (04/30 1200) Pulse Rate: 107 (04/30 1200)  Labs: Recent Labs    05/12/19 1048  HGB 12.8  HCT 40.4  PLT 329  CREATININE 0.98  TROPONINIHS 3    Estimated Creatinine Clearance: 40.6 mL/min (by C-G formula based on SCr of 0.98 mg/dL).   Medical History: Past Medical History:  Diagnosis Date  . Arthritis    osteoarthritis  . Bronchitis   . Complication of anesthesia    s/p bladder retention-"only time it happened"  . COPD (chronic obstructive pulmonary disease) ([place])   . Depression   . Generalized headaches   . Heart murmur    heard occ  . Hiatal hernia    small- no problems  . History of blood in urine    microscopic -no problems identified," occ." high protein in urine"  . History of goiter   . Hyperlipemia   . Hypertension   . Osteoporosis   . Sigmoid diverticulosis    pt unaware  . Subcutaneous mass    RIGHT FKANK AND LEFT LATERAL CHEST WALL  . Wears glasses   . Wears glasses   . Wears partial dentures    Upper and lower    Medications:  (Not in a hospital admission)  Scheduled:   Infusions:  . sodium chloride      PRN:   Assessment: 69 yo female presents with chest pain and shortness of breath.  Pharmacy consulted to dose IV heparin.  D-dimer 5.42, chest CTa pending.  Goal of Therapy:  Heparin level 0.3-0.7 units/ml Monitor platelets by anticoagulation protocol: Yes   Plan:   Heparin 3500 units IV bolus, then  Heparin 900 units/hr IV infusion  Check heparin level in 8hr  Daily heparin level and CBC  Peggyann Juba, PharmD, BCPS Pharmacy: [phone] 05/12/2019,12:21 PM

## 2019-05-12 NOTE — ED Triage Notes (Signed)
Pt arrives [facility] from home with complaints of right sided chest/rib pain. Pt seen by PCP earlier this week for the same and prescribed muscle relaxers. Pt reports these are not helping. Pt denies fevers or chills. Per EMS: pt had rapid respirations. Afebrile BP: 118/70 HR:120 RR: 30

## 2019-06-09 ENCOUNTER — Ambulatory Visit: Payer: PPO

## 2019-06-09 ENCOUNTER — Other Ambulatory Visit: Payer: Self-pay | Admitting: Family Medicine

## 2019-06-09 ENCOUNTER — Ambulatory Visit
Admission: RE | Admit: 2019-06-09 | Discharge: 2019-06-09 | Disposition: A | Discharge status: Home / Self Care | Payer: PPO | Source: Ambulatory Visit | Attending: Family Medicine | Admitting: Family Medicine

## 2019-06-09 ENCOUNTER — Other Ambulatory Visit: Payer: Self-pay

## 2019-06-09 ENCOUNTER — Other Ambulatory Visit: Payer: PPO

## 2019-06-09 DIAGNOSIS — G4733 Obstructive sleep apnea (adult) (pediatric): Secondary | ICD-10-CM | POA: Diagnosis not present

## 2019-06-09 DIAGNOSIS — J90 Pleural effusion, not elsewhere classified: Secondary | ICD-10-CM

## 2019-06-09 DIAGNOSIS — Z1231 Encounter for screening mammogram for malignant neoplasm of breast: Secondary | ICD-10-CM

## 2019-06-09 DIAGNOSIS — J449 Chronic obstructive pulmonary disease, unspecified: Secondary | ICD-10-CM | POA: Diagnosis not present

## 2019-06-22 ENCOUNTER — Encounter: Payer: Self-pay | Admitting: Internal Medicine

## 2019-06-22 ENCOUNTER — Other Ambulatory Visit: Payer: Self-pay

## 2019-06-22 ENCOUNTER — Ambulatory Visit: Payer: PPO | Admitting: Internal Medicine

## 2019-06-22 VITALS — BP 104/58 | HR 113 | Temp 98.4°F | Ht 59.0 in | Wt 113.6 lb

## 2019-06-22 DIAGNOSIS — Z7189 Other specified counseling: Secondary | ICD-10-CM | POA: Diagnosis not present

## 2019-06-22 DIAGNOSIS — J90 Pleural effusion, not elsewhere classified: Secondary | ICD-10-CM

## 2019-06-22 DIAGNOSIS — F17210 Nicotine dependence, cigarettes, uncomplicated: Secondary | ICD-10-CM | POA: Diagnosis not present

## 2019-06-22 DIAGNOSIS — R053 Chronic cough: Secondary | ICD-10-CM

## 2019-06-22 DIAGNOSIS — R0600 Dyspnea, unspecified: Secondary | ICD-10-CM

## 2019-06-22 DIAGNOSIS — Z8709 Personal history of other diseases of the respiratory system: Secondary | ICD-10-CM

## 2019-06-22 DIAGNOSIS — R0609 Other forms of dyspnea: Secondary | ICD-10-CM

## 2019-06-22 DIAGNOSIS — R05 Cough: Secondary | ICD-10-CM

## 2019-06-22 DIAGNOSIS — Z7185 Encounter for immunization safety counseling: Secondary | ICD-10-CM

## 2019-06-22 DIAGNOSIS — J439 Emphysema, unspecified: Secondary | ICD-10-CM

## 2019-06-22 LAB — SARS-COV-2 IGG: SARS-COV-2 IgG: 0.03

## 2019-06-22 LAB — SEDIMENTATION RATE: Sed Rate: 66 mm/hr — ABNORMAL HIGH (ref 0–30)

## 2019-06-22 MED ORDER — SPIRIVA RESPIMAT 2.5 MCG/ACT IN AERS: 2.0000 | INHALATION_SPRAY | Freq: Every day | RESPIRATORY_TRACT | 0 refills | Status: DC

## 2019-06-22 NOTE — Addendum Note (Signed)
Addended by: Suzzanne Cloud E on: 06/22/2019 02:55 PM   Modules accepted: Orders

## 2019-06-22 NOTE — Patient Instructions (Addendum)
ICD-10-CM   1. Pulmonary emphysema, unspecified emphysema type ([place])  J43.9   2. Smoking greater than 40 pack years  F17.210   3. Dyspnea on exertion  R06.00   4. Chronic cough  R05   5. History of pleurisy  Z87.09   6. Small pleural effusion  J90   7. Vaccine counseling  Z71.89      Do blood covid IgG antibody test 06/22/2019 Do blood ANA, ACE, ESR, RF, CCP, DS-DNA, ssa, ssb, scl-70 06/22/2019 Do echo cardiogram - next few days to few weeks Do HRCT supine and prone - end July 2021 as3 month followup Hold off on PFT till we ensure effusions resolved/stabilized Respect your decision not to take covid vaccine due to fear of side effects Start spiriva respimat daily Continue albuterol as needed  Followup - end July 2021/early aguust 2021 after ct chest but we will call with blood and echo results before that

## 2019-06-22 NOTE — Progress Notes (Signed)
OV 06/22/2019  Subjective:  Patient ID: Kimberly [name], female , DOB: 1950/05/29 , age 69 y.o. , MRN: [id number] , ADDRESS: [place] [id number]   06/22/2019 -   Chief Complaint  Patient presents with  . Consult    Pt being referred by Dr. Brigitte Pulse for evaluation of COPD. Pt has an occ cough with clear phlegm, SOB with activities, and also has occ chest discomfort.     HPI Kimberly [name] 69 y.o. -presents with her husband.  She is an active smoker.  She has not taken the Covid vaccine because of fear of side effects.  She and her husband believe that in late 2019 she had really significant respiratory infection that she believes she might of had Covid at that time.  Nevertheless approximately 4 - 5 years ago primary care physician given a diagnosis of COPD.  She has been on albuterol as needed as needed.  However she says that she never had symptoms back then.  Was diagnosed with made on physical exam according to her history.  Then starting approximately over a year ago she developed insidious onset of chronic cough that has persisted.  She is on lisinopril for several years but she does not believe is the cause of the cough.  The cough itself is mild.  She does bring up some phlegm in her chest.  She also has some chest tightness.  She has relative dyspnea for climbing flights of stairs but the dyspnea itself is mild.  There is also fluctuations day-to-day on the symptoms.  There are days when she feels good there are days she feels bad.  The average COPD CAT score symptomatology burden is listed below.  Then approximately May 12, 2018 when she developed bilateral pleuritic chest pain in the infra axillary area.  This resulted in the ER visit.  I reviewed the ER records.  She had CT scan of the chest that shows small bilateral effusions and biapical emphysema.  Otherwise no evidence of ILD or cancer.  There is BNP and troponin that were normal.  We do not have echo report.   I personally visualized the CT images.  Since then the pleurisy has resolved but she is worried about the pleural effusion.  She also has persistent shortness of breath and cough as described below.  She is interested in getting to the bottom of her symptoms  Regarding Covid vaccination: She is worried about the side effects of the vaccine.  Therefore she and her husband will not take the vaccine.   CAT Score 06/22/2019  Total CAT Score 13     CAT COPD Symptom & Quality of Life Score (GSK trademark) 0 is no burden. 5 is highest burden 06/22/2019   Never Cough -> Cough all the time 2  No phlegm in chest -> Chest is full of phlegm 2  No chest tightness -> Chest feels very tight 2  No dyspnea for 1 flight stairs/hill -> Very dyspneic for 1 flight of stairs 1  No limitations for ADL at home -> Very limited with ADL at home 0  Confident leaving home -> Not at all confident leaving home 0  Sleep soundly -> Do not sleep soundly because of lung condition 3  Lots of Energy -> No energy at all 3  TOTAL Score (max 40)  13      ROS - per HPI  IMPRESSION: CT chest 1. Small partially loculated pleural effusions  at each lung base. Associated mild bibasilar atelectasis. No evidence of pneumonia or pulmonary edema. 2. No pulmonary embolism seen.  Aortic Atherosclerosis (ICD10-I70.0).   Electronically Signed   By: Franki Cabot M.D.   On: 05/12/2019 13:08   Results for Kimberly [name], Kimberly [name] (MRN [id number]) as of 06/22/2019 14:18  Ref. Range 07/26/2018 11:12 05/12/2019 10:48 05/12/2019 13:17  Creatinine Latest Ref Range: 0.44 - 1.00 mg/dL 1.20 (H) 0.98    Results for Kimberly [name], Kimberly [name] (MRN [id number]) as of 06/22/2019 14:18  Ref. Range 07/26/2018 11:12 05/12/2019 10:48  Hemoglobin Latest Ref Range: 12.0 - 15.0 g/dL 15.0 12.8  Results for Kimberly [name], Kimberly [name] (MRN [id number]) as of 06/22/2019 14:18  Ref. Range 05/12/2019 10:48  B Natriuretic Peptide Latest Ref Range: 0.0 - 100.0 pg/mL 24.7    has a past  medical history of Arthritis, Bronchitis, Complication of anesthesia, COPD (chronic obstructive pulmonary disease) ([facility]), Depression, Generalized headaches, Heart murmur, Hiatal hernia, History of blood in urine, History of goiter, Hyperlipemia, Hypertension, Osteoporosis, Sigmoid diverticulosis, Subcutaneous mass, Wears glasses, Wears glasses, and Wears partial dentures.   reports that she has been smoking cigarettes. She has a 45.00 pack-year smoking history. She has never used smokeless tobacco.  Past Surgical History:  Procedure Laterality Date  . BREAST EXCISIONAL BIOPSY Right pt unsure   fibrocystic tissue  . BREAST EXCISIONAL BIOPSY Left pt unsure   scarring  . BREAST SURGERY  [id number]   x3 fibrocystic disease, radial scarring  . BUNIONECTOMY Right   . COLONOSCOPY  2011  . ESOPHAGOGASTRODUODENOSCOPY  2008  . HAMMER TOE SURGERY Right    4,5  . HAND SURGERY Left    x 2 digits"repair fron cuts of tendons" "caught in door"  . KNEE ARTHROSCOPY Left    torn cartilage  . LESION REMOVAL N/A 09/28/2013   Procedure: REMOVAL OF POSTERIOR NECK MASS;  Surgeon: Michael Boston, MD;  Location: [facility];  Service: General;  Laterality: N/A;  . LIPOMA EXCISION  [phone]   x4 -1 abd, 1 rt.. arm, 1 bil. thigh  . MASS EXCISION N/A 07/26/2018   Procedure: REMOVAL OF RIGHT FLANK , LEFT LATERAL CHEST WALL AND RIGHT THIGH SUBCUTANEOUS MASSES;  Surgeon: Michael Boston, MD;  Location: [facility];  Service: General;  Laterality: N/A;  . PARTIAL HYSTERECTOMY  1984   abnormal bleeding  . PARTIAL KNEE ARTHROPLASTY Left   . THYROID LOBECTOMY Left 1970's    Allergies  Allergen Reactions  . Oxycodone Itching  . Penicillins Itching, Rash and Other (See Comments)    Did it involve swelling of the face/tongue/throat, SOB, or low BP? No Did it involve sudden or severe rash/hives, skin peeling, or any reaction on the inside of your mouth or nose? Yes Did you need to seek medical attention at a  hospital or doctor's office? Yes When did it last happen? Long time ago If all above answers are "NO", may proceed with cephalosporin use.   . Sulfonamide Derivatives Rash    Immunization History  Administered Date(s) Administered  . Influenza, High Dose Seasonal PF 09/13/2018  . Influenza,inj,quad, With Preservative 09/26/2016    Family History  Problem Relation Age of Onset  . Heart disease Father   . Heart disease Sister        cardiac arrest  . Cancer Brother        lymphoma     Current Outpatient Medications:  .  acetaminophen (TYLENOL) 500 MG tablet, Take 1,000 mg by mouth every 6 (six) hours  as needed for moderate pain or headache. , Disp: , Rfl:  .  albuterol (VENTOLIN HFA) 108 (90 Base) MCG/ACT inhaler, Inhale 2 puffs into the lungs every 4 (four) hours as needed for wheezing or shortness of breath., Disp: , Rfl:  .  Calcium Carb-Cholecalciferol (CALCIUM 600+D3 PO), Take 1 tablet by mouth daily., Disp: , Rfl:  .  Cholecalciferol (VITAMIN D) 1000 UNITS capsule, Take 1,000 Units by mouth every morning. , Disp: , Rfl:  .  Coenzyme Q10 (COQ10) 100 MG CAPS, Take 100 mg by mouth daily., Disp: , Rfl:  .  denosumab (PROLIA) 60 MG/ML SOLN injection, Inject 60 mg into the skin every 6 (six) months. Administer in upper arm, thigh, or abdomen, Disp: , Rfl:  .  diclofenac (VOLTAREN) 50 MG EC tablet, Take 50 mg by mouth 2 (two) times daily., Disp: , Rfl:  .  lisinopril (PRINIVIL,ZESTRIL) 20 MG tablet, Take 20 mg by mouth every morning. , Disp: , Rfl:  .  Magnesium 250 MG TABS, Take 250 mg by mouth at bedtime., Disp: , Rfl:  .  naproxen sodium (ALEVE) 220 MG tablet, Take 220 mg by mouth as needed., Disp: , Rfl:  .  OLANZapine (ZYPREXA) 2.5 MG tablet, Take 5 mg by mouth at bedtime., Disp: , Rfl:  .  venlafaxine XR (EFFEXOR-XR) 75 MG 24 hr capsule, Take 75 mg by mouth daily., Disp: , Rfl:  .  LIVALO 2 MG TABS, Take 1 tablet by mouth daily. (Patient not taking: Reported on 06/22/2019),  Disp: , Rfl:  .  Tiotropium Bromide Monohydrate (SPIRIVA RESPIMAT) 2.5 MCG/ACT AERS, Inhale 2 puffs into the lungs daily., Disp: 4 g, Rfl: 0      Objective:   Vitals:   06/22/19 1359  BP: (!) 104/58  Pulse: (!) 113  Temp: 98.4 F (36.9 C)  TempSrc: Oral  SpO2: 95%  Weight: 113 lb 9.6 oz (51.5 kg)  Height: '4\' 11"'  (1.499 m)    Estimated body mass index is 22.94 kg/m as calculated from the following:   Height as of this encounter: '4\' 11"'  (1.499 m).   Weight as of this encounter: 113 lb 9.6 oz (51.5 kg).  '@WEIGHTCHANGE' @  [employer]   06/22/19 1359  Weight: 113 lb 9.6 oz (51.5 kg)     Physical Exam  General Appearance:    Alert, cooperative, no distress, appears stated age - yes , Deconditioned looking - no , OBESE  - no, Sitting on Wheelchair -  no  Head:    Normocephalic, without obvious abnormality, atraumatic  Eyes:    PERRL, conjunctiva/corneas clear,  Ears:    Normal TM's and external ear canals, both ears  Nose:   Nares normal, septum midline, mucosa normal, no drainage    or sinus tenderness. OXYGEN ON  - no . Patient is @ ra   Throat:   Lips, mucosa, and tongue normal; teeth and gums normal. Cyanosis on lips - no SCART OF THYROIDECTOMY +  Neck:   Supple, symmetrical, trachea midline, no adenopathy;    thyroid:  no enlargement/tenderness/nodules; no carotid   bruit or JVD  Back:     Symmetric, no curvature, ROM normal, no CVA tenderness  Lungs:     Distress - no , Wheeze no, Barrell Chest - no, Purse lip breathing - no, Crackles - no   Chest Wall:    No tenderness or deformity.    Heart:    Regular rate and rhythm, S1 and S2 normal, no rub  or gallop, Murmur - no  Breast Exam:    NOT DONE  Abdomen:     Soft, non-tender, bowel sounds active all four quadrants,    no masses, no organomegaly. Visceral obesity - no  Genitalia:   NOT DONE  Rectal:   NOT DONE  Extremities:   Extremities - normal, Has Cane - no, Clubbing - no, Edema - no  Pulses:   2+ and  symmetric all extremities  Skin:   Stigmata of Connective Tissue Disease - no  Lymph nodes:   Cervical, supraclavicular, and axillary nodes normal  Psychiatric:  Neurologic:   Pleasant - yes, Anxious - no, Flat affect - no  CAm-ICU - neg, Alert and Oriented x 3 - yes, Moves all 4s - yes, Speech - normal, Cognition - intact           Assessment:       ICD-10-CM   1. Pulmonary emphysema, unspecified emphysema type ([facility])  J43.9 SARS-COV-2 IgG    ANA    Angiotensin converting enzyme    Sed Rate (ESR)    Rheumatoid Factor    Cyclic citrul peptide antibody, IgG    Anti-DNA antibody, double-stranded    Sjogren's syndrome antibods(ssa + ssb)    Anti-scleroderma antibody    ECHOCARDIOGRAM COMPLETE    CT Chest High Resolution  2. Smoking greater than 40 pack years  F17.210   3. Dyspnea on exertion  R06.00   4. Chronic cough  R05   5. History of pleurisy  Z87.09   6. Small pleural effusion  J90   7. Vaccine counseling  Z71.89        Plan:     Patient Instructions     ICD-10-CM   1. Pulmonary emphysema, unspecified emphysema type ([place])  J43.9   2. Smoking greater than 40 pack years  F17.210   3. Dyspnea on exertion  R06.00   4. Chronic cough  R05   5. History of pleurisy  Z87.09   6. Small pleural effusion  J90   7. Vaccine counseling  Z71.89      Do blood covid IgG antibody test 06/22/2019 Do blood ANA, ACE, ESR, RF, CCP, DS-DNA, ssa, ssb, scl-70 06/22/2019 Do echo cardiogram - next few days to few weeks Do HRCT supine and prone - end July 2021 as3 month followup Hold off on PFT till we ensure effusions resolved/stabilized Respect your decision not to take covid vaccine due to fear of side effects Start spiriva respimat daily Continue albuterol as needed  Followup - end July 2021/early aguust 2021 after ct chest but we will call with blood and echo results before that     SIGNATURE    Dr. Brand Males, M.D., F.C.C.P,  Pulmonary and Critical Care  Medicine Staff Physician, [place] Director - Interstitial Lung Disease  Program  Pulmonary [place] [facility] [place], [place], [address]  Pager: [phone], If no answer or between  15:00h - 7:00h: call [phone] Telephone: [phone]  2:48 PM 06/22/2019

## 2019-06-22 NOTE — Progress Notes (Signed)
Patient seen in the office today and instructed on use of spiriva respimat 2.5.  Patient expressed understanding and demonstrated technique.

## 2019-06-25 LAB — ANGIOTENSIN CONVERTING ENZYME: Angiotensin-Converting Enzyme: <5 U/L — ABNORMAL LOW (ref 9–67)

## 2019-06-25 LAB — SJOGREN'S SYNDROME ANTIBODS(SSA + SSB)
SSA (Ro) (ENA) Antibody, IgG: <1 AI
SSB (La) (ENA) Antibody, IgG: <1 AI

## 2019-06-25 LAB — RHEUMATOID FACTOR: Rheumatoid fact SerPl-aCnc: <14 IU/mL (ref <14)

## 2019-06-25 LAB — ANTI-SCLERODERMA ANTIBODY: Scleroderma (Scl-70) (ENA) Antibody, IgG: <1 AI

## 2019-06-25 LAB — ANTI-NUCLEAR AB-TITER (ANA TITER): ANA Titer 1: 1:40 {titer} — ABNORMAL HIGH

## 2019-06-25 LAB — CYCLIC CITRUL PEPTIDE ANTIBODY, IGG: Cyclic Citrullin Peptide Ab: <16 UNITS

## 2019-06-25 LAB — ANA: Anti Nuclear Antibody (ANA): POSITIVE — AB

## 2019-06-25 LAB — ANTI-DNA ANTIBODY, DOUBLE-STRANDED: ds DNA Ab: <1 IU/mL

## 2019-07-10 DIAGNOSIS — G4733 Obstructive sleep apnea (adult) (pediatric): Secondary | ICD-10-CM | POA: Diagnosis not present

## 2019-07-18 ENCOUNTER — Ambulatory Visit (HOSPITAL_COMMUNITY): Payer: PPO | Attending: Internal Medicine

## 2019-07-18 ENCOUNTER — Encounter: Payer: Self-pay | Admitting: Gastroenterology

## 2019-07-18 ENCOUNTER — Other Ambulatory Visit: Payer: Self-pay

## 2019-07-18 DIAGNOSIS — I10 Essential (primary) hypertension: Secondary | ICD-10-CM

## 2019-07-18 DIAGNOSIS — J439 Emphysema, unspecified: Secondary | ICD-10-CM | POA: Diagnosis not present

## 2019-07-18 MED ORDER — PERFLUTREN LIPID MICROSPHERE
1.0000 mL | INTRAVENOUS | Status: AC | PRN
  Administered 2019-07-18: 1 mL via INTRAVENOUS

## 2019-07-24 ENCOUNTER — Telehealth: Payer: Self-pay | Admitting: Internal Medicine

## 2019-07-24 MED ORDER — SPIRIVA RESPIMAT 2.5 MCG/ACT IN AERS: 2.0000 | INHALATION_SPRAY | Freq: Every day | RESPIRATORY_TRACT | 5 refills | Status: DC

## 2019-07-24 NOTE — Telephone Encounter (Signed)
Spiriva refilled to [facility] pharmacy requested by patient.  Attempted to call the patient to let her know.  No VM to leave a message.

## 2019-08-07 DIAGNOSIS — H16223 Keratoconjunctivitis sicca, not specified as Sjogren's, bilateral: Secondary | ICD-10-CM | POA: Diagnosis not present

## 2019-08-07 DIAGNOSIS — H26493 Other secondary cataract, bilateral: Secondary | ICD-10-CM | POA: Diagnosis not present

## 2019-08-09 ENCOUNTER — Ambulatory Visit: Payer: PPO | Admitting: Internal Medicine

## 2019-08-09 DIAGNOSIS — G4733 Obstructive sleep apnea (adult) (pediatric): Secondary | ICD-10-CM | POA: Diagnosis not present

## 2019-08-11 ENCOUNTER — Ambulatory Visit (HOSPITAL_COMMUNITY)
Admission: RE | Admit: 2019-08-11 | Discharge: 2019-08-11 | Disposition: A | Discharge status: Home / Self Care | Payer: PPO | Source: Ambulatory Visit | Attending: Internal Medicine | Admitting: Internal Medicine

## 2019-08-11 ENCOUNTER — Other Ambulatory Visit: Payer: Self-pay

## 2019-08-11 DIAGNOSIS — R918 Other nonspecific abnormal finding of lung field: Secondary | ICD-10-CM | POA: Diagnosis not present

## 2019-08-11 DIAGNOSIS — J439 Emphysema, unspecified: Secondary | ICD-10-CM | POA: Diagnosis not present

## 2019-08-11 DIAGNOSIS — J9809 Other diseases of bronchus, not elsewhere classified: Secondary | ICD-10-CM | POA: Diagnosis not present

## 2019-08-15 ENCOUNTER — Other Ambulatory Visit: Payer: Self-pay

## 2019-08-15 ENCOUNTER — Ambulatory Visit: Payer: PPO | Admitting: Adult Health

## 2019-08-15 ENCOUNTER — Encounter: Payer: Self-pay | Admitting: Adult Health

## 2019-08-15 VITALS — BP 126/70 | HR 102 | Temp 98.6°F | Ht <= 58 in | Wt 114.8 lb

## 2019-08-15 DIAGNOSIS — R911 Solitary pulmonary nodule: Secondary | ICD-10-CM | POA: Diagnosis not present

## 2019-08-15 DIAGNOSIS — R091 Pleurisy: Secondary | ICD-10-CM

## 2019-08-15 DIAGNOSIS — J449 Chronic obstructive pulmonary disease, unspecified: Secondary | ICD-10-CM | POA: Diagnosis not present

## 2019-08-15 DIAGNOSIS — Z720 Tobacco use: Secondary | ICD-10-CM

## 2019-08-15 DIAGNOSIS — J219 Acute bronchiolitis, unspecified: Secondary | ICD-10-CM | POA: Diagnosis not present

## 2019-08-15 MED ORDER — SPIRIVA RESPIMAT 2.5 MCG/ACT IN AERS: 2.0000 | INHALATION_SPRAY | Freq: Every day | RESPIRATORY_TRACT | 5 refills | Status: DC

## 2019-08-15 NOTE — Assessment & Plan Note (Signed)
Bronchiolitis noted on CT scan.  Patient is a active heavy smoker.  Patient is encouraged on smoking cessation.

## 2019-08-15 NOTE — Progress Notes (Signed)
'@Patient'  ID: Kimberly [name], female    DOB: [birth year], 69 y.o.   MRN: [id number]  Chief Complaint  Patient presents with  . Follow-up    COPD     Referring provider: Mayra Neer, MD  HPI: 69 year old female active smoker seen for pulmonary consult June 22, 2019 for COPD and Dyspnea  Medical history significant for depression, hyperlipidemia, hypertension  TEST/EVENTS :   04/2019 CT chest Small partially loculated pleural effusions at each lung base. Associated mild bibasilar atelectasis. No evidence of pneumonia or pulmonary edema. 2. No pulmonary embolism seen.  06/2019 CAT score 13  High-resolution CT chest August 11, 2019 - for interstitial lung disease, mild diffuse bilateral bronchial wall thickening with innumerable tiny centrilobular nodules most numerous and the lung apices consistent with a smoking-related bronchitis/respiratory bronchiolitis, 3 mm right upper lobe nodule.  Atherosclerosis noted, previous pleural effusions resolved  2D echo July 18, 2019 EF 65 to 12%, grade 1 diastolic dysfunction, negative for apical thrombus.  Normal pulmonary artery systolic pressure.  Autoimmune/connective tissue labs negative except for sed rate elevated at 66 and ANA positive , 1: 40, cytoplasmic, CCP negative  SARS-CoV-2 IgG negative  08/15/2019 Follow up : COPD  Patient returns for a 98-monthfollow-up.  Patient was seen last visit for a pulmonary consult for COPD and shortness of breath.  Patient was diagnosed by her primary care provider with COPD around 2016.  She is an active smoker smokes on average 1/2 to 1 pack of cigarettes daily.  She has a 45-pack-year history. Patient developed some pleuritic symptoms in April.  She went to the emergency room CT chest was negative for PE.  But showed small bilateral effusions.  BNP and troponin were normal.  A 2D echo showed preserved EF normal pulmonary artery pressures and grade 1 diastolic dysfunction. Last visit patient was set up for  high-resolution CT chest that was done on August 11, 2019 that was negative for interstitial lung disease.  Showed bilateral bronchial wall thickening and innumerable pulmonary nodules in the lung apices.  Felt consistent with smoking-related bronchitis/bronchiolitis.  There was a right upper lobe 3 mm nodule.  And pleural effusions had resolved.  Patient was set up for autoimmune and connective tissue labs that were negative except for a mildly elevated ANA 1: 40 and cytoplasmic.  CCP was negative.  ESR was 66.  Patient had been concerned that she might have been exposed to COVID-19 in the past.  SARS-CoV-2 IgG was negative. She was started on Spiriva daily.  Since last visit patient says she is doing some better.  Her pleuritic pain has totally resolved.  Patient says she is mildly active is able to do her light housework.  She does not exercise on a routine basis.  She has no shortness of breath at rest.  And no shortness of breath with light activities.  Does get winded with heavy activities.  She denies any cough.  Of note patient is on ACE inhibitor.  She is unsure if Spiriva has helped but feels that her breathing is slightly better.   Declines Covid vaccine. Allergies  Allergen Reactions  . Oxycodone Itching  . Penicillins Itching, Rash and Other (See Comments)    Did it involve swelling of the face/tongue/throat, SOB, or low BP? No Did it involve sudden or severe rash/hives, skin peeling, or any reaction on the inside of your mouth or nose? Yes Did you need to seek medical attention at a hospital or doctor's office? Yes  When did it last happen? Long time ago If all above answers are "NO", may proceed with cephalosporin use.   . Sulfonamide Derivatives Rash    Immunization History  Administered Date(s) Administered  . Influenza, High Dose Seasonal PF 09/13/2018  . Influenza,inj,quad, With Preservative 09/26/2016    Past Medical History:  Diagnosis Date  . Arthritis    osteoarthritis    . Bronchitis   . Complication of anesthesia    s/p bladder retention-"only time it happened"  . COPD (chronic obstructive pulmonary disease) ([place])   . Depression   . Generalized headaches   . Heart murmur    heard occ  . Hiatal hernia    small- no problems  . History of blood in urine    microscopic -no problems identified," occ." high protein in urine"  . History of goiter   . Hyperlipemia   . Hypertension   . Osteoporosis   . Sigmoid diverticulosis    pt unaware  . Subcutaneous mass    RIGHT FKANK AND LEFT LATERAL CHEST WALL  . Wears glasses   . Wears glasses   . Wears partial dentures    Upper and lower    Tobacco History: Social History   Tobacco Use  Smoking Status Current Every Day Smoker  . Packs/day: 1.00  . Years: 45.00  . Pack years: 45.00  . Types: Cigarettes  Smokeless Tobacco Never Used  Tobacco Comment   smoking between 0.5-1ppd   Ready to quit: Not Answered Counseling given: Not Answered Comment: smoking between 0.5-1ppd   Outpatient Medications Prior to Visit  Medication Sig Dispense Refill  . acetaminophen (TYLENOL) 500 MG tablet Take 1,000 mg by mouth every 6 (six) hours as needed for moderate pain or headache.     . albuterol (VENTOLIN HFA) 108 (90 Base) MCG/ACT inhaler Inhale 2 puffs into the lungs every 4 (four) hours as needed for wheezing or shortness of breath.    . Calcium Carb-Cholecalciferol (CALCIUM 600+D3 PO) Take 1 tablet by mouth daily.    . Cholecalciferol (VITAMIN D) 1000 UNITS capsule Take 1,000 Units by mouth every morning.     . Coenzyme Q10 (COQ10) 100 MG CAPS Take 100 mg by mouth daily.    Marland Kitchen denosumab (PROLIA) 60 MG/ML SOLN injection Inject 60 mg into the skin every 6 (six) months. Administer in upper arm, thigh, or abdomen    . diclofenac (VOLTAREN) 50 MG EC tablet Take 50 mg by mouth 2 (two) times daily.    Marland Kitchen lisinopril (PRINIVIL,ZESTRIL) 20 MG tablet Take 20 mg by mouth every morning.     Marland Kitchen LIVALO 2 MG TABS Take 1 tablet  by mouth daily.     . Magnesium 250 MG TABS Take 250 mg by mouth at bedtime.    . naproxen sodium (ALEVE) 220 MG tablet Take 220 mg by mouth as needed.    Marland Kitchen OLANZapine (ZYPREXA) 2.5 MG tablet Take 5 mg by mouth at bedtime.    Marland Kitchen venlafaxine XR (EFFEXOR-XR) 75 MG 24 hr capsule Take 75 mg by mouth daily.    . Tiotropium Bromide Monohydrate (SPIRIVA RESPIMAT) 2.5 MCG/ACT AERS Inhale 2 puffs into the lungs daily. 4 g 5   No facility-administered medications prior to visit.     Review of Systems:   Constitutional:   No  weight loss, night sweats,  Fevers, chills, + fatigue, or  lassitude.  HEENT:   No headaches,  Difficulty swallowing,  Tooth/dental problems, or  Sore throat,  No sneezing, itching, ear ache, nasal congestion, post nasal drip,   CV:  No chest pain,  Orthopnea, PND, swelling in lower extremities, anasarca, dizziness, palpitations, syncope.   GI  No heartburn, indigestion, abdominal pain, nausea, vomiting, diarrhea, change in bowel habits, loss of appetite, bloody stools.   Resp:   No excess mucus, no productive cough,  No non-productive cough,  No coughing up of blood.  No change in color of mucus.  No wheezing.  No chest wall deformity  Skin: no rash or lesions.  GU: no dysuria, change in color of urine, no urgency or frequency.  No flank pain, no hematuria   MS:  No joint pain or swelling.  No decreased range of motion.  No back pain.    Physical Exam  BP 126/70 (BP Location: Left Arm, Cuff Size: Normal)   Pulse (!) 102   Temp 98.6 F (37 C) (Oral)   Ht '4\' 10"'  (1.473 m)   Wt 114 lb 12.8 oz (52.1 kg)   SpO2 97%   BMI 23.99 kg/m   GEN: A/Ox3; pleasant , NAD, well nourished    HEENT:  [place]/AT,   NOSE-clear, THROAT-clear, no lesions, no postnasal drip or exudate noted.   NECK:  Supple w/ fair ROM; no JVD; normal carotid impulses w/o bruits; no thyromegaly or nodules palpated; no lymphadenopathy.    RESP  Clear  P & A; w/o, wheezes/ rales/ or  rhonchi. no accessory muscle use, no dullness to percussion  CARD:  RRR, no m/r/g, no peripheral edema, pulses intact, no cyanosis or clubbing.  GI:   Soft & nt; nml bowel sounds; no organomegaly or masses detected.   Musco: Warm bil, no deformities or joint swelling noted.   Neuro: alert, no focal deficits noted.    Skin: Warm, no lesions or rashes    Lab Results:  CBC    Component Value Date/Time   WBC 9.6 05/12/2019 1048   RBC 4.31 05/12/2019 1048   HGB 12.8 05/12/2019 1048   HCT 40.4 05/12/2019 1048   PLT 329 05/12/2019 1048   MCV 93.7 05/12/2019 1048   MCH 29.7 05/12/2019 1048   MCHC 31.7 05/12/2019 1048   RDW 14.8 05/12/2019 1048    BMET    Component Value Date/Time   NA 138 05/12/2019 1048   K 4.5 05/12/2019 1048   CL 102 05/12/2019 1048   CO2 28 05/12/2019 1048   GLUCOSE 128 (H) 05/12/2019 1048   BUN 19 05/12/2019 1048   CREATININE 0.98 05/12/2019 1048   CALCIUM 9.3 05/12/2019 1048   GFRNONAA 59 (L) 05/12/2019 1048   GFRAA >60 05/12/2019 1048    BNP    Component Value Date/Time   BNP 24.7 05/12/2019 1048    ProBNP No results found for: PROBNP  Imaging: CT Chest High Resolution  Result Date: 08/14/2019 CLINICAL DATA:  Dyspnea on exertion EXAM: CT CHEST WITHOUT CONTRAST TECHNIQUE: Multidetector CT imaging of the chest was performed following the standard protocol without intravenous contrast. High resolution imaging of the lungs, as well as inspiratory and expiratory imaging, was performed. COMPARISON:  05/12/2019 FINDINGS: Cardiovascular: Aortic atherosclerosis. Normal heart size. Left coronary artery calcifications. No pericardial effusion. Mediastinum/Nodes: No enlarged mediastinal, hilar, or axillary lymph nodes. Thyroid gland, trachea, and esophagus demonstrate no significant findings. Lungs/Pleura: Mild, diffuse bilateral bronchial wall thickening. Innumerable tiny centrilobular nodules, most numerous in the lung apices. There is a more discrete,  nonspecific 3 mm nodule of the posterior right upper lobe (series 4, image  64). No evidence of fibrotic interstitial lung disease. No significant air trapping on expiratory phase imaging. No pleural effusion or pneumothorax. Upper Abdomen: No acute abnormality. Musculoskeletal: No chest wall mass or suspicious bone lesions identified. IMPRESSION: 1. No evidence of fibrotic interstitial lung disease. 2. Mild, diffuse bilateral bronchial wall thickening with innumerable tiny centrilobular nodules, most numerous in the lung apices. Findings are consistent with smoking-related bronchitis and respiratory bronchiolitis. 3. There is a more discrete, nonspecific 3 mm nodule of the posterior right upper lobe, unchanged compared to prior dated 05/12/2019. No follow-up needed if patient is low-risk. Non-contrast chest CT can be considered in 12 months if patient is high-risk. This recommendation follows the consensus statement: Guidelines for Management of Incidental Pulmonary Nodules Detected on CT Images: From the Fleischner Society 2017; Radiology 2017; 284:228-243. 4. Previously noted pleural effusions are resolved. 5. Coronary artery disease.  Aortic Atherosclerosis (ICD10-I70.0). Electronically Signed   By: Eddie Candle M.D.   On: 08/14/2019 08:39   ECHOCARDIOGRAM COMPLETE  Result Date: 07/18/2019    ECHOCARDIOGRAM REPORT   Patient Name:   Kimberly [name] Date of Exam: 07/18/2019 Medical Rec #:  [id number]     Height:       59.0 in Accession #:    [id number]    Weight:       113.6 lb Date of Birth:  [birth year]     BSA:          1.450 m Patient Age:    70 years      BP:           104/58 mmHg Patient Gender: F             HR:           101 bpm. Exam Location:  [facility] Procedure: 2D Echo, Cardiac Doppler, Color Doppler and Intracardiac            Opacification Agent Indications:    J43.9 Emphysema  History:        Patient has no prior history of Echocardiogram examinations.                 Risk Factors:Hypertension,  Dyslipidemia and Current Smoker.  Sonographer:    Jessee Avers, RDCS Referring Phys: [phone] [facility]  Sonographer Comments: Technically difficult study due to poor echo windows and suboptimal apical window. IMPRESSIONS  1. No apical thrombus with Definity contrast.. Left ventricular ejection fraction, by estimation, is 65 to 70%. The left ventricle has normal function. The left ventricle has no regional wall motion abnormalities. Left ventricular diastolic parameters are consistent with Grade I diastolic dysfunction (impaired relaxation).  2. Right ventricular systolic function is normal. The right ventricular size is normal. There is normal pulmonary artery systolic pressure. The estimated right ventricular systolic pressure is 69.6 mmHg.  3. The mitral valve is grossly normal. Trivial mitral valve regurgitation.  4. The aortic valve is tricuspid. Aortic valve regurgitation is not visualized. FINDINGS  Left Ventricle: No apical thrombus with Definity contrast. Left ventricular ejection fraction, by estimation, is 65 to 70%. The left ventricle has normal function. The left ventricle has no regional wall motion abnormalities. Definity contrast agent was  given IV to delineate the left ventricular endocardial borders. The left ventricular internal cavity size was normal in size. There is no left ventricular hypertrophy. Left ventricular diastolic parameters are consistent with Grade I diastolic dysfunction (impaired relaxation). Indeterminate filling pressures. Right Ventricle: The right ventricular size is normal. No increase in  right ventricular wall thickness. Right ventricular systolic function is normal. There is normal pulmonary artery systolic pressure. The tricuspid regurgitant velocity is 2.14 m/s, and  with an assumed right atrial pressure of 3 mmHg, the estimated right ventricular systolic pressure is 53.9 mmHg. Left Atrium: Left atrial size was normal in size. Right Atrium: Right atrial size was  normal in size. Pericardium: There is no evidence of pericardial effusion. Mitral Valve: The mitral valve is grossly normal. Trivial mitral valve regurgitation. Tricuspid Valve: The tricuspid valve is grossly normal. Tricuspid valve regurgitation is mild. Aortic Valve: The aortic valve is tricuspid. Aortic valve regurgitation is not visualized. Pulmonic Valve: The pulmonic valve was grossly normal. Pulmonic valve regurgitation is trivial. Aorta: The aortic root and ascending aorta are structurally normal, with no evidence of dilitation. IAS/Shunts: No atrial level shunt detected by color flow Doppler.  LEFT VENTRICLE PLAX 2D LVIDd:         3.30 cm  Diastology LVIDs:         2.20 cm  LV e' lateral:   4.80 cm/s LV PW:         0.80 cm  LV E/e' lateral: 12.3 LV IVS:        0.90 cm  LV e' medial:    6.98 cm/s LVOT diam:     1.70 cm  LV E/e' medial:  8.5 LV SV:         60 LV SV Index:   41 LVOT Area:     2.27 cm  RIGHT VENTRICLE RV Basal diam:  2.50 cm RV S prime:     12.60 cm/s TAPSE (M-mode): 1.7 cm RVSP:           21.3 mmHg LEFT ATRIUM             Index      RIGHT ATRIUM           Index LA diam:        2.50 cm 1.72 cm/m RA Pressure: 3.00 mmHg LA Vol (A2C):   13.8 ml 9.52 ml/m RA Area:     8.84 cm LA Vol (A4C):   12.1 ml 8.34 ml/m RA Volume:   17.90 ml  12.34 ml/m LA Biplane Vol: 13.9 ml 9.58 ml/m  AORTIC VALVE LVOT Vmax:   128.00 cm/s LVOT Vmean:  92.600 cm/s LVOT VTI:    0.264 m  AORTA Ao Root diam: 2.90 cm Ao Asc diam:  2.90 cm MITRAL VALVE                TRICUSPID VALVE                             TR Peak grad:   18.3 mmHg                             TR Vmax:        214.00 cm/s MV E velocity: 59.10 cm/s   Estimated RAP:  3.00 mmHg MV A velocity: 102.00 cm/s  RVSP:           21.3 mmHg MV E/A ratio:  0.58                             SHUNTS  Systemic VTI:  0.26 m                             Systemic Diam: 1.70 cm Lyman Bishop MD Electronically signed by Lyman Bishop MD Signature  Date/Time: 07/18/2019/12:07:51 PM    Final     perflutren lipid microspheres (DEFINITY) IV suspension    Date Action Dose Route User   07/18/2019 1125 Given  Intravenous (Left Arm) Basilia Jumbo H, RDCS      No flowsheet data found.  No results found for: NITRICOXIDE      Assessment & Plan:   COPD (chronic obstructive pulmonary disease) ([facility]) Suspected COPD and a heavy smoker. CT chest shows no evidence of interstitial lung disease however smokers related bronchiolitis Patient is encouraged on smoking cessation. Needs PFTs for COPD staging Continue with Spiriva.  Activity as tolerated. Refer to the low-dose CT screening program  Plan  Patient Instructions  Continue on Spiriva 2 puffs daily , rinse after use.  Ventolin inhaler As needed  Wheezing /shortness of breath.  Activity as tolerated.  Work on not smoking.  Refer to Lung cancer screen program- Yearly low dose CT . -Anselm Lis NP .  Follow up with Dr. Chase Caller in 3 months with PFT and As needed          Pleurisy Pleurisy has resolved.  Previous small bilateral pleural effusions have resolved on most recent CT scans.  2D echo showed preserved EF and grade 1 diastolic dysfunction.  Patient does not appear to be in volume overload.  Plan  Patient Instructions  Continue on Spiriva 2 puffs daily , rinse after use.  Ventolin inhaler As needed  Wheezing /shortness of breath.  Activity as tolerated.  Work on not smoking.  Refer to Lung cancer screen program- Yearly low dose CT . -Anselm Lis NP .  Follow up with Dr. Chase Caller in 3 months with PFT and As needed          Bronchiolitis Bronchiolitis noted on CT scan.  Patient is a active heavy smoker.  Patient is encouraged on smoking cessation.  Pulmonary nodule 3 mm right upper lobe pulmonary nodule noted.  Patient is an active smoker.  Patient has been referred to the low-dose CT screening program.  Will need follow-up CT in 1 year  Tobacco abuse Smoking  cessation discussed     Rexene Edison, NP 08/15/2019

## 2019-08-15 NOTE — Assessment & Plan Note (Signed)
3 mm right upper lobe pulmonary nodule noted.  Patient is an active smoker.  Patient has been referred to the low-dose CT screening program.  Will need follow-up CT in 1 year

## 2019-08-15 NOTE — Assessment & Plan Note (Signed)
Suspected COPD and a heavy smoker. CT chest shows no evidence of interstitial lung disease however smokers related bronchiolitis Patient is encouraged on smoking cessation. Needs PFTs for COPD staging Continue with Spiriva.  Activity as tolerated. Refer to the low-dose CT screening program  Plan  Patient Instructions  Continue on Spiriva 2 puffs daily , rinse after use.  Ventolin inhaler As needed  Wheezing /shortness of breath.  Activity as tolerated.  Work on not smoking.  Refer to Lung cancer screen program- Yearly low dose CT . -Anselm Lis NP .  Follow up with Dr. Chase Caller in 3 months with PFT and As needed

## 2019-08-15 NOTE — Assessment & Plan Note (Signed)
Smoking cessation discussed 

## 2019-08-15 NOTE — Assessment & Plan Note (Signed)
Pleurisy has resolved.  Previous small bilateral pleural effusions have resolved on most recent CT scans.  2D echo showed preserved EF and grade 1 diastolic dysfunction.  Patient does not appear to be in volume overload.  Plan  Patient Instructions  Continue on Spiriva 2 puffs daily , rinse after use.  Ventolin inhaler As needed  Wheezing /shortness of breath.  Activity as tolerated.  Work on not smoking.  Refer to Lung cancer screen program- Yearly low dose CT . -Anselm Lis NP .  Follow up with Dr. Chase Caller in 3 months with PFT and As needed

## 2019-08-15 NOTE — Patient Instructions (Addendum)
Continue on Spiriva 2 puffs daily , rinse after use.  Ventolin inhaler As needed  Wheezing /shortness of breath.  Activity as tolerated.  Work on not smoking.  Refer to Lung cancer screen program- Yearly low dose CT . -Anselm Lis NP .  Follow up with Dr. Chase Caller in 3 months with PFT and As needed

## 2019-08-16 ENCOUNTER — Telehealth: Payer: Self-pay | Admitting: Adult Health

## 2019-08-16 MED ORDER — SPIRIVA RESPIMAT 2.5 MCG/ACT IN AERS: 2.0000 | INHALATION_SPRAY | Freq: Every day | RESPIRATORY_TRACT | 5 refills | Status: DC

## 2019-08-16 NOTE — Telephone Encounter (Signed)
Called patient, order for Spiriva sent to preferred pharmacy. Previous order sent as a sample and did not arrive at pharmacy. Nothing further needed.

## 2019-08-25 DIAGNOSIS — J069 Acute upper respiratory infection, unspecified: Secondary | ICD-10-CM | POA: Diagnosis not present

## 2019-08-28 DIAGNOSIS — J22 Unspecified acute lower respiratory infection: Secondary | ICD-10-CM | POA: Diagnosis not present

## 2019-08-28 DIAGNOSIS — J209 Acute bronchitis, unspecified: Secondary | ICD-10-CM | POA: Diagnosis not present

## 2019-09-09 ENCOUNTER — Other Ambulatory Visit (HOSPITAL_COMMUNITY)
Admission: RE | Admit: 2019-09-09 | Discharge: 2019-09-09 | Disposition: A | Discharge status: Home / Self Care | Payer: PPO | Source: Ambulatory Visit | Attending: Adult Health | Admitting: Adult Health

## 2019-09-09 DIAGNOSIS — Z20822 Contact with and (suspected) exposure to covid-19: Secondary | ICD-10-CM | POA: Insufficient documentation

## 2019-09-09 DIAGNOSIS — Z01812 Encounter for preprocedural laboratory examination: Secondary | ICD-10-CM | POA: Insufficient documentation

## 2019-09-09 DIAGNOSIS — G4733 Obstructive sleep apnea (adult) (pediatric): Secondary | ICD-10-CM | POA: Diagnosis not present

## 2019-09-09 LAB — SARS CORONAVIRUS 2 (TAT 6-24 HRS): SARS Coronavirus 2: NEGATIVE

## 2019-09-13 ENCOUNTER — Ambulatory Visit (INDEPENDENT_AMBULATORY_CARE_PROVIDER_SITE_OTHER): Payer: PPO | Admitting: Internal Medicine

## 2019-09-13 ENCOUNTER — Other Ambulatory Visit: Payer: Self-pay

## 2019-09-13 DIAGNOSIS — J449 Chronic obstructive pulmonary disease, unspecified: Secondary | ICD-10-CM

## 2019-09-13 LAB — PULMONARY FUNCTION TEST
DL/VA % pred: 99 %
DL/VA: 4.27 ml/min/mmHg/L
DLCO cor % pred: 91 %
DLCO cor: 15.04 ml/min/mmHg
DLCO unc % pred: 95 %
DLCO unc: 15.57 ml/min/mmHg
FEF 25-75 Post: 1.59 L/sec
FEF 25-75 Pre: 0.64 L/sec
FEF2575-%Change-Post: 147 %
FEF2575-%Pred-Post: 95 %
FEF2575-%Pred-Pre: 38 %
FEV1-%Change-Post: 29 %
FEV1-%Pred-Post: 64 %
FEV1-%Pred-Pre: 49 %
FEV1-Post: 1.18 L
FEV1-Pre: 0.91 L
FEV1FVC-%Change-Post: -6 %
FEV1FVC-%Pred-Pre: 95 %
FEV6-%Change-Post: 39 %
FEV6-%Pred-Post: 75 %
FEV6-%Pred-Pre: 54 %
FEV6-Post: 1.75 L
FEV6-Pre: 1.26 L
FEV6FVC-%Pred-Post: 104 %
FEV6FVC-%Pred-Pre: 104 %
FVC-%Change-Post: 39 %
FVC-%Pred-Post: 71 %
FVC-%Pred-Pre: 51 %
FVC-Post: 1.75 L
FVC-Pre: 1.26 L
Post FEV1/FVC ratio: 67 %
Post FEV6/FVC ratio: 100 %
Pre FEV1/FVC ratio: 72 %
Pre FEV6/FVC Ratio: 100 %
RV % pred: 142 %
RV: 2.74 L
TLC % pred: 98 %
TLC: 4.23 L

## 2019-09-13 NOTE — Progress Notes (Signed)
Full PFT performed today. °

## 2019-09-26 NOTE — Progress Notes (Signed)
Called patient with results of recent PFT. Provider notes reviewed. Patient verbalized understanding of results, follow up appointment made as requested.

## 2019-09-27 DIAGNOSIS — I129 Hypertensive chronic kidney disease with stage 1 through stage 4 chronic kidney disease, or unspecified chronic kidney disease: Secondary | ICD-10-CM | POA: Diagnosis not present

## 2019-09-27 DIAGNOSIS — F322 Major depressive disorder, single episode, severe without psychotic features: Secondary | ICD-10-CM | POA: Diagnosis not present

## 2019-09-27 DIAGNOSIS — J449 Chronic obstructive pulmonary disease, unspecified: Secondary | ICD-10-CM | POA: Diagnosis not present

## 2019-09-27 DIAGNOSIS — E890 Postprocedural hypothyroidism: Secondary | ICD-10-CM | POA: Diagnosis not present

## 2019-09-27 DIAGNOSIS — E782 Mixed hyperlipidemia: Secondary | ICD-10-CM | POA: Diagnosis not present

## 2019-09-27 DIAGNOSIS — Z0000 Encounter for general adult medical examination without abnormal findings: Secondary | ICD-10-CM | POA: Diagnosis not present

## 2019-09-27 DIAGNOSIS — R319 Hematuria, unspecified: Secondary | ICD-10-CM | POA: Diagnosis not present

## 2019-09-27 DIAGNOSIS — N183 Chronic kidney disease, stage 3 unspecified: Secondary | ICD-10-CM | POA: Diagnosis not present

## 2019-09-27 DIAGNOSIS — E559 Vitamin D deficiency, unspecified: Secondary | ICD-10-CM | POA: Diagnosis not present

## 2019-09-27 DIAGNOSIS — G4733 Obstructive sleep apnea (adult) (pediatric): Secondary | ICD-10-CM | POA: Diagnosis not present

## 2019-09-27 DIAGNOSIS — Z720 Tobacco use: Secondary | ICD-10-CM | POA: Diagnosis not present

## 2019-09-27 DIAGNOSIS — M810 Age-related osteoporosis without current pathological fracture: Secondary | ICD-10-CM | POA: Diagnosis not present

## 2019-10-02 ENCOUNTER — Encounter: Payer: Self-pay | Admitting: Internal Medicine

## 2019-10-03 ENCOUNTER — Encounter: Payer: Self-pay | Admitting: Adult Health

## 2019-10-03 ENCOUNTER — Ambulatory Visit: Payer: PPO | Admitting: Adult Health

## 2019-10-03 ENCOUNTER — Other Ambulatory Visit: Payer: Self-pay

## 2019-10-03 ENCOUNTER — Telehealth: Payer: Self-pay | Admitting: Adult Health

## 2019-10-03 DIAGNOSIS — J219 Acute bronchiolitis, unspecified: Secondary | ICD-10-CM

## 2019-10-03 DIAGNOSIS — Z720 Tobacco use: Secondary | ICD-10-CM | POA: Diagnosis not present

## 2019-10-03 DIAGNOSIS — J449 Chronic obstructive pulmonary disease, unspecified: Secondary | ICD-10-CM | POA: Diagnosis not present

## 2019-10-03 DIAGNOSIS — R911 Solitary pulmonary nodule: Secondary | ICD-10-CM | POA: Diagnosis not present

## 2019-10-03 MED ORDER — STIOLTO RESPIMAT 2.5-2.5 MCG/ACT IN AERS
2.0000 | INHALATION_SPRAY | Freq: Every day | RESPIRATORY_TRACT | 0 refills | Status: DC
Start: 2019-10-03 — End: 2019-11-28

## 2019-10-03 MED ORDER — STIOLTO RESPIMAT 2.5-2.5 MCG/ACT IN AERS: 2.0000 | INHALATION_SPRAY | Freq: Every day | RESPIRATORY_TRACT | 6 refills | Status: DC

## 2019-10-03 NOTE — Addendum Note (Signed)
Addended by: Tery Sanfilippo R on: 10/03/2019 12:01 PM   Modules accepted: Orders

## 2019-10-03 NOTE — Patient Instructions (Signed)
Change Spiriva to Stiolto 2 puffs daily - rinse after use.  Ventolin inhaler As needed  Wheezing /shortness of breath.  Activity as tolerated.  Work on not smoking.  Follow up with Dr. Chase Caller or Jericho Alcorn  in 4 months  and As needed

## 2019-10-03 NOTE — Assessment & Plan Note (Signed)
Smokers related bronchiolitis patient is encouraged on smoking cessation.

## 2019-10-03 NOTE — Assessment & Plan Note (Signed)
Smoking cessation encouraged!

## 2019-10-03 NOTE — Assessment & Plan Note (Signed)
High-resolution CT chest showed very small pulmonary nodules. Patient been referred to the low-dose CT screening program.

## 2019-10-03 NOTE — Assessment & Plan Note (Addendum)
Moderate to severe COPD-PFTs reviewed with patient today -clinically patient is stable. Patient has pretty significant disease and with ongoing smoking. Does not have much perceived benefit of Spiriva. Will change to Stiolto Smoking cessation encouraged. Patient was referred to the low-dose CT screening program  Plan  Patient Instructions  Change Spiriva to Stiolto 2 puffs daily - rinse after use.  Ventolin inhaler As needed  Wheezing /shortness of breath.  Activity as tolerated.  Work on not smoking.  Follow up with Dr. Chase Caller or [facility] Hulet  in 4 months  and As needed

## 2019-10-03 NOTE — Progress Notes (Signed)
'@Patient'  ID: Kimberly [name], female    DOB: [birth year], 69 y.o.   MRN: [id number]  Chief Complaint  Patient presents with  . Follow-up    COPD    Referring provider: Mayra Neer, MD  HPI: 69 year old female active smoker seen for pulmonary consult June 22, 2019 for COPD and shortness of breath Medical history significant for depression hyperlipidemia and hypertension  TEST/EVENTS :  04/2019 CT chest Small partially loculated pleural effusions at each lung base. Associated mild bibasilar atelectasis. No evidence of pneumonia or pulmonary edema. 2. No pulmonary embolism seen.  06/2019 CAT score 13  High-resolution CT chest August 11, 2019 - for interstitial lung disease, mild diffuse bilateral bronchial wall thickening with innumerable tiny centrilobular nodules most numerous and the lung apices consistent with a smoking-related bronchitis/respiratory bronchiolitis, 3 mm right upper lobe nodule.  Atherosclerosis noted, previous pleural effusions resolved  2D echo July 18, 2019 EF 65 to 93%, grade 1 diastolic dysfunction, negative for apical thrombus.  Normal pulmonary artery systolic pressure.  Autoimmune/connective tissue labs negative except for sed rate elevated at 66 and ANA positive , 1: 40, cytoplasmic, CCP negative  SARS-CoV-2 IgG negative igh-resolution CT chest done on August 11, 2019 showed negative for interstitial lung disease.  Positive for bronchial wall thickening and innumerable pulmonary nodules in the lung apices.  Felt consistent with smoking-related bronchitis/bronchiolitis.  Right upper lobe 3 mm nodule.  Previous pleural effusions were resolved.   2D echo showed a preserved EF and normal pulmonary artery pressures with grade 1 diastolic dysfunction.   10/03/2019 Follow up ; COPD  Patient presents for a 1 month follow-up.  Patient was seen for pulmonary consult earlier this year for COPD and shortness of breath.  She is an active smoker.  Patient was  recommended on smoking cessation. High-resolution CT chest negative for ILD , showed smoking-related bronchitis/bronchiolitis. Autoimmune neg except for slightly elevated ANA and ESR .   Has been on Spiriva but can not tell much difference in breathing .   She had pulmonary function testing September 13, 2019.  That showed moderate airflow obstruction with an FEV1 at 64%, ratio 67, FVC 71%, significant bronchodilator response.  Significant mid flow obstruction with significant reversibility and a normal DLCO at 95% Since last visit patient feels good with no flare of cough or wheezing .  On ACE inhibitor , denies increased cough .  Got flu shot last week.  Remains active , lives with husband. Travels some. Went to [place] , walked a lot without any problem. Does housework without trouble.   Declines Covid vaccine.   Allergies  Allergen Reactions  . Oxycodone Itching  . Penicillins Itching, Rash and Other (See Comments)    Did it involve swelling of the face/tongue/throat, SOB, or low BP? No Did it involve sudden or severe rash/hives, skin peeling, or any reaction on the inside of your mouth or nose? Yes Did you need to seek medical attention at a hospital or doctor's office? Yes When did it last happen? Long time ago If all above answers are "NO", may proceed with cephalosporin use.   . Sulfonamide Derivatives Rash    Immunization History  Administered Date(s) Administered  . Influenza, High Dose Seasonal PF 09/13/2018  . Influenza,inj,quad, With Preservative 09/26/2016    Past Medical History:  Diagnosis Date  . Arthritis    osteoarthritis  . Bronchitis   . Complication of anesthesia    s/p bladder retention-"only time it happened"  . COPD (chronic  obstructive pulmonary disease) ([place])   . Depression   . Generalized headaches   . Heart murmur    heard occ  . Hiatal hernia    small- no problems  . History of blood in urine    microscopic -no problems identified," occ." high  protein in urine"  . History of goiter   . Hyperlipemia   . Hypertension   . Osteoporosis   . Sigmoid diverticulosis    pt unaware  . Subcutaneous mass    RIGHT FKANK AND LEFT LATERAL CHEST WALL  . Wears glasses   . Wears glasses   . Wears partial dentures    Upper and lower    Tobacco History: Social History   Tobacco Use  Smoking Status Current Every Day Smoker  . Packs/day: 1.00  . Years: 45.00  . Pack years: 45.00  . Types: Cigarettes  Smokeless Tobacco Never Used  Tobacco Comment   smoking between 0.5-1ppd   Ready to quit: No Counseling given: Yes Comment: smoking between 0.5-1ppd   Outpatient Medications Prior to Visit  Medication Sig Dispense Refill  . acetaminophen (TYLENOL) 500 MG tablet Take 1,000 mg by mouth every 6 (six) hours as needed for moderate pain or headache.     . albuterol (VENTOLIN HFA) 108 (90 Base) MCG/ACT inhaler Inhale 2 puffs into the lungs every 4 (four) hours as needed for wheezing or shortness of breath.    . Calcium Carb-Cholecalciferol (CALCIUM 600+D3 PO) Take 1 tablet by mouth daily.    . Cholecalciferol (VITAMIN D) 1000 UNITS capsule Take 1,000 Units by mouth every morning.     . Coenzyme Q10 (COQ10) 100 MG CAPS Take 100 mg by mouth daily.    Marland Kitchen denosumab (PROLIA) 60 MG/ML SOLN injection Inject 60 mg into the skin every 6 (six) months. Administer in upper arm, thigh, or abdomen    . diclofenac (VOLTAREN) 50 MG EC tablet Take 50 mg by mouth 2 (two) times daily.    Marland Kitchen lisinopril (PRINIVIL,ZESTRIL) 20 MG tablet Take 20 mg by mouth every morning.     Marland Kitchen LIVALO 2 MG TABS Take 1 tablet by mouth daily.     . Magnesium 250 MG TABS Take 250 mg by mouth at bedtime.    . naproxen sodium (ALEVE) 220 MG tablet Take 220 mg by mouth as needed.    Marland Kitchen OLANZapine (ZYPREXA) 2.5 MG tablet Take 5 mg by mouth at bedtime.    . Tiotropium Bromide Monohydrate (SPIRIVA RESPIMAT) 2.5 MCG/ACT AERS Inhale 2 puffs into the lungs daily. 4 g 5  . venlafaxine XR  (EFFEXOR-XR) 75 MG 24 hr capsule Take 75 mg by mouth daily.     No facility-administered medications prior to visit.     Review of Systems:   Constitutional:   No  weight loss, night sweats,  Fevers, chills, fatigue, or  lassitude.  HEENT:   No headaches,  Difficulty swallowing,  Tooth/dental problems, or  Sore throat,                No sneezing, itching, ear ache, nasal congestion, post nasal drip,   CV:  No chest pain,  Orthopnea, PND, swelling in lower extremities, anasarca, dizziness, palpitations, syncope.   GI  No heartburn, indigestion, abdominal pain, nausea, vomiting, diarrhea, change in bowel habits, loss of appetite, bloody stools.   Resp:   No wheezing.  No chest wall deformity  Skin: no rash or lesions.  GU: no dysuria, change in color of urine, no urgency  or frequency.  No flank pain, no hematuria   MS:  No joint pain or swelling.  No decreased range of motion.  No back pain.    Physical Exam  BP 114/68 (BP Location: Left Arm, Cuff Size: Normal)   Pulse 89   Temp 98.3 F (36.8 C) (Temporal)   Ht '4\' 10"'  (1.473 m)   Wt 121 lb 6.4 oz (55.1 kg)   SpO2 93% Comment: RA  BMI 25.37 kg/m   GEN: A/Ox3; pleasant , NAD, well nourished    HEENT:  [place]/AT,  NOSE-clear, THROAT-clear, no lesions, no postnasal drip or exudate noted.   NECK:  Supple w/ fair ROM; no JVD; normal carotid impulses w/o bruits; no thyromegaly or nodules palpated; no lymphadenopathy.    RESP  Clear  P & A; w/o, wheezes/ rales/ or rhonchi. no accessory muscle use, no dullness to percussion  CARD:  RRR, no m/r/g, no peripheral edema, pulses intact, no cyanosis or clubbing.  GI:   Soft & nt; nml bowel sounds; no organomegaly or masses detected.   Musco: Warm bil, no deformities or joint swelling noted.   Neuro: alert, no focal deficits noted.    Skin: Warm, no lesions or rashes    Lab Results:   BNP  ProBNP No results found for: PROBNP  Imaging: No results found.    PFT Results  Latest Ref Rng & Units 09/13/2019  FVC-Pre L 1.26  FVC-Predicted Pre % 51  FVC-Post L 1.75  FVC-Predicted Post % 71  Pre FEV1/FVC % % 72  Post FEV1/FCV % % 67  FEV1-Pre L 0.91  FEV1-Predicted Pre % 49  FEV1-Post L 1.18  DLCO uncorrected ml/min/mmHg 15.57  DLCO UNC% % 95  DLCO corrected ml/min/mmHg 15.04  DLCO COR %Predicted % 91  DLVA Predicted % 99  TLC L 4.23  TLC % Predicted % 98  RV % Predicted % 142    No results found for: NITRICOXIDE      Assessment & Plan:   COPD (chronic obstructive pulmonary disease) (HCC) Moderate to severe COPD-PFTs reviewed with patient today -clinically patient is stable. Patient has pretty significant disease and with ongoing smoking. Does not have much perceived benefit of Spiriva. Will change to Stiolto Smoking cessation encouraged. Patient was referred to the low-dose CT screening program  Plan  Patient Instructions  Change Spiriva to Stiolto 2 puffs daily - rinse after use.  Ventolin inhaler As needed  Wheezing /shortness of breath.  Activity as tolerated.  Work on not smoking.  Follow up with Dr. Chase Caller or Evadne Ose  in 4 months  and As needed          Bronchiolitis Smokers related bronchiolitis patient is encouraged on smoking cessation.  Pulmonary nodule High-resolution CT chest showed very small pulmonary nodules. Patient been referred to the low-dose CT screening program.  Tobacco abuse Smoking cessation encouraged     Rexene Edison, NP 10/03/2019

## 2019-10-03 NOTE — Telephone Encounter (Signed)
PA request received from [facility]  Drug requested: Stiolto CMM Key: [id number] Tried/failed: Spiriva Covered alternatives: none listed  PA request has been sent to plan, and a determination is expected within 3 days.   Routing to [place] for follow-up.

## 2019-10-10 DIAGNOSIS — G4733 Obstructive sleep apnea (adult) (pediatric): Secondary | ICD-10-CM | POA: Diagnosis not present

## 2019-10-13 NOTE — Telephone Encounter (Signed)
PA has been approved.  Pharmacy aware.  Nothing further needed at this time- will close encounter.

## 2019-11-02 DIAGNOSIS — G4733 Obstructive sleep apnea (adult) (pediatric): Secondary | ICD-10-CM | POA: Diagnosis not present

## 2019-11-09 DIAGNOSIS — G4733 Obstructive sleep apnea (adult) (pediatric): Secondary | ICD-10-CM | POA: Diagnosis not present

## 2019-11-28 ENCOUNTER — Other Ambulatory Visit: Payer: Self-pay

## 2019-11-28 ENCOUNTER — Encounter: Payer: Self-pay | Admitting: Internal Medicine

## 2019-11-28 ENCOUNTER — Ambulatory Visit: Payer: PPO | Admitting: *Deleted

## 2019-11-28 ENCOUNTER — Telehealth: Payer: Self-pay | Admitting: Adult Health

## 2019-11-28 VITALS — Ht <= 58 in | Wt 125.4 lb

## 2019-11-28 DIAGNOSIS — Z01818 Encounter for other preprocedural examination: Secondary | ICD-10-CM

## 2019-11-28 DIAGNOSIS — Z1211 Encounter for screening for malignant neoplasm of colon: Secondary | ICD-10-CM

## 2019-11-28 MED ORDER — SUTAB 1479-225-188 MG PO TABS
1.0000 | ORAL_TABLET | Freq: Once | ORAL | 0 refills | Status: AC
Start: 2019-11-28 — End: 2019-11-28

## 2019-11-28 NOTE — Telephone Encounter (Signed)
Rx for stiolto had been sent to the pharmacy for pt 10/03/19 and there were additional refills that had been added. Called and spoke with pt letting her know this info and she verbalized understanding. Nothing further needed.

## 2019-11-28 NOTE — Progress Notes (Signed)
Wears CPAP at noc, no oxygen used at home  covid test 12-06-19 at 9:30 am  Pt is aware that care partner will wait in the car during procedure; if they feel like they will be too hot or cold to wait in the car; they may wait in the 4 th floor lobby. Patient is aware to bring only one care partner. We want them to wear a mask (we do not have any that we can provide them), practice social distancing, and we will check their temperatures when they get here.  I did remind the patient that their care partner needs to stay in the parking lot the entire time and have a cell phone available, we will call them when the pt is ready for discharge. Patient will wear mask into building.   No trouble with anesthesia, difficulty with intubation or hx/fam hx of malignant hyperthermia per pt   No egg or soy allergy  No home oxygen use   No medications for weight loss taken  Pt denies constipation issues if she takes her Probiotic daily   Sutab code put into RX and paper copy given to pt to show pharmacy

## 2019-12-06 ENCOUNTER — Other Ambulatory Visit: Payer: Self-pay | Admitting: Internal Medicine

## 2019-12-06 DIAGNOSIS — Z1159 Encounter for screening for other viral diseases: Secondary | ICD-10-CM | POA: Diagnosis not present

## 2019-12-06 LAB — SARS CORONAVIRUS 2 (TAT 6-24 HRS): SARS Coronavirus 2: NEGATIVE

## 2019-12-12 ENCOUNTER — Encounter: Payer: Self-pay | Admitting: Internal Medicine

## 2019-12-12 ENCOUNTER — Other Ambulatory Visit: Payer: Self-pay

## 2019-12-12 ENCOUNTER — Ambulatory Visit (AMBULATORY_SURGERY_CENTER): Payer: PPO | Admitting: Internal Medicine

## 2019-12-12 VITALS — BP 116/62 | HR 83 | Temp 98.6°F | Resp 18 | Ht <= 58 in | Wt 125.4 lb

## 2019-12-12 DIAGNOSIS — J449 Chronic obstructive pulmonary disease, unspecified: Secondary | ICD-10-CM | POA: Diagnosis not present

## 2019-12-12 DIAGNOSIS — I10 Essential (primary) hypertension: Secondary | ICD-10-CM | POA: Diagnosis not present

## 2019-12-12 DIAGNOSIS — Z86010 Personal history of colonic polyps: Secondary | ICD-10-CM

## 2019-12-12 DIAGNOSIS — Z1211 Encounter for screening for malignant neoplasm of colon: Secondary | ICD-10-CM

## 2019-12-12 DIAGNOSIS — K635 Polyp of colon: Secondary | ICD-10-CM

## 2019-12-12 DIAGNOSIS — D122 Benign neoplasm of ascending colon: Secondary | ICD-10-CM

## 2019-12-12 MED ORDER — SODIUM CHLORIDE 0.9 % IV SOLN: 500.0000 mL | Freq: Once | INTRAVENOUS | Status: DC

## 2019-12-12 NOTE — Op Note (Signed)
[facility] Patient Name: Kimberly [name] Procedure Date: 12/12/2019 11:36 AM MRN: [id number] Endoscopist: Docia Chuck. Henrene Pastor , MD Age: 69 Referring MD:  Date of Birth: 1950-06-26 Gender: Female Account #: [id number] Procedure:                Colonoscopy with cold snare polypectomy x 1 Indications:              Screening for colorectal malignant neoplasm.                            Previous examinations 2006 and 2011 were negative                            for neoplasia Medicines:                Monitored Anesthesia Care Procedure:                Pre-Anesthesia Assessment:                           - Prior to the procedure, a History and Physical                            was performed, and patient medications and                            allergies were reviewed. The patient's tolerance of                            previous anesthesia was also reviewed. The risks                            and benefits of the procedure and the sedation                            options and risks were discussed with the patient.                            All questions were answered, and informed consent                            was obtained. Prior Anticoagulants: The patient has                            taken no previous anticoagulant or antiplatelet                            agents. ASA Grade Assessment: II - A patient with                            mild systemic disease. After reviewing the risks                            and benefits, the patient was deemed in  satisfactory condition to undergo the procedure.                           After obtaining informed consent, the colonoscope                            was passed under direct vision. Throughout the                            procedure, the patient's blood pressure, pulse, and                            oxygen saturations were monitored continuously. The                            Olympus Slim Peds  was introduced through the anus                            and advanced to the the cecum, identified by                            appendiceal orifice and ileocecal valve. The                            ileocecal valve, appendiceal orifice, and rectum                            were photographed. The quality of the bowel                            preparation was excellent. The colonoscopy was                            performed without difficulty. The patient tolerated                            the procedure well. The bowel preparation used was                            SUPREP via split dose instruction. Scope In: 11:49:34 AM Scope Out: 12:09:47 PM Scope Withdrawal Time: 0 hours 9 minutes 33 seconds  Total Procedure Duration: 0 hours 20 minutes 13 seconds  Findings:                 A 5 mm polyp was found in the ascending colon. The                            polyp was sessile. The polyp was removed with a                            cold snare. Resection and retrieval were complete.                           Multiple diverticula were found in  the sigmoid                            colon. Rectosigmoid stenosis.                           The exam was otherwise without abnormality on                            direct and retroflexion views. Complications:            No immediate complications. Estimated blood loss:                            None. Estimated Blood Loss:     Estimated blood loss: none. Impression:               - One 5 mm polyp in the ascending colon, removed                            with a cold snare. Resected and retrieved.                           - Diverticulosis in the sigmoid colon. There was a                            fixed stenosis of the rectosigmoid region which was                            transversed with the slim pediatric colonoscope.                           - The examination was otherwise normal on direct                            and retroflexion  views. Recommendation:           - Repeat colonoscopy is not recommended for                            surveillance.                           - Patient has a contact number available for                            emergencies. The signs and symptoms of potential                            delayed complications were discussed with the                            patient. Return to normal activities tomorrow.                            Written discharge instructions were provided to the  patient.                           - Resume previous diet.                           - Continue present medications.                           - Await pathology results. Docia Chuck. Henrene Pastor, MD 12/12/2019 12:15:14 PM This report has been signed electronically.

## 2019-12-12 NOTE — Patient Instructions (Signed)
YOU HAD AN ENDOSCOPIC PROCEDURE TODAY AT [facility]:   Refer to the procedure report that was given to you for any specific questions about what was found during the examination.  If the procedure report does not answer your questions, please call your gastroenterologist to clarify.  If you requested that your care partner not be given the details of your procedure findings, then the procedure report has been included in a sealed envelope for you to review at your convenience later.  YOU SHOULD EXPECT: Some feelings of bloating in the abdomen. Passage of more gas than usual.  Walking can help get rid of the air that was put into your GI tract during the procedure and reduce the bloating. If you had a lower endoscopy (such as a colonoscopy or flexible sigmoidoscopy) you may notice spotting of blood in your stool or on the toilet paper. If you underwent a bowel prep for your procedure, you may not have a normal bowel movement for a few days.  **Handouts given on polyps and Diverticulosis**   Please Note:  You might notice some irritation and congestion in your nose or some drainage.  This is from the oxygen used during your procedure.  There is no need for concern and it should clear up in a day or so.  SYMPTOMS TO REPORT IMMEDIATELY:   Following lower endoscopy (colonoscopy or flexible sigmoidoscopy):  Excessive amounts of blood in the stool  Significant tenderness or worsening of abdominal pains  Swelling of the abdomen that is new, acute  Fever of 100F or higher   For urgent or emergent issues, a gastroenterologist can be reached at any hour by calling [phone]. Do not use MyChart messaging for urgent concerns.    DIET:  We do recommend a small meal at first, but then you may proceed to your regular diet.  Drink plenty of fluids but you should avoid alcoholic beverages for 24 hours.  ACTIVITY:  You should plan to take it easy for the rest of today and you should NOT  DRIVE or use heavy machinery until tomorrow (because of the sedation medicines used during the test).    FOLLOW UP: Our staff will call the number listed on your records 48-72 hours following your procedure to check on you and address any questions or concerns that you may have regarding the information given to you following your procedure. If we do not reach you, we will leave a message.  We will attempt to reach you two times.  During this call, we will ask if you have developed any symptoms of COVID 19. If you develop any symptoms (ie: fever, flu-like symptoms, shortness of breath, cough etc.) before then, please call [phone].  If you test positive for Covid 19 in the 2 weeks post procedure, please call and report this information to [place].    If any biopsies were taken you will be contacted by phone or by letter within the next 1-3 weeks.  Please call us at [phone] if you have not heard about the biopsies in 3 weeks.    SIGNATURES/CONFIDENTIALITY: You and/or your care partner have signed paperwork which will be entered into your electronic medical record.  These signatures attest to the fact that that the information above on your After Visit Summary has been reviewed and is understood.  Full responsibility of the confidentiality of this discharge information lies with you and/or your care-partner.

## 2019-12-12 NOTE — Progress Notes (Signed)
Vs CW  Pt's states no medical or surgical changes since previsit or office visit.  

## 2019-12-12 NOTE — Progress Notes (Signed)
Report to PACU, RN, vss, BBS= Clear.  

## 2019-12-12 NOTE — Progress Notes (Signed)
Called to room to assist during endoscopic procedure.  Patient ID and intended procedure confirmed with present staff. Received instructions for my participation in the procedure from the performing physician.  

## 2019-12-14 ENCOUNTER — Telehealth: Payer: Self-pay

## 2019-12-14 NOTE — Telephone Encounter (Signed)
  Follow up Call-  Call back number 12/12/2019  Post procedure Call Back phone  # [phone]  Permission to leave phone message Yes  Some recent data might be hidden     Patient questions:  Do you have a fever, pain , or abdominal swelling? No. Pain Score  0 *  Have you tolerated food without any problems? Yes.    Have you been able to return to your normal activities? Yes.    Do you have any questions about your discharge instructions: Diet   No. Medications  No. Follow up visit  No.  Do you have questions or concerns about your Care? No.  Actions: * If pain score is 4 or above: No action needed, pain <4.  1. Have you developed a fever since your procedure? no  2.   Have you had an respiratory symptoms (SOB or cough) since your procedure? no  3.   Have you tested positive for COVID 19 since your procedure no  4.   Have you had any family members/close contacts diagnosed with the COVID 19 since your procedure?  no   If yes to any of these questions please route to Joylene John, RN and Joella Prince, RN

## 2019-12-19 ENCOUNTER — Encounter: Payer: Self-pay | Admitting: Internal Medicine

## 2019-12-29 DIAGNOSIS — E782 Mixed hyperlipidemia: Secondary | ICD-10-CM | POA: Diagnosis not present

## 2020-01-10 ENCOUNTER — Emergency Department (HOSPITAL_BASED_OUTPATIENT_CLINIC_OR_DEPARTMENT_OTHER): Payer: PPO

## 2020-01-10 ENCOUNTER — Emergency Department (HOSPITAL_BASED_OUTPATIENT_CLINIC_OR_DEPARTMENT_OTHER)
Admission: EM | Admit: 2020-01-10 | Discharge: 2020-01-10 | Disposition: A | Discharge status: Home / Self Care | Payer: PPO | Source: Home / Self Care

## 2020-01-10 ENCOUNTER — Other Ambulatory Visit: Payer: Self-pay

## 2020-01-10 ENCOUNTER — Encounter (HOSPITAL_BASED_OUTPATIENT_CLINIC_OR_DEPARTMENT_OTHER): Payer: Self-pay | Admitting: *Deleted

## 2020-01-10 DIAGNOSIS — Z885 Allergy status to narcotic agent status: Secondary | ICD-10-CM | POA: Diagnosis not present

## 2020-01-10 DIAGNOSIS — J441 Chronic obstructive pulmonary disease with (acute) exacerbation: Secondary | ICD-10-CM | POA: Diagnosis present

## 2020-01-10 DIAGNOSIS — Z5321 Procedure and treatment not carried out due to patient leaving prior to being seen by health care provider: Secondary | ICD-10-CM | POA: Insufficient documentation

## 2020-01-10 DIAGNOSIS — R000 Tachycardia, unspecified: Secondary | ICD-10-CM | POA: Diagnosis present

## 2020-01-10 DIAGNOSIS — Z8709 Personal history of other diseases of the respiratory system: Secondary | ICD-10-CM | POA: Diagnosis not present

## 2020-01-10 DIAGNOSIS — M791 Myalgia, unspecified site: Secondary | ICD-10-CM | POA: Insufficient documentation

## 2020-01-10 DIAGNOSIS — Z880 Allergy status to penicillin: Secondary | ICD-10-CM | POA: Diagnosis not present

## 2020-01-10 DIAGNOSIS — F32A Depression, unspecified: Secondary | ICD-10-CM | POA: Diagnosis present

## 2020-01-10 DIAGNOSIS — R7989 Other specified abnormal findings of blood chemistry: Secondary | ICD-10-CM | POA: Diagnosis present

## 2020-01-10 DIAGNOSIS — Z79899 Other long term (current) drug therapy: Secondary | ICD-10-CM | POA: Diagnosis not present

## 2020-01-10 DIAGNOSIS — R509 Fever, unspecified: Secondary | ICD-10-CM | POA: Diagnosis not present

## 2020-01-10 DIAGNOSIS — J189 Pneumonia, unspecified organism: Secondary | ICD-10-CM | POA: Diagnosis present

## 2020-01-10 DIAGNOSIS — F419 Anxiety disorder, unspecified: Secondary | ICD-10-CM | POA: Diagnosis present

## 2020-01-10 DIAGNOSIS — R0602 Shortness of breath: Secondary | ICD-10-CM | POA: Insufficient documentation

## 2020-01-10 DIAGNOSIS — T380X5A Adverse effect of glucocorticoids and synthetic analogues, initial encounter: Secondary | ICD-10-CM | POA: Diagnosis not present

## 2020-01-10 DIAGNOSIS — E785 Hyperlipidemia, unspecified: Secondary | ICD-10-CM | POA: Diagnosis present

## 2020-01-10 DIAGNOSIS — I119 Hypertensive heart disease without heart failure: Secondary | ICD-10-CM | POA: Diagnosis present

## 2020-01-10 DIAGNOSIS — F17210 Nicotine dependence, cigarettes, uncomplicated: Secondary | ICD-10-CM | POA: Diagnosis present

## 2020-01-10 DIAGNOSIS — R059 Cough, unspecified: Secondary | ICD-10-CM | POA: Diagnosis not present

## 2020-01-10 DIAGNOSIS — Z882 Allergy status to sulfonamides status: Secondary | ICD-10-CM | POA: Diagnosis not present

## 2020-01-10 DIAGNOSIS — M810 Age-related osteoporosis without current pathological fracture: Secondary | ICD-10-CM | POA: Diagnosis present

## 2020-01-10 DIAGNOSIS — Z720 Tobacco use: Secondary | ICD-10-CM | POA: Diagnosis not present

## 2020-01-10 DIAGNOSIS — Z888 Allergy status to other drugs, medicaments and biological substances status: Secondary | ICD-10-CM | POA: Diagnosis not present

## 2020-01-10 DIAGNOSIS — Z8249 Family history of ischemic heart disease and other diseases of the circulatory system: Secondary | ICD-10-CM | POA: Diagnosis not present

## 2020-01-10 DIAGNOSIS — J9601 Acute respiratory failure with hypoxia: Secondary | ICD-10-CM | POA: Diagnosis present

## 2020-01-10 DIAGNOSIS — I10 Essential (primary) hypertension: Secondary | ICD-10-CM | POA: Diagnosis not present

## 2020-01-10 DIAGNOSIS — R739 Hyperglycemia, unspecified: Secondary | ICD-10-CM | POA: Diagnosis not present

## 2020-01-10 DIAGNOSIS — J440 Chronic obstructive pulmonary disease with acute lower respiratory infection: Secondary | ICD-10-CM | POA: Diagnosis present

## 2020-01-10 DIAGNOSIS — Z20822 Contact with and (suspected) exposure to covid-19: Secondary | ICD-10-CM | POA: Diagnosis present

## 2020-01-10 MED ORDER — ACETAMINOPHEN 325 MG PO TABS
650.0000 mg | ORAL_TABLET | Freq: Once | ORAL | Status: AC
  Administered 2020-01-10: 16:00:00 650 mg via ORAL
  Filled 2020-01-10: qty 2

## 2020-01-10 NOTE — ED Notes (Signed)
Patient states she is unable to stay and wait to be seen any longer; states her husband is diabetic and needs to eat. Advised patient to notify her PCP Dr Philipp Deputy of recent lab and xray tests done here and to return for any worsening condition. Patient verbalized understanding.

## 2020-01-10 NOTE — ED Triage Notes (Signed)
C/o SOB  And fever , body aches x 4 days

## 2020-01-12 ENCOUNTER — Emergency Department (HOSPITAL_COMMUNITY): Payer: PPO

## 2020-01-12 ENCOUNTER — Encounter (HOSPITAL_COMMUNITY): Payer: Self-pay | Admitting: Emergency Medicine

## 2020-01-12 ENCOUNTER — Inpatient Hospital Stay (HOSPITAL_COMMUNITY): Payer: PPO

## 2020-01-12 ENCOUNTER — Other Ambulatory Visit: Payer: Self-pay

## 2020-01-12 ENCOUNTER — Inpatient Hospital Stay (HOSPITAL_COMMUNITY)
Admission: EM | Admit: 2020-01-12 | Discharge: 2020-01-15 | DRG: 193 | Disposition: A | Discharge status: Home / Self Care | Payer: PPO | Attending: Internal Medicine | Admitting: Internal Medicine

## 2020-01-12 DIAGNOSIS — I119 Hypertensive heart disease without heart failure: Secondary | ICD-10-CM | POA: Diagnosis present

## 2020-01-12 DIAGNOSIS — Z8709 Personal history of other diseases of the respiratory system: Secondary | ICD-10-CM | POA: Diagnosis not present

## 2020-01-12 DIAGNOSIS — I10 Essential (primary) hypertension: Secondary | ICD-10-CM | POA: Diagnosis present

## 2020-01-12 DIAGNOSIS — F32A Depression, unspecified: Secondary | ICD-10-CM | POA: Diagnosis present

## 2020-01-12 DIAGNOSIS — Z888 Allergy status to other drugs, medicaments and biological substances status: Secondary | ICD-10-CM | POA: Diagnosis not present

## 2020-01-12 DIAGNOSIS — T380X5A Adverse effect of glucocorticoids and synthetic analogues, initial encounter: Secondary | ICD-10-CM | POA: Diagnosis not present

## 2020-01-12 DIAGNOSIS — Z8249 Family history of ischemic heart disease and other diseases of the circulatory system: Secondary | ICD-10-CM

## 2020-01-12 DIAGNOSIS — F419 Anxiety disorder, unspecified: Secondary | ICD-10-CM | POA: Diagnosis present

## 2020-01-12 DIAGNOSIS — Z20822 Contact with and (suspected) exposure to covid-19: Secondary | ICD-10-CM | POA: Diagnosis present

## 2020-01-12 DIAGNOSIS — E785 Hyperlipidemia, unspecified: Secondary | ICD-10-CM | POA: Diagnosis not present

## 2020-01-12 DIAGNOSIS — Z79899 Other long term (current) drug therapy: Secondary | ICD-10-CM | POA: Diagnosis not present

## 2020-01-12 DIAGNOSIS — Z885 Allergy status to narcotic agent status: Secondary | ICD-10-CM

## 2020-01-12 DIAGNOSIS — Z882 Allergy status to sulfonamides status: Secondary | ICD-10-CM | POA: Diagnosis not present

## 2020-01-12 DIAGNOSIS — Z880 Allergy status to penicillin: Secondary | ICD-10-CM

## 2020-01-12 DIAGNOSIS — M810 Age-related osteoporosis without current pathological fracture: Secondary | ICD-10-CM | POA: Diagnosis present

## 2020-01-12 DIAGNOSIS — R509 Fever, unspecified: Secondary | ICD-10-CM | POA: Diagnosis not present

## 2020-01-12 DIAGNOSIS — R7989 Other specified abnormal findings of blood chemistry: Secondary | ICD-10-CM | POA: Diagnosis present

## 2020-01-12 DIAGNOSIS — R059 Cough, unspecified: Secondary | ICD-10-CM | POA: Diagnosis not present

## 2020-01-12 DIAGNOSIS — Z720 Tobacco use: Secondary | ICD-10-CM | POA: Diagnosis not present

## 2020-01-12 DIAGNOSIS — J440 Chronic obstructive pulmonary disease with acute lower respiratory infection: Secondary | ICD-10-CM | POA: Diagnosis present

## 2020-01-12 DIAGNOSIS — J189 Pneumonia, unspecified organism: Secondary | ICD-10-CM | POA: Diagnosis present

## 2020-01-12 DIAGNOSIS — F17210 Nicotine dependence, cigarettes, uncomplicated: Secondary | ICD-10-CM | POA: Diagnosis present

## 2020-01-12 DIAGNOSIS — R739 Hyperglycemia, unspecified: Secondary | ICD-10-CM | POA: Diagnosis not present

## 2020-01-12 DIAGNOSIS — R000 Tachycardia, unspecified: Secondary | ICD-10-CM | POA: Diagnosis present

## 2020-01-12 DIAGNOSIS — R0602 Shortness of breath: Secondary | ICD-10-CM

## 2020-01-12 DIAGNOSIS — J441 Chronic obstructive pulmonary disease with (acute) exacerbation: Secondary | ICD-10-CM | POA: Diagnosis present

## 2020-01-12 DIAGNOSIS — J9601 Acute respiratory failure with hypoxia: Secondary | ICD-10-CM | POA: Diagnosis present

## 2020-01-12 LAB — FERRITIN: Ferritin: 290 ng/mL (ref 11–307)

## 2020-01-12 LAB — CBC
HCT: 39.7 % (ref 36.0–46.0)
Hemoglobin: 13.3 g/dL (ref 12.0–15.0)
MCH: 32.4 pg (ref 26.0–34.0)
MCHC: 33.5 g/dL (ref 30.0–36.0)
MCV: 96.8 fL (ref 80.0–100.0)
Platelets: 274 10*3/uL (ref 150–400)
RBC: 4.1 MIL/uL (ref 3.87–5.11)
RDW: 13.7 % (ref 11.5–15.5)
WBC: 9 10*3/uL (ref 4.0–10.5)
nRBC: 0 % (ref 0.0–0.2)

## 2020-01-12 LAB — BLOOD GAS, ARTERIAL
Acid-base deficit: 2.4 mmol/L — ABNORMAL HIGH (ref 0.0–2.0)
Bicarbonate: 20.5 mmol/L (ref 20.0–28.0)
O2 Saturation: 99.2 %
Patient temperature: 98.6
pCO2 arterial: 31.6 mmHg — ABNORMAL LOW (ref 32.0–48.0)
pH, Arterial: 7.429 (ref 7.350–7.450)
pO2, Arterial: 166 mmHg — ABNORMAL HIGH (ref 83.0–108.0)

## 2020-01-12 LAB — LACTATE DEHYDROGENASE: LDH: 184 U/L (ref 98–192)

## 2020-01-12 LAB — LACTIC ACID, PLASMA: Lactic Acid, Venous: 1.3 mmol/L (ref 0.5–1.9)

## 2020-01-12 LAB — COMPREHENSIVE METABOLIC PANEL
ALT: 20 U/L (ref 0–44)
AST: 29 U/L (ref 15–41)
Albumin: 3.3 g/dL — ABNORMAL LOW (ref 3.5–5.0)
Alkaline Phosphatase: 93 U/L (ref 38–126)
Anion gap: 12 (ref 5–15)
BUN: 14 mg/dL (ref 8–23)
CO2: 21 mmol/L — ABNORMAL LOW (ref 22–32)
Calcium: 9.5 mg/dL (ref 8.9–10.3)
Chloride: 101 mmol/L (ref 98–111)
Creatinine, Ser: 1.05 mg/dL — ABNORMAL HIGH (ref 0.44–1.00)
GFR, Estimated: 58 mL/min — ABNORMAL LOW (ref >60)
Glucose, Bld: 128 mg/dL — ABNORMAL HIGH (ref 70–99)
Potassium: 3.7 mmol/L (ref 3.5–5.1)
Sodium: 134 mmol/L — ABNORMAL LOW (ref 135–145)
Total Bilirubin: 0.6 mg/dL (ref 0.3–1.2)
Total Protein: 7.7 g/dL (ref 6.5–8.1)

## 2020-01-12 LAB — D-DIMER, QUANTITATIVE: D-Dimer, Quant: 2.04 ug/mL-FEU — ABNORMAL HIGH (ref 0.00–0.50)

## 2020-01-12 LAB — RESP PANEL BY RT-PCR (FLU A&B, COVID) ARPGX2
Influenza A by PCR: NEGATIVE
Influenza B by PCR: NEGATIVE
SARS Coronavirus 2 by RT PCR: NEGATIVE

## 2020-01-12 LAB — FIBRINOGEN: Fibrinogen: >800 mg/dL — ABNORMAL HIGH (ref 210–475)

## 2020-01-12 LAB — TRIGLYCERIDES: Triglycerides: 121 mg/dL (ref <150)

## 2020-01-12 LAB — HIV ANTIBODY (ROUTINE TESTING W REFLEX): HIV Screen 4th Generation wRfx: NONREACTIVE

## 2020-01-12 LAB — PROCALCITONIN: Procalcitonin: 4.27 ng/mL

## 2020-01-12 LAB — C-REACTIVE PROTEIN: CRP: 43.8 mg/dL — ABNORMAL HIGH (ref <1.0)

## 2020-01-12 MED ORDER — LEVALBUTEROL HCL 1.25 MG/0.5ML IN NEBU
1.2500 mg | INHALATION_SOLUTION | Freq: Once | RESPIRATORY_TRACT | Status: AC
  Administered 2020-01-12: 1.25 mg via RESPIRATORY_TRACT
  Filled 2020-01-12: qty 0.5

## 2020-01-12 MED ORDER — HYDRALAZINE HCL 20 MG/ML IJ SOLN
10.0000 mg | Freq: Four times a day (QID) | INTRAMUSCULAR | Status: DC | PRN
  Administered 2020-01-13: 10 mg via INTRAVENOUS
  Filled 2020-01-12: qty 1

## 2020-01-12 MED ORDER — ENOXAPARIN SODIUM 60 MG/0.6ML ~~LOC~~ SOLN
60.0000 mg | Freq: Two times a day (BID) | SUBCUTANEOUS | Status: DC
  Administered 2020-01-12: 60 mg via SUBCUTANEOUS
  Filled 2020-01-12 (×2): qty 0.6

## 2020-01-12 MED ORDER — ALBUTEROL SULFATE HFA 108 (90 BASE) MCG/ACT IN AERS
2.0000 | INHALATION_SPRAY | Freq: Once | RESPIRATORY_TRACT | Status: AC
  Administered 2020-01-12: 2 via RESPIRATORY_TRACT

## 2020-01-12 MED ORDER — METHYLPREDNISOLONE SODIUM SUCC 125 MG IJ SOLR
60.0000 mg | Freq: Four times a day (QID) | INTRAMUSCULAR | Status: DC
  Administered 2020-01-12 – 2020-01-13 (×5): 60 mg via INTRAVENOUS
  Filled 2020-01-12 (×4): qty 2

## 2020-01-12 MED ORDER — LACTATED RINGERS IV BOLUS
500.0000 mL | Freq: Once | INTRAVENOUS | Status: AC
  Administered 2020-01-12: 500 mL via INTRAVENOUS

## 2020-01-12 MED ORDER — ENOXAPARIN SODIUM 40 MG/0.4ML ~~LOC~~ SOLN: 40.0000 mg | SUBCUTANEOUS | Status: DC

## 2020-01-12 MED ORDER — LEVOFLOXACIN IN D5W 750 MG/150ML IV SOLN
750.0000 mg | INTRAVENOUS | Status: DC
  Administered 2020-01-13: 750 mg via INTRAVENOUS
  Filled 2020-01-12: qty 150

## 2020-01-12 MED ORDER — NICOTINE 14 MG/24HR TD PT24
14.0000 mg | MEDICATED_PATCH | TRANSDERMAL | Status: DC
  Administered 2020-01-12 – 2020-01-14 (×3): 14 mg via TRANSDERMAL
  Filled 2020-01-12 (×3): qty 1

## 2020-01-12 MED ORDER — IOHEXOL 350 MG/ML SOLN
100.0000 mL | Freq: Once | INTRAVENOUS | Status: AC | PRN
  Administered 2020-01-12: 100 mL via INTRAVENOUS

## 2020-01-12 MED ORDER — SODIUM CHLORIDE 0.9 % IV SOLN
500.0000 mg | Freq: Once | INTRAVENOUS | Status: AC
  Administered 2020-01-12: 500 mg via INTRAVENOUS

## 2020-01-12 MED ORDER — LEVALBUTEROL HCL 1.25 MG/0.5ML IN NEBU
1.2500 mg | INHALATION_SOLUTION | Freq: Four times a day (QID) | RESPIRATORY_TRACT | Status: DC
  Administered 2020-01-13 – 2020-01-15 (×10): 1.25 mg via RESPIRATORY_TRACT
  Filled 2020-01-12 (×12): qty 0.5

## 2020-01-12 MED ORDER — METHYLPREDNISOLONE SODIUM SUCC 125 MG IJ SOLR: 60.0000 mg | Freq: Four times a day (QID) | INTRAMUSCULAR | Status: DC

## 2020-01-12 MED ORDER — ALBUTEROL SULFATE HFA 108 (90 BASE) MCG/ACT IN AERS
INHALATION_SPRAY | RESPIRATORY_TRACT | Status: AC
  Filled 2020-01-12: qty 6.7

## 2020-01-12 MED ORDER — METHYLPREDNISOLONE SODIUM SUCC 125 MG IJ SOLR
125.0000 mg | Freq: Once | INTRAMUSCULAR | Status: AC
  Administered 2020-01-12: 125 mg via INTRAVENOUS
  Filled 2020-01-12: qty 2

## 2020-01-12 MED ORDER — SODIUM CHLORIDE 0.9 % IV SOLN
1.0000 g | Freq: Once | INTRAVENOUS | Status: AC
  Administered 2020-01-12: 1 g via INTRAVENOUS
  Filled 2020-01-12: qty 10

## 2020-01-12 NOTE — H&P (Addendum)
History and Physical    IRISH BREISCH OEV:[id number] DOB: [birth year] DOA: 01/12/2020  PCP: Lupita Raider, MD Patient coming from: Home  Chief Complaint: Short of breath  HPI: Kimberly [name] is a 69 y.o. female with medical history significant of COPD not on oxygen at home admitted with increasing short shortness of breath fever and generalized body aches for 4 days. She was hypoxic in the ER sats in the 80s on room air placed on 2 L of oxygen. She reported having fever cough chills gradually worsening shortness of breath. Denied any chest pain. She has no sick contacts. She lives at home with her husband. She continues to smoke. She was told by her primary physician to come to the ED. Denies any nausea vomiting or diarrhea.  ED Course: Patient received nebulizer treatment IV antibiotics and steroids. When I saw her in the ER patient was extremely tachypneic tachycardic Chest x-ray possible right lower lobe infiltrates.  I do not have ABG on her. Sodium 134 potassium 3.7 BUN 14 creatinine 1.05.  CRP is 43.8 lactic acid is 1.3 procalcitonin is 4.27. D-dimer is 2.04   Review of Systems: As per HPI otherwise all other systems reviewed and are negative  Ambulatory Status: She is ambulatory at baseline.  Past Medical History:  Diagnosis Date  . Anxiety   . Arthritis    osteoarthritis  . Bronchitis   . Cataract   . Complication of anesthesia    s/p bladder retention-"only time it happened"  . COPD (chronic obstructive pulmonary disease) (HCC)   . Depression   . Generalized headaches   . Heart murmur    heard occ  . Hiatal hernia    small- no problems  . History of blood in urine    microscopic -no problems identified," occ." high protein in urine"  . History of goiter   . Hyperlipemia   . Hypertension   . Osteoporosis   . Sigmoid diverticulosis    pt unaware  . Subcutaneous mass    RIGHT FKANK AND LEFT LATERAL CHEST WALL  . Wears glasses   . Wears glasses   . Wears  partial dentures    Upper and lower    Past Surgical History:  Procedure Laterality Date  . BREAST EXCISIONAL BIOPSY Right pt unsure   fibrocystic tissue  . BREAST EXCISIONAL BIOPSY Left pt unsure   scarring  . BREAST SURGERY  [id number]   x3 fibrocystic disease, radial scarring  . BUNIONECTOMY Right   . CATARACT EXTRACTION, BILATERAL    . COLONOSCOPY  2011  . ESOPHAGOGASTRODUODENOSCOPY  2008  . HAMMER TOE SURGERY Right    4,5  . HAND SURGERY Left    x 2 digits"repair fron cuts of tendons" "caught in door"  . KNEE ARTHROSCOPY Left    torn cartilage  . LESION REMOVAL N/A 09/28/2013   Procedure: REMOVAL OF POSTERIOR NECK MASS;  Surgeon: Karie Soda, MD;  Location: [facility];  Service: General;  Laterality: N/A;  . LIPOMA EXCISION  [id number]   x4 -1 abd, 1 rt.. arm, 1 bil. thigh  . MASS EXCISION N/A 07/26/2018   Procedure: REMOVAL OF RIGHT FLANK , LEFT LATERAL CHEST WALL AND RIGHT THIGH SUBCUTANEOUS MASSES;  Surgeon: Karie Soda, MD;  Location: [facility];  Service: General;  Laterality: N/A;  . PARTIAL HYSTERECTOMY  1984   abnormal bleeding  . PARTIAL KNEE ARTHROPLASTY Left   . THYROID LOBECTOMY Left 1970's  . UPPER GASTROINTESTINAL ENDOSCOPY  Social History   Socioeconomic History  . Marital status: Married    Spouse name: Not on file  . Number of children: Not on file  . Years of education: Not on file  . Highest education level: Not on file  Occupational History  . Not on file  Tobacco Use  . Smoking status: Current Every Day Smoker    Packs/day: 1.00    Years: 45.00    Pack years: 45.00    Types: Cigarettes  . Smokeless tobacco: Never Used  . Tobacco comment: smoking between 0.5-1ppd  Vaping Use  . Vaping Use: Never used  Substance and Sexual Activity  . Alcohol use: Yes    Comment: socially, once in a while  . Drug use: No  . Sexual activity: Not Currently    Birth control/protection: Surgical  Other Topics Concern  . Not on file   Social History Narrative  . Not on file   Social Determinants of Health   Financial Resource Strain: Not on file  Food Insecurity: Not on file  Transportation Needs: Not on file  Physical Activity: Not on file  Stress: Not on file  Social Connections: Not on file  Intimate Partner Violence: Not on file    Allergies  Allergen Reactions  . Crestor [Rosuvastatin]     Unknown  . Oxycodone Itching  . Fosamax [Alendronate]     Muscles spasms and cramps  . Penicillins Itching, Rash and Other (See Comments)    Did it involve swelling of the face/tongue/throat, SOB, or low BP? No Did it involve sudden or severe rash/hives, skin peeling, or any reaction on the inside of your mouth or nose? Yes Did you need to seek medical attention at a hospital or doctor's office? Yes When did it last happen? Long time ago If all above answers are "NO", may proceed with cephalosporin use.   . Statins     Muscle spasms and cramps  . Sulfonamide Derivatives Rash    Family History  Problem Relation Age of Onset  . Heart disease Father   . Heart disease Sister        cardiac arrest  . Lymphoma Brother   . Colon cancer Paternal Grandmother   . Esophageal cancer Neg Hx   . Rectal cancer Neg Hx   . Stomach cancer Neg Hx      Prior to Admission medications   Medication Sig Start Date End Date Taking? Authorizing Provider  acetaminophen (TYLENOL) 500 MG tablet Take 1,000 mg by mouth every 6 (six) hours as needed for moderate pain or headache.    Yes [provider]  albuterol (VENTOLIN HFA) 108 (90 Base) MCG/ACT inhaler Inhale 2 puffs into the lungs every 4 (four) hours as needed for wheezing or shortness of breath.   Yes [provider]  Calcium Carb-Cholecalciferol (CALCIUM 600+D3 PO) Take 1 tablet by mouth daily.   Yes [provider]  Cholecalciferol (VITAMIN D) 1000 UNITS capsule Take 1,000 Units by mouth every morning.   Yes [provider]  Coenzyme Q10  (COQ10) 100 MG CAPS Take 100 mg by mouth daily.   Yes [provider]  denosumab (PROLIA) 60 MG/ML SOLN injection Inject 60 mg into the skin every 6 (six) months. Administer in upper arm, thigh, or abdomen   Yes [provider]  lisinopril (PRINIVIL,ZESTRIL) 20 MG tablet Take 20 mg by mouth every morning.    Yes [provider]  LIVALO 2 MG TABS Take 1 tablet by mouth  daily.  04/30/19  Yes [provider]  Magnesium 250 MG TABS Take 250 mg by mouth at bedtime.   Yes [provider]  naproxen sodium (ALEVE) 220 MG tablet Take 220 mg by mouth as needed (For pain).   Yes [provider]  Tiotropium Bromide-Olodaterol (STIOLTO RESPIMAT) 2.5-2.5 MCG/ACT AERS Inhale 2 puffs into the lungs daily. 10/03/19  Yes Parrett, Tammy S, NP  venlafaxine XR (EFFEXOR-XR) 75 MG 24 hr capsule Take 75 mg by mouth daily. 05/30/18  Yes [provider]  Tiotropium Bromide Monohydrate (SPIRIVA RESPIMAT) 2.5 MCG/ACT AERS Inhale 2 puffs into the lungs daily. Patient not taking: No sig reported 08/16/19   Melvenia Needles, NP    Physical Exam: Vitals:   01/12/20 1520 01/12/20 1545 01/12/20 1615 01/12/20 1651  BP: (!) 141/85 (!) 130/59 133/60   Pulse: (!) 123 (!) 138 (!) 125 (!) 133  Resp: (!) 30 (!) 22 (!) 30 (!) 45  Temp:      TempSrc:      SpO2: 94% 97% 95% 91%   Audibly wheezing  . General: Patient in respiratory distress  . eyes:  PERRL, EOMI, normal lids, iris . ENT:  grossly normal hearing, lips & tongue, mmm . Neck:  no LAD, masses or thyromegaly . Cardiovascular:  RRR, no m/r/g. No LE edema.  Marland Kitchen Respiratory:  Diffuse wheezing bilaterally, no w/r/r.  Tachypneic .  abdomen: soft, ntnd, NABS . Skin:  no rash or induration seen on limited exam . Musculoskeletal:  grossly normal tone BUE/BLE, good ROM, no bony abnormality . Psychiatric: grossly normal mood and affect, speech fluent and appropriate, AOx3 . Neurologic:  CN 2-12 grossly intact, moves all  extremities in coordinated fashion, sensation intact  Labs on Admission: I have personally reviewed following labs and imaging studies  CBC: Recent Labs  Lab 01/12/20 1430  WBC 9.0  HGB 13.3  HCT 39.7  MCV 96.8  PLT [id number]   Basic Metabolic Panel: Recent Labs  Lab 01/12/20 1430  NA 134*  K 3.7  CL 101  CO2 21*  GLUCOSE 128*  BUN 14  CREATININE 1.05*  CALCIUM 9.5   GFR: Estimated Creatinine Clearance: 38.6 mL/min (A) (by C-G formula based on SCr of 1.05 mg/dL (H)). Liver Function Tests: Recent Labs  Lab 01/12/20 1430  AST 29  ALT 20  ALKPHOS 93  BILITOT 0.6  PROT 7.7  ALBUMIN 3.3*   No results for input(s): LIPASE, AMYLASE in the last 168 hours. No results for input(s): AMMONIA in the last 168 hours. Coagulation Profile: No results for input(s): INR, PROTIME in the last 168 hours. Cardiac Enzymes: No results for input(s): CKTOTAL, CKMB, CKMBINDEX, TROPONINI in the last 168 hours. BNP (last 3 results) No results for input(s): PROBNP in the last 8760 hours. HbA1C: No results for input(s): HGBA1C in the last 72 hours. CBG: No results for input(s): GLUCAP in the last 168 hours. Lipid Profile: Recent Labs    01/12/20 1430  TRIG 121   Thyroid Function Tests: No results for input(s): TSH, T4TOTAL, FREET4, T3FREE, THYROIDAB in the last 72 hours. Anemia Panel: Recent Labs    01/12/20 1430  FERRITIN 290   Urine analysis: No results found for: COLORURINE, APPEARANCEUR, LABSPEC, PHURINE, GLUCOSEU, HGBUR, BILIRUBINUR, KETONESUR, PROTEINUR, UROBILINOGEN, NITRITE, LEUKOCYTESUR  Creatinine Clearance: Estimated Creatinine Clearance: 38.6 mL/min (A) (by C-G formula based on SCr of 1.05 mg/dL (H)).  Sepsis Labs: @LABRCNTIP (procalcitonin:4,lacticidven:4) ) Recent Results (from the past 240 hour(s))  Resp Panel by RT-PCR (  Flu A&B, Covid) Nasopharyngeal Swab     Status: None   Collection Time: 01/12/20  2:35 PM   Specimen: Nasopharyngeal Swab; Nasopharyngeal(NP)  swabs in vial transport medium  Result Value Ref Range Status   SARS Coronavirus 2 by RT PCR NEGATIVE NEGATIVE Final    Comment: (NOTE) SARS-CoV-2 target nucleic acids are NOT DETECTED.  The SARS-CoV-2 RNA is generally detectable in upper respiratory specimens during the acute phase of infection. The lowest concentration of SARS-CoV-2 viral copies this assay can detect is 138 copies/mL. A negative result does not preclude SARS-Cov-2 infection and should not be used as the sole basis for treatment or other patient management decisions. A negative result may occur with  improper specimen collection/handling, submission of specimen other than nasopharyngeal swab, presence of viral mutation(s) within the areas targeted by this assay, and inadequate number of viral copies(<138 copies/mL). A negative result must be combined with clinical observations, patient history, and epidemiological information. The expected result is Negative.  Fact Sheet for Patients:  EntrepreneurPulse.com.au  Fact Sheet for Healthcare Providers:  IncredibleEmployment.be  This test is no t yet approved or cleared by the [place] FDA and  has been authorized for detection and/or diagnosis of SARS-CoV-2 by FDA under an Emergency Use Authorization (EUA). This EUA will remain  in effect (meaning this test can be used) for the duration of the COVID-19 declaration under Section 564(b)(1) of the Act, 21 U.S.C.section 360bbb-3(b)(1), unless the authorization is terminated  or revoked sooner.       Influenza A by PCR NEGATIVE NEGATIVE Final   Influenza B by PCR NEGATIVE NEGATIVE Final    Comment: (NOTE) The Xpert Xpress SARS-CoV-2/FLU/RSV plus assay is intended as an aid in the diagnosis of influenza from Nasopharyngeal swab specimens and should not be used as a sole basis for treatment. Nasal washings and aspirates are unacceptable for Xpert Xpress  SARS-CoV-2/FLU/RSV testing.  Fact Sheet for Patients: EntrepreneurPulse.com.au  Fact Sheet for Healthcare Providers: IncredibleEmployment.be  This test is not yet approved or cleared by the [place] FDA and has been authorized for detection and/or diagnosis of SARS-CoV-2 by FDA under an Emergency Use Authorization (EUA). This EUA will remain in effect (meaning this test can be used) for the duration of the COVID-19 declaration under Section 564(b)(1) of the Act, 21 U.S.C. section 360bbb-3(b)(1), unless the authorization is terminated or revoked.  Performed at [facility], [place] [address] [place], [place] [address]      Radiological Exams on Admission: DG Chest 2 View  Result Date: 01/12/2020 CLINICAL DATA:  Cough EXAM: CHEST - 2 VIEW COMPARISON:  01/10/2020 FINDINGS: Interstitial prominence and peribronchial thickening. Right basilar airspace opacity. No confluent opacity on the left. No effusions. Heart is normal size. No acute bony abnormality. IMPRESSION: Bronchitic changes.  Right base atelectasis or infiltrate. Electronically Signed   By: Rolm Baptise M.D.   On: 01/12/2020 12:23    EKG sinus tach Assessment/Plan Active Problems:   SOB (shortness of breath)    #1 Acute hypoxic respiratory failure secondary to COPD exacerbation and CAP-patient was hypoxic to low 80s in the ER.  She does not have oxygen prior to admission to hospital. When I saw her she was in respiratory distress.  Stat ABG ordered.  Xopenex ordered.  Patient being placed on BiPAP. Covid negative respiratory virus panel negative flu negative Patient followed by Dr. Chase Caller. PCCM consulted.  #2 elevated D-dimer Lovenox 1 mg/kg.  CT chest to PCCM/Doppler of the lower extremities  #  3 tobacco abuse nicotine patch.  #4 history of essential hypertension we will have her on as needed hydralazine. Estimated body mass index is 26.09 kg/m as  calculated from the following:   Height as of 01/10/20: 4' 10.5" (1.486 m).   Weight as of 01/10/20: 57.6 kg.   DVT prophylaxis: Lovenox  code Status: Full code Family Communication: None at bedside husband on the way  disposition Plan: Pending clinical improvement Consults called: None Admission status: Inpatient   Georgette Shell MD 01/12/2020, 4:52 PM

## 2020-01-12 NOTE — ED Notes (Signed)
Dr. Jerolyn Center aware respiratory therapist is recommending pt be placed on BiPAP. Orders to follow. Respiratory therapist bedside with pt placing BiPAP.

## 2020-01-12 NOTE — Progress Notes (Signed)
PHARMACY NOTE -  Levaquin, Lovenox  Pharmacy has been assisting with dosing of Levaquin for CAP.  Dosage remains stable at 750 mg IV q48 hr and need for further dosage adjustment appears unlikely at present given baseline SCr  Pharmacy has been assisting with dosing of Lovenox for r/o VTE.  Dosage remains stable at 1 mg/kg SQ q12 hr and need for further dosage adjustment appears unlikely at present given baseline SCr  Pharmacy will sign off, following peripherally for culture results or dose adjustments. Please reconsult if a change in clinical status warrants re-evaluation of dosage.  Bernadene Person, PharmD, BCPS [phone] 01/12/2020, 5:49 PM

## 2020-01-12 NOTE — ED Notes (Signed)
Vascular bedside

## 2020-01-12 NOTE — ED Provider Notes (Signed)
[facility] EMERGENCY DEPARTMENT Provider Note  CSN: [id number] Arrival date & time: 01/12/20 1131    History Chief Complaint  Patient presents with  . Cough    HPI  Kimberly [name] is a 69 y.o. female with history of COPD, does not usually wear oxygen has had cough, fever, SOB worsening for the last several days. Advised by PCP to come to the ED for evaluation. She went to [facility] 2 days ago, but was unable to stay due to long waits. Did not get checked for Covid then. She was noted to be hypoxic on arrival and placed on 2L with good improvement.    Past Medical History:  Diagnosis Date  . Anxiety   . Arthritis    osteoarthritis  . Bronchitis   . Cataract   . Complication of anesthesia    s/p bladder retention-"only time it happened"  . COPD (chronic obstructive pulmonary disease) ([place])   . Depression   . Generalized headaches   . Heart murmur    heard occ  . Hiatal hernia    small- no problems  . History of blood in urine    microscopic -no problems identified," occ." high protein in urine"  . History of goiter   . Hyperlipemia   . Hypertension   . Osteoporosis   . Sigmoid diverticulosis    pt unaware  . Subcutaneous mass    RIGHT FKANK AND LEFT LATERAL CHEST WALL  . Wears glasses   . Wears glasses   . Wears partial dentures    Upper and lower    Past Surgical History:  Procedure Laterality Date  . BREAST EXCISIONAL BIOPSY Right pt unsure   fibrocystic tissue  . BREAST EXCISIONAL BIOPSY Left pt unsure   scarring  . BREAST SURGERY  [id number]   x3 fibrocystic disease, radial scarring  . BUNIONECTOMY Right   . CATARACT EXTRACTION, BILATERAL    . COLONOSCOPY  2011  . ESOPHAGOGASTRODUODENOSCOPY  2008  . HAMMER TOE SURGERY Right    4,5  . HAND SURGERY Left    x 2 digits"repair fron cuts of tendons" "caught in door"  . KNEE ARTHROSCOPY Left    torn cartilage  . LESION REMOVAL N/A 09/28/2013   Procedure: REMOVAL OF POSTERIOR NECK MASS;  Surgeon: Michael Boston, MD;  Location: [facility];  Service: General;  Laterality: N/A;  . LIPOMA EXCISION  [id number]   x4 -1 abd, 1 rt.. arm, 1 bil. thigh  . MASS EXCISION N/A 07/26/2018   Procedure: REMOVAL OF RIGHT FLANK , LEFT LATERAL CHEST WALL AND RIGHT THIGH SUBCUTANEOUS MASSES;  Surgeon: Michael Boston, MD;  Location: [facility];  Service: General;  Laterality: N/A;  . PARTIAL HYSTERECTOMY  1984   abnormal bleeding  . PARTIAL KNEE ARTHROPLASTY Left   . THYROID LOBECTOMY Left 1970's  . UPPER GASTROINTESTINAL ENDOSCOPY      Family History  Problem Relation Age of Onset  . Heart disease Father   . Heart disease Sister        cardiac arrest  . Lymphoma Brother   . Colon cancer Paternal Grandmother   . Esophageal cancer Neg Hx   . Rectal cancer Neg Hx   . Stomach cancer Neg Hx     Social History   Tobacco Use  . Smoking status: Current Every Day Smoker    Packs/day: 1.00    Years: 45.00    Pack years: 45.00    Types: Cigarettes  . Smokeless tobacco: Never  Used  . Tobacco comment: smoking between 0.5-1ppd  Vaping Use  . Vaping Use: Never used  Substance Use Topics  . Alcohol use: Yes    Comment: socially, once in a while  . Drug use: No     Home Medications Prior to Admission medications   Medication Sig Start Date End Date Taking? Authorizing Provider  acetaminophen (TYLENOL) 500 MG tablet Take 1,000 mg by mouth every 6 (six) hours as needed for moderate pain or headache.    Yes [provider]  albuterol (VENTOLIN HFA) 108 (90 Base) MCG/ACT inhaler Inhale 2 puffs into the lungs every 4 (four) hours as needed for wheezing or shortness of breath.   Yes [provider]  Calcium Carb-Cholecalciferol (CALCIUM 600+D3 PO) Take 1 tablet by mouth daily.   Yes [provider]  Cholecalciferol (VITAMIN D) 1000 UNITS capsule Take 1,000 Units by mouth every morning.   Yes [provider]  Coenzyme Q10 (COQ10) 100 MG CAPS Take 100 mg by mouth  daily.   Yes [provider]  denosumab (PROLIA) 60 MG/ML SOLN injection Inject 60 mg into the skin every 6 (six) months. Administer in upper arm, thigh, or abdomen   Yes [provider]  lisinopril (PRINIVIL,ZESTRIL) 20 MG tablet Take 20 mg by mouth every morning.    Yes [provider]  LIVALO 2 MG TABS Take 1 tablet by mouth daily.  04/30/19  Yes [provider]  Magnesium 250 MG TABS Take 250 mg by mouth at bedtime.   Yes [provider]  naproxen sodium (ALEVE) 220 MG tablet Take 220 mg by mouth as needed (For pain).   Yes [provider]  Tiotropium Bromide-Olodaterol (STIOLTO RESPIMAT) 2.5-2.5 MCG/ACT AERS Inhale 2 puffs into the lungs daily. 10/03/19  Yes Parrett, Tammy S, NP  venlafaxine XR (EFFEXOR-XR) 75 MG 24 hr capsule Take 75 mg by mouth daily. 05/30/18  Yes [provider]  Tiotropium Bromide Monohydrate (SPIRIVA RESPIMAT) 2.5 MCG/ACT AERS Inhale 2 puffs into the lungs daily. Patient not taking: No sig reported 08/16/19   Parrett, Virgel Bouquet, NP     Allergies    Crestor [rosuvastatin], Oxycodone, Fosamax [alendronate], Penicillins, Statins, and Sulfonamide derivatives   Review of Systems   Review of Systems A comprehensive review of systems was completed and negative except as noted in HPI.    Physical Exam BP 133/60   Pulse (!) 125   Temp 99.9 F (37.7 C) (Oral)   Resp (!) 30   SpO2 95%   Physical Exam Vitals and nursing note reviewed.  Constitutional:      Appearance: Normal appearance.  HENT:     Head: Normocephalic and atraumatic.     Nose: Nose normal.     Mouth/Throat:     Mouth: Mucous membranes are moist.  Eyes:     Extraocular Movements: Extraocular movements intact.     Conjunctiva/sclera: Conjunctivae normal.  Cardiovascular:     Rate and Rhythm: Normal rate.  Pulmonary:     Effort: Pulmonary effort is normal.     Breath sounds: Wheezing and rhonchi present.     Comments:  tachypnea Abdominal:     General: Abdomen is flat.     Palpations: Abdomen is soft.     Tenderness: There is no abdominal tenderness.  Musculoskeletal:        General: No swelling. Normal range of motion.     Cervical back: Neck supple.  Skin:    General: Skin is warm and  dry.  Neurological:     General: No focal deficit present.     Mental Status: She is alert.  Psychiatric:        Mood and Affect: Mood normal.      ED Results / Procedures / Treatments   Labs (all labs ordered are listed, but only abnormal results are displayed) Labs Reviewed  COMPREHENSIVE METABOLIC PANEL - Abnormal; Notable for the following components:      Result Value   Sodium 134 (*)    CO2 21 (*)    Glucose, Bld 128 (*)    Creatinine, Ser 1.05 (*)    Albumin 3.3 (*)    GFR, Estimated 58 (*)    All other components within normal limits  D-DIMER, QUANTITATIVE (NOT AT [facility]) - Abnormal; Notable for the following components:   D-Dimer, Quant 2.04 (*)    All other components within normal limits  FIBRINOGEN - Abnormal; Notable for the following components:   Fibrinogen >800 (*)    All other components within normal limits  C-REACTIVE PROTEIN - Abnormal; Notable for the following components:   CRP 43.8 (*)    All other components within normal limits  RESP PANEL BY RT-PCR (FLU A&B, COVID) [id number]  CULTURE, BLOOD (ROUTINE X 2)  CULTURE, BLOOD (ROUTINE X 2)  CBC  LACTIC ACID, PLASMA  PROCALCITONIN  LACTATE DEHYDROGENASE  FERRITIN  TRIGLYCERIDES  LACTIC ACID, PLASMA    EKG EKG Interpretation  Date/Time:  Friday January 12 2020 14:41:47 EST Ventricular Rate:  139 PR Interval:    QRS Duration: 59 QT Interval:  304 QTC Calculation: 463 R Axis:   74 Text Interpretation: Sinus tachycardia Atrial premature complex Minimal ST depression, inferior leads No significant change since last tracing Confirmed by Calvert Cantor [phone]) on 01/12/2020 2:46:16 PM   Radiology DG Chest 2 View  Result  Date: 01/12/2020 CLINICAL DATA:  Cough EXAM: CHEST - 2 VIEW COMPARISON:  01/10/2020 FINDINGS: Interstitial prominence and peribronchial thickening. Right basilar airspace opacity. No confluent opacity on the left. No effusions. Heart is normal size. No acute bony abnormality. IMPRESSION: Bronchitic changes.  Right base atelectasis or infiltrate. Electronically Signed   By: Rolm Baptise M.D.   On: 01/12/2020 12:23   DG Chest Portable 1 View  Result Date: 01/10/2020 CLINICAL DATA:  Cough and fever.  COVID exposure. EXAM: PORTABLE CHEST 1 VIEW COMPARISON:  Radiograph 06/09/2019. FINDINGS: The cardiomediastinal contours are normal. Mild bronchial thickening. Pulmonary vasculature is normal. No consolidation, pleural effusion, or pneumothorax. No acute osseous abnormalities are seen. IMPRESSION: Mild bronchial thickening. No consolidation to suggest pneumonia. Electronically Signed   By: Keith Rake M.D.   On: 01/10/2020 16:42    Procedures Procedures  Medications Ordered in the ED Medications  albuterol (VENTOLIN HFA) 108 (90 Base) MCG/ACT inhaler (  Not Given 01/12/20 1411)  cefTRIAXone (ROCEPHIN) 1 g in sodium chloride 0.9 % 100 mL IVPB (1 g Intravenous New Bag/Given 01/12/20 1619)  azithromycin (ZITHROMAX) 500 mg in sodium chloride 0.9 % 250 mL IVPB (has no administration in time range)  albuterol (VENTOLIN HFA) 108 (90 Base) MCG/ACT inhaler 2 puff (2 puffs Inhalation Given 01/12/20 1411)  methylPREDNISolone sodium succinate (SOLU-MEDROL) 125 mg/2 mL injection 125 mg (125 mg Intravenous Given 01/12/20 1620)  lactated ringers bolus 500 mL (500 mLs Intravenous New Bag/Given 01/12/20 1619)     MDM Rules/Calculators/A&P MDM Patient's CXR reviewed, possible early infiltrate on right. She is more comfortable on oxygen. Will check labs and Covid swab. Albuterol MDI  for wheezing.  ED Course  I have reviewed the triage vital signs and the nursing notes.  Pertinent labs & imaging results that  were available during my care of the patient were reviewed by me and considered in my medical decision making (see chart for details).  Clinical Course as of 01/12/20 1627  Fri Jan 12, 2020  1456 CBC normal.  [CS]  1514 Lactic acid is normal; triglyceride normal.  [CS]  1523 CMP is unremarkable.  [CS]  [id number] Dimer is mildly elevated.  [CS]  [id number] Fibrinogen elevated [CS]  1536 Procalcitonin is elevated >2.  [CS]  1602 Covid/Flu is neg. Given hypoxia and wheezing will give a neb and steroids. Also plan Abx for CAP given fever and elevated procalcitonin. Admit for further evaluation.  [CS]  [id number] Patient remains tachycardic after MDI. Will hold off on additional albuterol and give some IVF to see if that helps her HR. Not hypotensive and lactic acid normal. No concern for sepsis.  [CS]  1626 Spoke with Dr. Zigmund Daniel, Hospitalist, who will evaluate for admission.  [CS]    Clinical Course User Index [CS] Truddie Hidden, MD    Final Clinical Impression(s) / ED Diagnoses Final diagnoses:  Community acquired pneumonia of right lower lobe of lung    Rx / DC Orders ED Discharge Orders    None       Truddie Hidden, MD 01/12/20 1627

## 2020-01-12 NOTE — ED Notes (Signed)
Respiratory therapist bedside

## 2020-01-12 NOTE — Progress Notes (Signed)
Pt transported from [facility] to CT scan and then back to [facility].  Pt remained stable and comfortable while on BiPAP throughout the trip.

## 2020-01-12 NOTE — Progress Notes (Signed)
Lower extremity venous bilateral study completed.  Preliminary results relayed to Jerolyn Center, MD.  See CV Proc for preliminary results report.   Jean Rosenthal, RDMS

## 2020-01-12 NOTE — ED Notes (Signed)
Hospitalist bedside. Dr. Jerolyn Center aware pt HR is 128 and respirations 35.

## 2020-01-12 NOTE — Consult Note (Addendum)
NAME:  Kimberly [name], MRN:  [id number], DOB:  03/26/1950, LOS: 0 ADMISSION DATE:  01/12/2020, CONSULTATION DATE:  01/12/2020 REFERRING MD: Dr. Zigmund Daniel, CHIEF COMPLAINT: Acute hypoxemic respiratory failure  Brief History:  Worsening shortness of breath in the last few days Has had fever, body aches for about 4 days Grandson with cold symptoms  History of Present Illness:  History of obstructive lung disease Active smoker about a pack a day  Past Medical History:   Past Medical History:  Diagnosis Date  . Anxiety   . Arthritis    osteoarthritis  . Bronchitis   . Cataract   . Complication of anesthesia    s/p bladder retention-"only time it happened"  . COPD (chronic obstructive pulmonary disease) ([place])   . Depression   . Generalized headaches   . Heart murmur    heard occ  . Hiatal hernia    small- no problems  . History of blood in urine    microscopic -no problems identified," occ." high protein in urine"  . History of goiter   . Hyperlipemia   . Hypertension   . Osteoporosis   . Sigmoid diverticulosis    pt unaware  . Subcutaneous mass    RIGHT FKANK AND LEFT LATERAL CHEST WALL  . Wears glasses   . Wears glasses   . Wears partial dentures    Upper and lower   Significant Hospital Events:  On BiPAP-more comfortable on BiPAP  Consults:  PCCM  Procedures:  NONE  Significant Diagnostic Tests:  Chest x-ray No acute infiltrate  Micro Data:  Blood culture 01/12/2020>>  Antimicrobials:  Levaquin 12/31>>  Interim History / Subjective:  More comfortable on the BiPAP  Objective   Blood pressure 132/74, pulse (!) 122, temperature 99.9 F (37.7 C), temperature source Oral, resp. rate (!) 35, SpO2 96 %.    FiO2 (%):  [40 %] 40 %   Intake/Output Summary (Last 24 hours) at 01/12/2020 1818 Last data filed at 01/12/2020 1745 Gross per 24 hour  Intake 850 ml  Output --  Net 850 ml   There were no vitals filed for this visit.  Examination: General:  Middle-age lady does not appear to be in distress, toleratING BiPAP well HENT: Moist oral mucosa Lungs: Decreased air entry bilaterally Cardiovascular: S1-S2 appreciated Abdomen: Soft, bowel sounds appreciated Extremities: No clubbing, no edema Neuro: Alert and oriented x3 GU:   Respiratory viral panel negative Coronavirus negative Influenza negative  ABG noted:7.42/32/166 Pulmonary function test from 09/13/2019 reviewed  Resolved Hospital Problem list     Assessment & Plan:  Acute hypoxemic respiratory failure -Likely related to decompensated chronic obstructive pulmonary disease -Possible pneumonia -Continue BiPAP -Continue bronchodilators -Continue steroids -Received azithromycin and Rocephin in the ED Ordered Levaquin to start 01/13/2020  Pneumonia -Continue current antibiotic therapy  Chronic obstructive pulmonary disease with PFT revealing severe obstructive disease -Was recently seen by pulmonary -On Stiolto and albuterol -Recent CT scan of the chest did reveal findings suggestive of respiratory bronchiolitis interstitial lung disease -Importance of quitting smoking was stressed  History of depression History of hyperlipidemia History of hypertension  Keep patient on BiPAP at the present time as it is helping patient's work of breathing   Agree with a CT PE angio to rule out pulmonary embolism -COPD patients are prone to thromboembolic disease -Elevated D-dimer   Best practice (evaluated daily)  Diet: N.p.o. at present Pain/Anxiety/Delirium protocol (if indicated): Not indicated VAP protocol (if indicated): Not indicated DVT prophylaxis: Lovenox GI prophylaxis:  Not indicated Mobility: Bedrest Disposition: ICU   Labs   CBC: Recent Labs  Lab 01/12/20 1430  WBC 9.0  HGB 13.3  HCT 39.7  MCV 96.8  PLT 274    Basic Metabolic Panel: Recent Labs  Lab 01/12/20 1430  NA 134*  K 3.7  CL 101  CO2 21*  GLUCOSE 128*  BUN 14  CREATININE 1.05*   CALCIUM 9.5   GFR: Estimated Creatinine Clearance: 38.6 mL/min (A) (by C-G formula based on SCr of 1.05 mg/dL (H)). Recent Labs  Lab 01/12/20 1430  PROCALCITON 4.27  WBC 9.0  LATICACIDVEN 1.3    Liver Function Tests: Recent Labs  Lab 01/12/20 1430  AST 29  ALT 20  ALKPHOS 93  BILITOT 0.6  PROT 7.7  ALBUMIN 3.3*   No results for input(s): LIPASE, AMYLASE in the last 168 hours. No results for input(s): AMMONIA in the last 168 hours.  ABG    Component Value Date/Time   PHART 7.429 01/12/2020 1644   PCO2ART 31.6 (L) 01/12/2020 1644   PO2ART 166 (H) 01/12/2020 1644   HCO3 20.5 01/12/2020 1644   TCO2 29 07/26/2018 1112   ACIDBASEDEF 2.4 (H) 01/12/2020 1644   O2SAT 99.2 01/12/2020 1644     Coagulation Profile: No results for input(s): INR, PROTIME in the last 168 hours.  Cardiac Enzymes: No results for input(s): CKTOTAL, CKMB, CKMBINDEX, TROPONINI in the last 168 hours.  HbA1C: No results found for: HGBA1C  CBG: No results for input(s): GLUCAP in the last 168 hours.  Review of Systems:   More comfortable shortness of breath  Past Medical History:  She,  has a past medical history of Anxiety, Arthritis, Bronchitis, Cataract, Complication of anesthesia, COPD (chronic obstructive pulmonary disease) ([facility]), Depression, Generalized headaches, Heart murmur, Hiatal hernia, History of blood in urine, History of goiter, Hyperlipemia, Hypertension, Osteoporosis, Sigmoid diverticulosis, Subcutaneous mass, Wears glasses, Wears glasses, and Wears partial dentures.   Surgical History:   Past Surgical History:  Procedure Laterality Date  . BREAST EXCISIONAL BIOPSY Right pt unsure   fibrocystic tissue  . BREAST EXCISIONAL BIOPSY Left pt unsure   scarring  . BREAST SURGERY  [id number]   x3 fibrocystic disease, radial scarring  . BUNIONECTOMY Right   . CATARACT EXTRACTION, BILATERAL    . COLONOSCOPY  2011  . ESOPHAGOGASTRODUODENOSCOPY  2008  . HAMMER TOE SURGERY Right     4,5  . HAND SURGERY Left    x 2 digits"repair fron cuts of tendons" "caught in door"  . KNEE ARTHROSCOPY Left    torn cartilage  . LESION REMOVAL N/A 09/28/2013   Procedure: REMOVAL OF POSTERIOR NECK MASS;  Surgeon: Karie Soda, MD;  Location: [facility];  Service: General;  Laterality: N/A;  . LIPOMA EXCISION  [id number]   x4 -1 abd, 1 rt.. arm, 1 bil. thigh  . MASS EXCISION N/A 07/26/2018   Procedure: REMOVAL OF RIGHT FLANK , LEFT LATERAL CHEST WALL AND RIGHT THIGH SUBCUTANEOUS MASSES;  Surgeon: Karie Soda, MD;  Location: [facility];  Service: General;  Laterality: N/A;  . PARTIAL HYSTERECTOMY  1984   abnormal bleeding  . PARTIAL KNEE ARTHROPLASTY Left   . THYROID LOBECTOMY Left 1970's  . UPPER GASTROINTESTINAL ENDOSCOPY       Social History:   reports that she has been smoking cigarettes. She has a 45.00 pack-year smoking history. She has never used smokeless tobacco. She reports current alcohol use. She reports that she does not use drugs.   Family History:  Her family history includes Colon cancer in her paternal grandmother; Heart disease in her father and sister; Lymphoma in her brother. There is no history of Esophageal cancer, Rectal cancer, or Stomach cancer.   Allergies Allergies  Allergen Reactions  . Crestor [Rosuvastatin]     Unknown  . Oxycodone Itching  . Fosamax [Alendronate]     Muscles spasms and cramps  . Penicillins Itching, Rash and Other (See Comments)    Did it involve swelling of the face/tongue/throat, SOB, or low BP? No Did it involve sudden or severe rash/hives, skin peeling, or any reaction on the inside of your mouth or nose? Yes Did you need to seek medical attention at a hospital or doctor's office? Yes When did it last happen? Long time ago If all above answers are "NO", may proceed with cephalosporin use.   . Statins     Muscle spasms and cramps  . Sulfonamide Derivatives Rash    Risk of decompensation remains significant May  decompensate to require ventilator support  The patient is critically ill with multiple organ systems failure and requires high complexity decision making for assessment and support, frequent evaluation and titration of therapies, application of advanced monitoring technologies and extensive interpretation of multiple databases. Critical Care Time devoted to patient care services described in this note independent of APP/resident time (if applicable)  is 30 minutes.   Sherrilyn Rist MD [facility] Personal pager: [phone] If unanswered, please page CCM On-call: [phone]

## 2020-01-12 NOTE — ED Triage Notes (Signed)
Per pt, states her PCP told her to come to ED for cough, chills, wheezing-states symptoms since 12/26-was seen for same on the 12/29-not getting any better-placed on 2L O2 due to SATS in 80's

## 2020-01-13 DIAGNOSIS — E785 Hyperlipidemia, unspecified: Secondary | ICD-10-CM | POA: Diagnosis present

## 2020-01-13 DIAGNOSIS — J9601 Acute respiratory failure with hypoxia: Secondary | ICD-10-CM | POA: Diagnosis present

## 2020-01-13 DIAGNOSIS — J441 Chronic obstructive pulmonary disease with (acute) exacerbation: Secondary | ICD-10-CM

## 2020-01-13 DIAGNOSIS — Z720 Tobacco use: Secondary | ICD-10-CM

## 2020-01-13 DIAGNOSIS — R7989 Other specified abnormal findings of blood chemistry: Secondary | ICD-10-CM

## 2020-01-13 DIAGNOSIS — J189 Pneumonia, unspecified organism: Secondary | ICD-10-CM | POA: Diagnosis present

## 2020-01-13 DIAGNOSIS — F32A Depression, unspecified: Secondary | ICD-10-CM | POA: Diagnosis present

## 2020-01-13 DIAGNOSIS — I10 Essential (primary) hypertension: Secondary | ICD-10-CM

## 2020-01-13 LAB — CBC
HCT: 39.2 % (ref 36.0–46.0)
Hemoglobin: 12.7 g/dL (ref 12.0–15.0)
MCH: 31.9 pg (ref 26.0–34.0)
MCHC: 32.4 g/dL (ref 30.0–36.0)
MCV: 98.5 fL (ref 80.0–100.0)
Platelets: 263 10*3/uL (ref 150–400)
RBC: 3.98 MIL/uL (ref 3.87–5.11)
RDW: 13.8 % (ref 11.5–15.5)
WBC: 7.5 10*3/uL (ref 4.0–10.5)
nRBC: 0 % (ref 0.0–0.2)

## 2020-01-13 LAB — COMPREHENSIVE METABOLIC PANEL
ALT: 24 U/L (ref 0–44)
AST: 27 U/L (ref 15–41)
Albumin: 3 g/dL — ABNORMAL LOW (ref 3.5–5.0)
Alkaline Phosphatase: 96 U/L (ref 38–126)
Anion gap: 14 (ref 5–15)
BUN: 24 mg/dL — ABNORMAL HIGH (ref 8–23)
CO2: 23 mmol/L (ref 22–32)
Calcium: 9.6 mg/dL (ref 8.9–10.3)
Chloride: 103 mmol/L (ref 98–111)
Creatinine, Ser: 1.17 mg/dL — ABNORMAL HIGH (ref 0.44–1.00)
GFR, Estimated: 51 mL/min — ABNORMAL LOW (ref >60)
Glucose, Bld: 247 mg/dL — ABNORMAL HIGH (ref 70–99)
Potassium: 4 mmol/L (ref 3.5–5.1)
Sodium: 140 mmol/L (ref 135–145)
Total Bilirubin: 0.4 mg/dL (ref 0.3–1.2)
Total Protein: 7.6 g/dL (ref 6.5–8.1)

## 2020-01-13 LAB — MRSA PCR SCREENING: MRSA by PCR: NEGATIVE

## 2020-01-13 LAB — GLUCOSE, CAPILLARY: Glucose-Capillary: 243 mg/dL — ABNORMAL HIGH (ref 70–99)

## 2020-01-13 LAB — C-REACTIVE PROTEIN: CRP: 46.6 mg/dL — ABNORMAL HIGH (ref <1.0)

## 2020-01-13 LAB — CBG MONITORING, ED: Glucose-Capillary: 193 mg/dL — ABNORMAL HIGH (ref 70–99)

## 2020-01-13 LAB — D-DIMER, QUANTITATIVE: D-Dimer, Quant: 1.66 ug/mL-FEU — ABNORMAL HIGH (ref 0.00–0.50)

## 2020-01-13 MED ORDER — INSULIN ASPART 100 UNIT/ML ~~LOC~~ SOLN
0.0000 [IU] | Freq: Three times a day (TID) | SUBCUTANEOUS | Status: DC
  Administered 2020-01-13: 4 [IU] via SUBCUTANEOUS
  Administered 2020-01-13: 3 [IU] via SUBCUTANEOUS
  Administered 2020-01-14 (×2): 4 [IU] via SUBCUTANEOUS
  Administered 2020-01-15: 3 [IU] via SUBCUTANEOUS
  Filled 2020-01-13: qty 0.2

## 2020-01-13 MED ORDER — CHLORHEXIDINE GLUCONATE CLOTH 2 % EX PADS
6.0000 | MEDICATED_PAD | Freq: Every day | CUTANEOUS | Status: DC
  Administered 2020-01-13 – 2020-01-15 (×2): 6 via TOPICAL

## 2020-01-13 MED ORDER — IPRATROPIUM BROMIDE 0.02 % IN SOLN: 0.5000 mg | Freq: Four times a day (QID) | RESPIRATORY_TRACT | Status: DC

## 2020-01-13 MED ORDER — ENOXAPARIN SODIUM 40 MG/0.4ML ~~LOC~~ SOLN
40.0000 mg | SUBCUTANEOUS | Status: DC
  Administered 2020-01-13 – 2020-01-15 (×3): 40 mg via SUBCUTANEOUS
  Filled 2020-01-13 (×3): qty 0.4

## 2020-01-13 MED ORDER — FLUTICASONE PROPIONATE 50 MCG/ACT NA SUSP
2.0000 | Freq: Every day | NASAL | Status: DC
  Administered 2020-01-14 – 2020-01-15 (×2): 2 via NASAL
  Filled 2020-01-13: qty 16

## 2020-01-13 MED ORDER — CHLORHEXIDINE GLUCONATE 0.12 % MT SOLN
15.0000 mL | Freq: Two times a day (BID) | OROMUCOSAL | Status: DC
  Administered 2020-01-13 – 2020-01-15 (×4): 15 mL via OROMUCOSAL
  Filled 2020-01-13 (×4): qty 15

## 2020-01-13 MED ORDER — ORAL CARE MOUTH RINSE
15.0000 mL | Freq: Two times a day (BID) | OROMUCOSAL | Status: DC
  Administered 2020-01-14 (×2): 15 mL via OROMUCOSAL

## 2020-01-13 MED ORDER — BUDESONIDE 0.5 MG/2ML IN SUSP: 0.5000 mg | Freq: Two times a day (BID) | RESPIRATORY_TRACT | Status: DC

## 2020-01-13 MED ORDER — METHYLPREDNISOLONE SODIUM SUCC 125 MG IJ SOLR
60.0000 mg | Freq: Three times a day (TID) | INTRAMUSCULAR | Status: DC
  Administered 2020-01-14 (×2): 60 mg via INTRAVENOUS
  Filled 2020-01-13 (×2): qty 2

## 2020-01-13 MED ORDER — LORATADINE 10 MG PO TABS
10.0000 mg | ORAL_TABLET | Freq: Every day | ORAL | Status: DC
  Administered 2020-01-13 – 2020-01-15 (×3): 10 mg via ORAL
  Filled 2020-01-13 (×3): qty 1

## 2020-01-13 MED ORDER — AMLODIPINE BESYLATE 5 MG PO TABS
5.0000 mg | ORAL_TABLET | Freq: Every day | ORAL | Status: DC
  Administered 2020-01-13: 5 mg via ORAL
  Filled 2020-01-13: qty 1

## 2020-01-13 MED ORDER — PANTOPRAZOLE SODIUM 40 MG PO TBEC
40.0000 mg | DELAYED_RELEASE_TABLET | Freq: Every day | ORAL | Status: DC
  Administered 2020-01-13 – 2020-01-15 (×3): 40 mg via ORAL
  Filled 2020-01-13 (×3): qty 1

## 2020-01-13 MED ORDER — IPRATROPIUM BROMIDE 0.02 % IN SOLN
0.5000 mg | Freq: Four times a day (QID) | RESPIRATORY_TRACT | Status: DC
  Administered 2020-01-13 – 2020-01-15 (×8): 0.5 mg via RESPIRATORY_TRACT
  Filled 2020-01-13 (×8): qty 2.5

## 2020-01-13 MED ORDER — VENLAFAXINE HCL ER 75 MG PO CP24
75.0000 mg | ORAL_CAPSULE | Freq: Every day | ORAL | Status: DC
  Administered 2020-01-13 – 2020-01-15 (×3): 75 mg via ORAL
  Filled 2020-01-13 (×3): qty 1

## 2020-01-13 MED ORDER — BUDESONIDE 0.5 MG/2ML IN SUSP
0.5000 mg | Freq: Two times a day (BID) | RESPIRATORY_TRACT | Status: DC
  Administered 2020-01-13 – 2020-01-15 (×4): 0.5 mg via RESPIRATORY_TRACT
  Filled 2020-01-13 (×4): qty 2

## 2020-01-13 MED ORDER — GUAIFENESIN ER 600 MG PO TB12
1200.0000 mg | ORAL_TABLET | Freq: Two times a day (BID) | ORAL | Status: DC
  Administered 2020-01-13 – 2020-01-15 (×5): 1200 mg via ORAL
  Filled 2020-01-13 (×6): qty 2

## 2020-01-13 MED ORDER — VITAMIN D 25 MCG (1000 UNIT) PO TABS
1000.0000 [IU] | ORAL_TABLET | Freq: Every morning | ORAL | Status: DC
  Administered 2020-01-14 – 2020-01-15 (×2): 1000 [IU] via ORAL
  Filled 2020-01-13 (×2): qty 1

## 2020-01-13 MED ORDER — SODIUM CHLORIDE 0.9 % IV SOLN: INTRAVENOUS | Status: DC

## 2020-01-13 NOTE — Progress Notes (Signed)
NAME:  Kimberly [name], MRN:  [id number], DOB:  01-30-1950, LOS: 1 ADMISSION DATE:  01/12/2020, CONSULTATION DATE: 01/12/2020 REFERRING MD: Dr. Zigmund Daniel, CHIEF COMPLAINT: Acute hypoxemic respiratory failure  Brief History:  Worsening shortness of breath in the last few days Has had fever, body aches for about 4 days Grandson with cold symptoms History of Present Illness:  History of obstructive lung disease Active smoker about a pack a day Past Medical History:   Past Medical History:  Diagnosis Date  . Anxiety   . Arthritis    osteoarthritis  . Bronchitis   . Cataract   . Complication of anesthesia    s/p bladder retention-"only time it happened"  . COPD (chronic obstructive pulmonary disease) ([place])   . Depression   . Generalized headaches   . Heart murmur    heard occ  . Hiatal hernia    small- no problems  . History of blood in urine    microscopic -no problems identified," occ." high protein in urine"  . History of goiter   . Hyperlipemia   . Hypertension   . Osteoporosis   . Sigmoid diverticulosis    pt unaware  . Subcutaneous mass    RIGHT FKANK AND LEFT LATERAL CHEST WALL  . Wears glasses   . Wears glasses   . Wears partial dentures    Upper and lower    Significant Hospital Events:  Was able to come off BiPAP today 01/13/2020 Consults:  pccm Procedures:  none  Significant Diagnostic Tests:  Chest x-ray-no acute infiltrate  Micro Data:  Blood culture 01/12/2020-negative to date  Antimicrobials:  Levaquin  Interim History / Subjective:  Feels a little bit better, comfortable on 3 L  Objective   Blood pressure (!) 160/78, pulse (!) 112, temperature 99.9 F (37.7 C), temperature source Oral, resp. rate (!) 25, SpO2 95 %.    FiO2 (%):  [30 %-40 %] 30 %   Intake/Output Summary (Last 24 hours) at 01/13/2020 1407 Last data filed at 01/12/2020 1745 Gross per 24 hour  Intake 850 ml  Output --  Net 850 ml   There were no vitals filed for this  visit.  Examination: General: Middle-age lady, does not appear to be in distress, looks comfortable on 3 L of oxygen HENT: Moist oral mucosa Lungs: Decreased air movement with mild wheezing Cardiovascular: S1-S2 appreciated Abdomen: Soft, bowel sounds appreciated Extremities: No clubbing, no edema Neuro: Alert and oriented x3 GU:   Resolved Hospital Problem list     Assessment & Plan:  Acute hypoxemic respiratory failure Likely related to decompensated COPD Possible pneumonia Continue bronchodilators, steroids, complete course of antibiotics  Pneumonia -Continue antibiotic therapy  PFT in the outpatient setting did reveal severe obstructive disease -On Stiolto and albuterol in the outpatient setting -Previous CT had revealed evidence of RB ILD -Importance of quitting smoking stressed to the patient  History of depression History of hyperlipidemia History of hypertension  CT chest negative for pulmonary embolism  Appears to be better at present  Best practice (evaluated daily)  Diet: Regular diet Pain/Anxiety/Delirium protocol (if indicated): Not indicated VAP protocol (if indicated): Not indicated DVT prophylaxis: Lovenox GI prophylaxis: Not indicated Glucose control: Not indicated Mobility: As tolerated Disposition: Stepdown   Labs   CBC: Recent Labs  Lab 01/12/20 1430 01/13/20 0407  WBC 9.0 7.5  HGB 13.3 12.7  HCT 39.7 39.2  MCV 96.8 98.5  PLT 274 [id number]    Basic Metabolic Panel: Recent Labs  Lab 01/12/20  1430 01/13/20 0407  NA 134* 140  K 3.7 4.0  CL 101 103  CO2 21* 23  GLUCOSE 128* 247*  BUN 14 24*  CREATININE 1.05* 1.17*  CALCIUM 9.5 9.6   GFR: Estimated Creatinine Clearance: 34.6 mL/min (A) (by C-G formula based on SCr of 1.17 mg/dL (H)). Recent Labs  Lab 01/12/20 1430 01/13/20 0407  PROCALCITON 4.27  --   WBC 9.0 7.5  LATICACIDVEN 1.3  --     Liver Function Tests: Recent Labs  Lab 01/12/20 1430 01/13/20 0407  AST 29 27   ALT 20 24  ALKPHOS 93 96  BILITOT 0.6 0.4  PROT 7.7 7.6  ALBUMIN 3.3* 3.0*   No results for input(s): LIPASE, AMYLASE in the last 168 hours. No results for input(s): AMMONIA in the last 168 hours.  ABG    Component Value Date/Time   PHART 7.429 01/12/2020 1644   PCO2ART 31.6 (L) 01/12/2020 1644   PO2ART 166 (H) 01/12/2020 1644   HCO3 20.5 01/12/2020 1644   TCO2 29 07/26/2018 1112   ACIDBASEDEF 2.4 (H) 01/12/2020 1644   O2SAT 99.2 01/12/2020 1644     Coagulation Profile: No results for input(s): INR, PROTIME in the last 168 hours.  Cardiac Enzymes: No results for input(s): CKTOTAL, CKMB, CKMBINDEX, TROPONINI in the last 168 hours.  HbA1C: No results found for: HGBA1C  CBG: Recent Labs  Lab 01/13/20 1144  GLUCAP 193*    Review of Systems:   Shortness of breath better  Past Medical History:  She,  has a past medical history of Anxiety, Arthritis, Bronchitis, Cataract, Complication of anesthesia, COPD (chronic obstructive pulmonary disease) ([place]), Depression, Generalized headaches, Heart murmur, Hiatal hernia, History of blood in urine, History of goiter, Hyperlipemia, Hypertension, Osteoporosis, Sigmoid diverticulosis, Subcutaneous mass, Wears glasses, Wears glasses, and Wears partial dentures.   Surgical History:   Past Surgical History:  Procedure Laterality Date  . BREAST EXCISIONAL BIOPSY Right pt unsure   fibrocystic tissue  . BREAST EXCISIONAL BIOPSY Left pt unsure   scarring  . BREAST SURGERY  [id number]   x3 fibrocystic disease, radial scarring  . BUNIONECTOMY Right   . CATARACT EXTRACTION, BILATERAL    . COLONOSCOPY  2011  . ESOPHAGOGASTRODUODENOSCOPY  2008  . HAMMER TOE SURGERY Right    4,5  . HAND SURGERY Left    x 2 digits"repair fron cuts of tendons" "caught in door"  . KNEE ARTHROSCOPY Left    torn cartilage  . LESION REMOVAL N/A 09/28/2013   Procedure: REMOVAL OF POSTERIOR NECK MASS;  Surgeon: Michael Boston, MD;  Location: [facility];  Service:  General;  Laterality: N/A;  . LIPOMA EXCISION  [id number]   x4 -1 abd, 1 rt.. arm, 1 bil. thigh  . MASS EXCISION N/A 07/26/2018   Procedure: REMOVAL OF RIGHT FLANK , LEFT LATERAL CHEST WALL AND RIGHT THIGH SUBCUTANEOUS MASSES;  Surgeon: Michael Boston, MD;  Location: [facility];  Service: General;  Laterality: N/A;  . PARTIAL HYSTERECTOMY  1984   abnormal bleeding  . PARTIAL KNEE ARTHROPLASTY Left   . THYROID LOBECTOMY Left 1970's  . UPPER GASTROINTESTINAL ENDOSCOPY       Social History:   reports that she has been smoking cigarettes. She has a 45.00 pack-year smoking history. She has never used smokeless tobacco. She reports current alcohol use. She reports that she does not use drugs.   Family History:  Her family history includes Colon cancer in her paternal grandmother; Heart disease in her father and  sister; Lymphoma in her brother. There is no history of Esophageal cancer, Rectal cancer, or Stomach cancer.   Allergies Allergies  Allergen Reactions  . Crestor [Rosuvastatin]     Unknown  . Oxycodone Itching  . Fosamax [Alendronate]     Muscles spasms and cramps  . Penicillins Itching, Rash and Other (See Comments)    Did it involve swelling of the face/tongue/throat, SOB, or low BP? No Did it involve sudden or severe rash/hives, skin peeling, or any reaction on the inside of your mouth or nose? Yes Did you need to seek medical attention at a hospital or doctor's office? Yes When did it last happen? Long time ago If all above answers are "NO", may proceed with cephalosporin use.   . Statins     Muscle spasms and cramps  . Sulfonamide Derivatives Rash    Virl Diamond, MD [facility] Pager: [phone]

## 2020-01-13 NOTE — ED Notes (Signed)
Respiratory called for Neb treatment.  °

## 2020-01-13 NOTE — Progress Notes (Signed)
Rapid Response Event Note   Rounded on pt d/t holding in ED w/ ICU level of care. Pt currently on bipap 45% w/ 100% O2. Pt A&Ox4 and in good spirits. RR RN will continue to monitor remotely.  Elliot Cousin, RN

## 2020-01-13 NOTE — ED Notes (Signed)
Pt changed, new pure wick in place and linens changed

## 2020-01-13 NOTE — Progress Notes (Signed)
RT NOTE:  RT took pt off BiPAP and placed on 3L . Pt tolerating this well at this time with saturations of 95-97%. Pt states her breathing feels better. Pt WOB normal, respirations 18-20 at this time with no paradoxical breathing pattern or accessory muscle use. Vitals stable at this time, RT will continue to monitor.

## 2020-01-13 NOTE — Progress Notes (Addendum)
PROGRESS NOTE    PORTER PLINER  [id number] DOB: 1950-06-08 DOA: 01/12/2020 PCP: Mayra Neer, MD    Chief Complaint  Patient presents with  . Cough    Brief Narrative:  Patient is a 70 year old female history of COPD not on home O2 presented to the ED with worsening shortness of breath, fever, generalized body aches x4 days.  Noted to be hypoxic with sats in the 80s on room air.  Patient presented to the ED, COVID-19 PCR negative.  Respiratory viral panel negative, flu negative.  Patient noted to be tachypneic, tachycardic, chest x-ray concerning for right lower lobe infiltrates, D-dimer elevated, noted to be in acute hypoxic respiratory failure secondary to CAP and COPD exacerbation.  Patient placed on Xopenex nebs, placed on BiPAP.  Lower extremity Dopplers done negative for DVT.  CT chest done negative for PE, consistent with multilobar pneumonia.  PCCM consulted.  Patient placed empirically on IV antibiotics.   Assessment & Plan:   Principal Problem:   Acute hypoxemic respiratory failure (HCC) Active Problems:   CAP (community acquired pneumonia)   COPD with acute exacerbation (HCC)   Tobacco abuse   Hyperlipidemia   Essential hypertension   Elevated d-dimer   Depression   1 acute hypoxemic respiratory failure secondary to decompensated COPD exacerbation and community-acquired pneumonia Patient presented with worsening shortness of breath, fever, body aches x4 days noted to be hypoxic with sats of 80% on room air.  Chest x-ray done on admission concerning for right lower lobe pneumonia.  Lower extremity Dopplers done negative for DVT.  CT angiogram chest done negative for PE however consistent with multilobar pneumonia. Patient seen in consultation by PCCM and is noted per PCCM's note that PFTs in outpatient setting revealed severe obstructive disease. Patient currently on Xopenex nebs, IV Solu-Medrol and IV Levaquin and also noted to be on BiPAP.  Some clinical  improvement on BiPAP.  Will place on Atrovent nebs, Pulmicort, Claritin, Flonase, Protonix.  Continue IV Levaquin.  Decrease IV Solu-Medrol to every 8 hours.  Trial of BiPAP.  PCCM following and appreciate input and recommendations.  2.  Elevated D-dimer CT angiogram chest negative for PE.  Lower extremity Dopplers negative for DVT.  Change Lovenox to prophylactic dose Lovenox.  Follow.  3.  Tobacco abuse Tobacco cessation stressed to patient.  Continue nicotine patch.  4.  Hypertension Hold ACE inhibitor.  Start Norvasc 5 mg daily.  Hydralazine as needed.  Follow.    5.  Hyperlipidemia Currently off statin secondary to myalgias.  Outpatient follow-up with PCP.   6.  Depression Continue Effexor.  7.  Hyperglycemia Likely steroid-induced.  Check a hemoglobin A1c.  Sliding scale insulin.    DVT prophylaxis: Lovenox Code Status: Full Family Communication: Updated patient and husband at bedside. Disposition:   Status is: Inpatient    Dispo: The patient is from: Home              Anticipated d/c is to: Home              Anticipated d/c date is: 3 to 4 days.              Patient currently on BiPAP, not stable for discharge.       Consultants:   PCCM: Dr. Ander Slade 01/12/2020  Procedures:   CT angiogram chest 01/12/2020  Chest x-ray 01/12/2020, 01/10/2020  Lower extremity Dopplers 01/12/2020  Antimicrobials:   IV azithromycin 12/31/ 2021x1 dose  IV Rocephin 12/31/ 2021x1 dose  IV Levaquin  01/13/2020   Subjective: Laying on gurney in the ED on BiPAP.  Feeling more comfortable in bed on BiPAP.  Denies any chest pain.  States cough is improved.  Objective: Vitals:   01/13/20 1834 01/13/20 1900 01/13/20 1950 01/13/20 2000  BP:  (!) 154/83  (!) 164/84  Pulse:  (!) 111 (!) 112 (!) 113  Resp:  (!) 31 (!) 28 (!) 23  Temp:   98.2 F (36.8 C)   TempSrc:   Oral   SpO2: 98% 98% 92% 93%    Intake/Output Summary (Last 24 hours) at 01/13/2020 2107 Last data filed at  01/13/2020 1826 Gross per 24 hour  Intake 360 ml  Output 1000 ml  Net -640 ml   There were no vitals filed for this visit.  Examination:  General exam: Appears calm and comfortable  Respiratory system: Some expiratory wheezing.  Fair air movement.  Scattered coarse breath sounds.  No crackles.  On BiPAP.   Cardiovascular system: S1 & S2 heard, RRR. No JVD, murmurs, rubs, gallops or clicks. No pedal edema. Gastrointestinal system: Abdomen is nondistended, soft and nontender. No organomegaly or masses felt. Normal bowel sounds heard. Central nervous system: Alert and oriented. No focal neurological deficits. Extremities: Symmetric 5 x 5 power. Skin: No rashes, lesions or ulcers Psychiatry: Judgement and insight appear normal. Mood & affect appropriate.     Data Reviewed: I have personally reviewed following labs and imaging studies  CBC: Recent Labs  Lab 01/12/20 1430 01/13/20 0407  WBC 9.0 7.5  HGB 13.3 12.7  HCT 39.7 39.2  MCV 96.8 98.5  PLT 274 263    Basic Metabolic Panel: Recent Labs  Lab 01/12/20 1430 01/13/20 0407  NA 134* 140  K 3.7 4.0  CL 101 103  CO2 21* 23  GLUCOSE 128* 247*  BUN 14 24*  CREATININE 1.05* 1.17*  CALCIUM 9.5 9.6    GFR: Estimated Creatinine Clearance: 34.6 mL/min (A) (by C-G formula based on SCr of 1.17 mg/dL (H)).  Liver Function Tests: Recent Labs  Lab 01/12/20 1430 01/13/20 0407  AST 29 27  ALT 20 24  ALKPHOS 93 96  BILITOT 0.6 0.4  PROT 7.7 7.6  ALBUMIN 3.3* 3.0*    CBG: Recent Labs  Lab 01/13/20 1144  GLUCAP 193*     Recent Results (from the past 240 hour(s))  Blood Culture (routine x 2)     Status: None (Preliminary result)   Collection Time: 01/12/20  2:11 PM   Specimen: BLOOD  Result Value Ref Range Status   Specimen Description   Final    BLOOD BLOOD RIGHT FOREARM Performed at [facility], [address] [place], [place] [address]    Special Requests   Final    BOTTLES DRAWN  AEROBIC AND ANAEROBIC Blood Culture adequate volume Performed at [facility], [address] [place], [place] [address]    Culture   Final    NO GROWTH < 24 HOURS Performed at [facility], [address] [place], [place] [address]    Report Status PENDING  Incomplete  Blood Culture (routine x 2)     Status: None (Preliminary result)   Collection Time: 01/12/20  2:16 PM   Specimen: BLOOD  Result Value Ref Range Status   Specimen Description   Final    BLOOD BLOOD LEFT FOREARM Performed at [facility], [address] [place], [place] [address]    Special Requests   Final  BOTTLES DRAWN AEROBIC AND ANAEROBIC Blood Culture adequate volume Performed at [place] [facility] [address] [place], [place] [address]    Culture   Final    NO GROWTH < 24 HOURS Performed at [facility] [address] [place], [place] [address]    Report Status PENDING  Incomplete  Resp Panel by RT-PCR (Flu A&B, Covid) Nasopharyngeal Swab     Status: None   Collection Time: 01/12/20  2:35 PM   Specimen: Nasopharyngeal Swab; Nasopharyngeal(NP) swabs in vial transport medium  Result Value Ref Range Status   SARS Coronavirus 2 by RT PCR NEGATIVE NEGATIVE Final    Comment: (NOTE) SARS-CoV-2 target nucleic acids are NOT DETECTED.  The SARS-CoV-2 RNA is generally detectable in upper respiratory specimens during the acute phase of infection. The lowest concentration of SARS-CoV-2 viral copies this assay can detect is 138 copies/mL. A negative result does not preclude SARS-Cov-2 infection and should not be used as the sole basis for treatment or other patient management decisions. A negative result may occur with  improper specimen collection/handling, submission of specimen other than nasopharyngeal swab, presence of viral mutation(s) within the areas targeted by this assay, and inadequate number of viral copies(<138 copies/mL). A  negative result must be combined with clinical observations, patient history, and epidemiological information. The expected result is Negative.  Fact Sheet for Patients:  EntrepreneurPulse.com.au  Fact Sheet for Healthcare Providers:  IncredibleEmployment.be  This test is no t yet approved or cleared by the [place] FDA and  has been authorized for detection and/or diagnosis of SARS-CoV-2 by FDA under an Emergency Use Authorization (EUA). This EUA will remain  in effect (meaning this test can be used) for the duration of the COVID-19 declaration under Section 564(b)(1) of the Act, 21 U.S.C.section 360bbb-3(b)(1), unless the authorization is terminated  or revoked sooner.       Influenza A by PCR NEGATIVE NEGATIVE Final   Influenza B by PCR NEGATIVE NEGATIVE Final    Comment: (NOTE) The Xpert Xpress SARS-CoV-2/FLU/RSV plus assay is intended as an aid in the diagnosis of influenza from Nasopharyngeal swab specimens and should not be used as a sole basis for treatment. Nasal washings and aspirates are unacceptable for Xpert Xpress SARS-CoV-2/FLU/RSV testing.  Fact Sheet for Patients: EntrepreneurPulse.com.au  Fact Sheet for Healthcare Providers: IncredibleEmployment.be  This test is not yet approved or cleared by the [place] FDA and has been authorized for detection and/or diagnosis of SARS-CoV-2 by FDA under an Emergency Use Authorization (EUA). This EUA will remain in effect (meaning this test can be used) for the duration of the COVID-19 declaration under Section 564(b)(1) of the Act, 21 U.S.C. section 360bbb-3(b)(1), unless the authorization is terminated or revoked.  Performed at [facility], [place] [address] [place], [place] [address]   MRSA PCR Screening     Status: None   Collection Time: 01/13/20  1:23 PM   Specimen: Nasal Mucosa; Nasopharyngeal  Result Value Ref  Range Status   MRSA by PCR NEGATIVE NEGATIVE Final    Comment:        The GeneXpert MRSA Assay (FDA approved for NASAL specimens only), is one component of a comprehensive MRSA colonization surveillance program. It is not intended to diagnose MRSA infection nor to guide or monitor treatment for MRSA infections. Performed at [facility], [place] [address] [place], [place] [address]          Radiology Studies: DG Chest 2 View  Result Date: 01/12/2020 CLINICAL DATA:  Cough EXAM: CHEST - 2 VIEW COMPARISON:  01/10/2020 FINDINGS: Interstitial prominence and peribronchial thickening. Right basilar airspace opacity. No confluent opacity on the left. No effusions. Heart is normal size. No acute bony abnormality. IMPRESSION: Bronchitic changes.  Right base atelectasis or infiltrate. Electronically Signed   By: Charlett Nose M.D.   On: 01/12/2020 12:23   CT ANGIO CHEST PE W OR WO CONTRAST  Result Date: 01/12/2020 CLINICAL DATA:  Pulmonary embolus suspected with high probability. Worsening shortness of breath over the last few days. Elevated D-dimer. Fever and body aches. EXAM: CT ANGIOGRAPHY CHEST WITH CONTRAST TECHNIQUE: Multidetector CT imaging of the chest was performed using the standard protocol during bolus administration of intravenous contrast. Multiplanar CT image reconstructions and MIPs were obtained to evaluate the vascular anatomy. CONTRAST:  OMNIPAQUE IOHEXOL 350 MG/ML SOLN COMPARISON:  08/11/2019 and 05/12/2019 FINDINGS: Cardiovascular: There is good opacification of the central and segmental pulmonary arteries. No focal filling defects. No evidence of significant pulmonary embolus. The heart size is normal. No pericardial effusions. Normal caliber thoracic aorta. No aortic dissection. Calcification in the aorta and coronary arteries. Great vessel origins are patent. Mediastinum/Nodes: Scattered mediastinal lymph nodes are not pathologically enlarged. No  significant lymphadenopathy. Esophagus is decompressed. Lungs/Pleura: Motion artifact limits examination but there is evidence of peribronchial thickening with patchy peribronchovascular interstitial changes and tree-in-bud infiltrates with focal consolidation in the right middle lung. Changes likely represent multifocal bronchopneumonia. No pleural effusions. No pneumothorax. Airways are patent. Upper Abdomen: Diffuse fatty infiltration of the liver. No acute abnormalities demonstrated in the visualized upper abdomen. Musculoskeletal: No chest wall abnormality. No acute or significant osseous findings. Review of the MIP images confirms the above findings. IMPRESSION: 1. No evidence of significant pulmonary embolus. 2. Peribronchial thickening with patchy peribronchovascular interstitial changes and tree-in-bud infiltrates with focal consolidation in the right middle lung. Changes likely represent multifocal bronchopneumonia. 3. Diffuse fatty infiltration of the liver. 4. Aortic atherosclerosis. Aortic Atherosclerosis (ICD10-I70.0). Electronically Signed   By: Burman Nieves M.D.   On: 01/12/2020 20:13   VAS [place] LOWER EXTREMITY VENOUS (DVT)  Result Date: 01/13/2020  Lower Venous DVT Study Indications: SOB, edema.  Comparison Study: 05-13-2019 Prior LT lower venous study available. Performing Technologist: Jean Rosenthal RDMS  Examination Guidelines: A complete evaluation includes B-mode imaging, spectral Doppler, color Doppler, and power Doppler as needed of all accessible portions of each vessel. Bilateral testing is considered an integral part of a complete examination. Limited examinations for reoccurring indications may be performed as noted. The reflux portion of the exam is performed with the patient in reverse Trendelenburg.  +---------+---------------+---------+-----------+----------+--------------+ RIGHT    CompressibilityPhasicitySpontaneityPropertiesThrombus Aging  +---------+---------------+---------+-----------+----------+--------------+ CFV      Full           Yes      Yes                                 +---------+---------------+---------+-----------+----------+--------------+ SFJ      Full                                                        +---------+---------------+---------+-----------+----------+--------------+ FV Prox  Full                                                        +---------+---------------+---------+-----------+----------+--------------+  FV Mid   Full                                                        +---------+---------------+---------+-----------+----------+--------------+ FV DistalFull                                                        +---------+---------------+---------+-----------+----------+--------------+ PFV      Full                                                        +---------+---------------+---------+-----------+----------+--------------+ POP      Full           Yes      Yes                                 +---------+---------------+---------+-----------+----------+--------------+ PTV      Full                                                        +---------+---------------+---------+-----------+----------+--------------+ PERO     Full                                                        +---------+---------------+---------+-----------+----------+--------------+   +---------+---------------+---------+-----------+----------+--------------+ LEFT     CompressibilityPhasicitySpontaneityPropertiesThrombus Aging +---------+---------------+---------+-----------+----------+--------------+ CFV      Full           Yes      Yes                                 +---------+---------------+---------+-----------+----------+--------------+ SFJ      Full                                                         +---------+---------------+---------+-----------+----------+--------------+ FV Prox  Full                                                        +---------+---------------+---------+-----------+----------+--------------+ FV Mid   Full                                                        +---------+---------------+---------+-----------+----------+--------------+  FV DistalFull                                                        +---------+---------------+---------+-----------+----------+--------------+ PFV      Full                                                        +---------+---------------+---------+-----------+----------+--------------+ POP      Full           Yes      Yes                                 +---------+---------------+---------+-----------+----------+--------------+ PTV      Full                                                        +---------+---------------+---------+-----------+----------+--------------+ PERO     Full                                                        +---------+---------------+---------+-----------+----------+--------------+     Summary: RIGHT: - There is no evidence of deep vein thrombosis in the lower extremity.  - No cystic structure found in the popliteal fossa.  LEFT: - There is no evidence of deep vein thrombosis in the lower extremity.  - No cystic structure found in the popliteal fossa.  *See table(s) above for measurements and observations. Electronically signed by Ruta Hinds MD on 01/13/2020 at 9:55:29 AM.    Final         Scheduled Meds: . budesonide (PULMICORT) nebulizer solution  0.5 mg Nebulization BID  . chlorhexidine  15 mL Mouth Rinse BID  . Chlorhexidine Gluconate Cloth  6 each Topical Daily  . cholecalciferol  1,000 Units Oral q morning - 10a  . enoxaparin (LOVENOX) injection  40 mg Subcutaneous Q24H  . fluticasone  2 spray Each Nare Daily  . guaiFENesin  1,200 mg Oral BID  . insulin  aspart  0-20 Units Subcutaneous TID WC  . ipratropium  0.5 mg Nebulization Q6H  . levalbuterol  1.25 mg Nebulization Q6H  . loratadine  10 mg Oral Daily  . mouth rinse  15 mL Mouth Rinse q12n4p  . methylPREDNISolone (SOLU-MEDROL) injection  60 mg Intravenous Q6H  . nicotine  14 mg Transdermal Q24H  . pantoprazole  40 mg Oral Q0600  . venlafaxine XR  75 mg Oral Daily   Continuous Infusions: . levofloxacin (LEVAQUIN) IV Stopped (01/13/20 1957)     LOS: 1 day    Time spent: 40 minutes    Irine Seal, MD [facility]   To contact the attending provider between 7A-7P or the covering provider during after hours 7P-7A, please log into the web site [email or web address] and access using universal [place]  password for that web site. If you do not have the password, please call the hospital operator.  01/13/2020, 9:07 PM

## 2020-01-14 LAB — CBC WITH DIFFERENTIAL/PLATELET
Abs Immature Granulocytes: 0.48 10*3/uL — ABNORMAL HIGH (ref 0.00–0.07)
Basophils Absolute: 0.1 10*3/uL (ref 0.0–0.1)
Basophils Relative: 1 %
Eosinophils Absolute: 0 10*3/uL (ref 0.0–0.5)
Eosinophils Relative: 0 %
HCT: 37.8 % (ref 36.0–46.0)
Hemoglobin: 12.3 g/dL (ref 12.0–15.0)
Immature Granulocytes: 5 %
Lymphocytes Relative: 13 %
Lymphs Abs: 1.3 10*3/uL (ref 0.7–4.0)
MCH: 32.6 pg (ref 26.0–34.0)
MCHC: 32.5 g/dL (ref 30.0–36.0)
MCV: 100.3 fL — ABNORMAL HIGH (ref 80.0–100.0)
Monocytes Absolute: 0.9 10*3/uL (ref 0.1–1.0)
Monocytes Relative: 9 %
Neutro Abs: 7 10*3/uL (ref 1.7–7.7)
Neutrophils Relative %: 72 %
Platelets: 272 10*3/uL (ref 150–400)
RBC: 3.77 MIL/uL — ABNORMAL LOW (ref 3.87–5.11)
RDW: 13.7 % (ref 11.5–15.5)
WBC: 9.7 10*3/uL (ref 4.0–10.5)
nRBC: 0 % (ref 0.0–0.2)

## 2020-01-14 LAB — BASIC METABOLIC PANEL
Anion gap: 13 (ref 5–15)
BUN: 31 mg/dL — ABNORMAL HIGH (ref 8–23)
CO2: 21 mmol/L — ABNORMAL LOW (ref 22–32)
Calcium: 9.3 mg/dL (ref 8.9–10.3)
Chloride: 106 mmol/L (ref 98–111)
Creatinine, Ser: 1.03 mg/dL — ABNORMAL HIGH (ref 0.44–1.00)
GFR, Estimated: 59 mL/min — ABNORMAL LOW (ref >60)
Glucose, Bld: 192 mg/dL — ABNORMAL HIGH (ref 70–99)
Potassium: 3.8 mmol/L (ref 3.5–5.1)
Sodium: 140 mmol/L (ref 135–145)

## 2020-01-14 LAB — GLUCOSE, CAPILLARY
Glucose-Capillary: 200 mg/dL — ABNORMAL HIGH (ref 70–99)
Glucose-Capillary: 189 mg/dL — ABNORMAL HIGH (ref 70–99)
Glucose-Capillary: 110 mg/dL — ABNORMAL HIGH (ref 70–99)
Glucose-Capillary: 159 mg/dL — ABNORMAL HIGH (ref 70–99)

## 2020-01-14 LAB — MAGNESIUM: Magnesium: 2.5 mg/dL — ABNORMAL HIGH (ref 1.7–2.4)

## 2020-01-14 LAB — HEMOGLOBIN A1C
Hgb A1c MFr Bld: 5.7 % — ABNORMAL HIGH (ref 4.8–5.6)
Mean Plasma Glucose: 116.89 mg/dL

## 2020-01-14 MED ORDER — MELATONIN 5 MG PO TABS
5.0000 mg | ORAL_TABLET | Freq: Every evening | ORAL | Status: DC | PRN
Start: 2020-01-14 — End: 2020-01-15
  Administered 2020-01-14 – 2020-01-15 (×2): 5 mg via ORAL
  Filled 2020-01-14 (×2): qty 1

## 2020-01-14 MED ORDER — METHYLPREDNISOLONE SODIUM SUCC 125 MG IJ SOLR: 60.0000 mg | Freq: Two times a day (BID) | INTRAMUSCULAR | Status: DC

## 2020-01-14 MED ORDER — LEVOFLOXACIN 750 MG PO TABS
750.0000 mg | ORAL_TABLET | ORAL | Status: DC
  Administered 2020-01-15: 750 mg via ORAL
  Filled 2020-01-14: qty 1

## 2020-01-14 MED ORDER — CARVEDILOL 3.125 MG PO TABS
3.1250 mg | ORAL_TABLET | Freq: Two times a day (BID) | ORAL | Status: DC
  Administered 2020-01-14 (×2): 3.125 mg via ORAL
  Filled 2020-01-14 (×2): qty 1

## 2020-01-14 MED ORDER — LIP MEDEX EX OINT
TOPICAL_OINTMENT | CUTANEOUS | Status: DC | PRN
  Filled 2020-01-14: qty 7

## 2020-01-14 MED ORDER — ACETAMINOPHEN 325 MG PO TABS
650.0000 mg | ORAL_TABLET | Freq: Four times a day (QID) | ORAL | Status: DC | PRN
  Administered 2020-01-14: 650 mg via ORAL
  Filled 2020-01-14: qty 2

## 2020-01-14 MED ORDER — METHYLPREDNISOLONE SODIUM SUCC 125 MG IJ SOLR
60.0000 mg | Freq: Two times a day (BID) | INTRAMUSCULAR | Status: DC
  Administered 2020-01-15: 60 mg via INTRAVENOUS
  Filled 2020-01-14: qty 2

## 2020-01-14 NOTE — Progress Notes (Signed)
PROGRESS NOTE  Kimberly [name]  DOB: [birth year]  PCP: Lupita Raider, MD FTD:[id number]  DOA: 01/12/2020  LOS: 2 days   Chief Complaint  Patient presents with  . Cough   Brief narrative: Kimberly [name] is a 70 y.o. female with PMH significant for COPD not on home O2, current everyday smoker, HTN, HLD, anxiety, depression, osteoporosis, diverticulosis.   Patient presented to the ED on 12/31 with complaint of worsening shortness of breath, fever, generalized body aches for 4 days.   In the ED, she was noted to be hypoxic with sats in the 80s on room air, tachypneic, tachycardic. COVID-19 PCR negative.  Influenza A and B negative. CT scan of chest showed multifocal bronchopneumonia. Patient was admitted for hospital service.  PCCM was consulted  Subjective: Patient was seen and examined this morning.  Pleasant elderly Caucasian female.  Sitting up in chair.  Not in distress.  On 3 L oxygen by nasal cannula.  Tachycardic persistently more than 90s.  Blood glucose level in 200s. Husband at bedside  Assessment/Plan: Acute respiratory failure with hypoxia Acute exacerbation of COPD Multifocal pneumonia -Presented with shortness of breath, fever, tachycardia, tachypnea, hypoxia -Multifocal pneumonia noted in chest imaging. -Currently on IV Levaquin, IV Solu-Medrol, bronchodilators, antitussives, incentive spirometry. -Clinically improving. -On 3 L oxygen this morning. -Check ambulatory oxygen requirement. -Currently on Solu-Medrol 60 mg IV every 8 hours.  Taper down to twice daily.  Sinus tachycardia -Persistent sinus tachycardia noted.  Patient states that her heart rate is always elevated.  Not on any beta-blocker or other AV nodal blocking agent at home.  Start on Coreg 3.125 mg twice daily.  Essential hypertension -Lisinopril on hold.  Monitor blood pressure on Coreg. -Echo from 07/2019 with EF 65 to 70%, no LVH, grade 1 diastolic dysfunction.  Current everyday smoker -Counseled  to quit.  Nicotine patch offered.  Elevated D-dimer -Negative CT angio chest and DVT scan of extremities.    Hyperlipidemia -Currently off statin secondary to myalgias.  Outpatient follow-up with PCP.   Depression -Continue Effexor.  Hyperglycemia -Likely steroid-induced.   -A1c 5.7 on 1/2.  On sliding scale insulin with Accu-Cheks. Recent Labs  Lab 01/13/20 1144 01/13/20 2120 01/14/20 0820 01/14/20 1235  GLUCAP 193* 243* 200* 189*   Mobility: Encourage ambulation.  PT eval ordered Code Status:   Code Status: Full Code  Nutritional status: There is no height or weight on file to calculate BMI.     Diet Order            Diet heart healthy/carb modified Room service appropriate? Yes; Fluid consistency: Thin  Diet effective now                 DVT prophylaxis: enoxaparin (LOVENOX) injection 40 mg Start: 01/13/20 1000 SCDs Start: 01/12/20 1650   Antimicrobials:  IV Levaquin Fluid: Normal saline at 75 mL/h Consultants: Pulmonary Family Communication:  Husband at bedside  Status is: Inpatient Remains inpatient appropriate because -continues require IV steroids and IV antibiotics  Dispo: The patient is from: Home              Anticipated d/c is to: Hopefully home.  Pending PT eval              Anticipated d/c date is: 1 to 2 days              Patient currently is not medically stable to d/c.       Infusions:  . sodium chloride 75 mL/hr  at 01/14/20 0014    Scheduled Meds: . budesonide (PULMICORT) nebulizer solution  0.5 mg Nebulization BID  . carvedilol  3.125 mg Oral BID WC  . chlorhexidine  15 mL Mouth Rinse BID  . Chlorhexidine Gluconate Cloth  6 each Topical Daily  . cholecalciferol  1,000 Units Oral q morning - 10a  . enoxaparin (LOVENOX) injection  40 mg Subcutaneous Q24H  . fluticasone  2 spray Each Nare Daily  . guaiFENesin  1,200 mg Oral BID  . insulin aspart  0-20 Units Subcutaneous TID WC  . ipratropium  0.5 mg Nebulization Q6H  .  levalbuterol  1.25 mg Nebulization Q6H  . [START ON 01/15/2020] levofloxacin  750 mg Oral Q48H  . loratadine  10 mg Oral Daily  . mouth rinse  15 mL Mouth Rinse q12n4p  . [START ON 01/15/2020] methylPREDNISolone (SOLU-MEDROL) injection  60 mg Intravenous Q12H  . nicotine  14 mg Transdermal Q24H  . pantoprazole  40 mg Oral Q0600  . venlafaxine XR  75 mg Oral Daily    Antimicrobials: Anti-infectives (From admission, onward)   Start     Dose/Rate Route Frequency Ordered Stop   01/15/20 1000  levofloxacin (LEVAQUIN) tablet 750 mg        750 mg Oral Every 48 hours 01/14/20 1151     01/13/20 1800  levofloxacin (LEVAQUIN) IVPB 750 mg  Status:  Discontinued        750 mg 100 mL/hr over 90 Minutes Intravenous Every 48 hours 01/12/20 1747 01/14/20 1151   01/12/20 1615  cefTRIAXone (ROCEPHIN) 1 g in sodium chloride 0.9 % 100 mL IVPB        1 g 200 mL/hr over 30 Minutes Intravenous  Once 01/12/20 1606 01/12/20 1745   01/12/20 1615  azithromycin (ZITHROMAX) 500 mg in sodium chloride 0.9 % 250 mL IVPB        500 mg 250 mL/hr over 60 Minutes Intravenous  Once 01/12/20 1606 01/12/20 1745      PRN meds: acetaminophen, hydrALAZINE, lip balm, melatonin   Objective: Vitals:   01/14/20 1118 01/14/20 1404  BP: (!) 158/77   Pulse: (!) 123   Resp: (!) 32   Temp:    SpO2: 96% 98%    Intake/Output Summary (Last 24 hours) at 01/14/2020 1544 Last data filed at 01/14/2020 0348 Gross per 24 hour  Intake 360 ml  Output 1000 ml  Net -640 ml   There were no vitals filed for this visit. Weight change:  There is no height or weight on file to calculate BMI.   Physical Exam: General exam: Pleasant, elderly, not in distress. Skin: No rashes, lesions or ulcers. HEENT: Atraumatic, normocephalic, no obvious bleeding Lungs: Clear to auscultation bilaterally CVS: Sinus tachycardia present.  No murmur GI/Abd soft, nontender, nondistended, bowel sound present CNS: Alert, awake, oriented x3 Psychiatry: Mood  appropriate Extremities: No pedal edema, no calf tenderness  Data Review: I have personally reviewed the laboratory data and studies available.  Recent Labs  Lab 01/12/20 1430 01/13/20 0407 01/14/20 0249  WBC 9.0 7.5 9.7  NEUTROABS  --   --  7.0  HGB 13.3 12.7 12.3  HCT 39.7 39.2 37.8  MCV 96.8 98.5 100.3*  PLT 274 263 272   Recent Labs  Lab 01/12/20 1430 01/13/20 0407 01/14/20 0249  NA 134* 140 140  K 3.7 4.0 3.8  CL 101 103 106  CO2 21* 23 21*  GLUCOSE 128* 247* 192*  BUN 14 24* 31*  CREATININE 1.05*  1.17* 1.03*  CALCIUM 9.5 9.6 9.3  MG  --   --  2.5*    F/u labs ordered  Signed, Terrilee Croak, MD [facility] 01/14/2020

## 2020-01-14 NOTE — Progress Notes (Signed)
   NAME:  Kimberly [name], MRN:  [id number], DOB:  1950/02/06, LOS: 2 ADMISSION DATE:  01/12/2020, CONSULTATION DATE: 01/12/2020 REFERRING MD: Dr. Ashley Royalty, CHIEF COMPLAINT: Acute hypoxemic respiratory failure  Brief History:  Worsening shortness of breath in the last few days Has had fever, body aches for about 4 days Grandson with cold symptoms  Background history of obstructive lung disease, active smoker about a pack a day  Appears to be stabilizing with no need for BiPAP Still requiring oxygen supplementation, was not on oxygen at home  Past Medical History:   Past Medical History:  Diagnosis Date  . Anxiety   . Arthritis    osteoarthritis  . Bronchitis   . Cataract   . Complication of anesthesia    s/p bladder retention-"only time it happened"  . COPD (chronic obstructive pulmonary disease) (HCC)   . Depression   . Generalized headaches   . Heart murmur    heard occ  . Hiatal hernia    small- no problems  . History of blood in urine    microscopic -no problems identified," occ." high protein in urine"  . History of goiter   . Hyperlipemia   . Hypertension   . Osteoporosis   . Sigmoid diverticulosis    pt unaware  . Subcutaneous mass    RIGHT FKANK AND LEFT LATERAL CHEST WALL  . Wears glasses   . Wears glasses   . Wears partial dentures    Upper and lower    Significant Hospital Events:  Was able to come off BiPAP today 01/13/2020  Consults:  pccm Procedures:  None  Significant Diagnostic Tests:  Chest x-ray-no acute infiltrate  Micro Data:  Blood culture 01/12/2020-negative to date  Antimicrobials:  Levaquin  Interim History / Subjective:  Continues to improve Has not required BiPAP On 3 L oxygen  Objective   Blood pressure 139/69, pulse 98, temperature 97.7 F (36.5 C), temperature source Oral, resp. rate (!) 28, SpO2 98 %.        Intake/Output Summary (Last 24 hours) at 01/14/2020 0900 Last data filed at 01/14/2020 0348 Gross per 24 hour   Intake 360 ml  Output 2000 ml  Net -1640 ml   There were no vitals filed for this visit.  Examination: General: Middle-age does not appear to be distress HENT: Moist oral mucosa Lungs: Decreased air movement bilaterally, does have some wheezing Cardiovascular: S1-S2 appreciated Abdomen: Bowel sounds appreciated Extremities: No clubbing, no edema Neuro: Alert and oriented x3 GU:   Resolved Hospital Problem list     Assessment & Plan:  Acute hypoxemic respiratory failure Related to decompensated COPD Possible pneumonia -Continue bronchodilators, steroids, complete course of antibiotics  Pneumonia Complete antibiotic therapy  PFT did reveal severe obstructive disease -On Stiolto and albuterol in the outpatient setting Previous CT suggestive of RB ILD Importance of quitting smoking reiterated  History of depression History of hyperlipidemia History of hypertension  CT chest negative for pulmonary embolism  Continues to stabilize  Patient can be transferred to medical floor  Will need oxygen supplementation if not able to wean off in the short-term  Virl Diamond, MD [facility] Pager: [phone]

## 2020-01-14 NOTE — Progress Notes (Signed)
Pt. Placed on CPAP @ this time, 2 lpm added to circuit, tolerating well.

## 2020-01-14 NOTE — Progress Notes (Signed)
Protective barrier placed over pts. bridge of nose due to previous mask placement, placed to room air per current settings, RT to monitor.

## 2020-01-14 NOTE — Progress Notes (Signed)
PHARMACIST - PHYSICIAN COMMUNICATION DR:   P[place] CONCERNING: Antibiotic IV to Oral Route Change Policy  RECOMMENDATION: This patient is receiving Levaquin by the intravenous route.  Based on criteria approved by the Pharmacy and Therapeutics Committee, the antibiotic(s) is/are being converted to the equivalent oral dose form(s).  Otho Bellows PharmD 01/14/2020, 11:52 AM    DESCRIPTION: These criteria include:  Patient being treated for a respiratory tract infection, urinary tract infection, cellulitis or clostridium difficile associated diarrhea if on metronidazole  The patient is not neutropenic and does not exhibit a GI malabsorption state  The patient is eating (either orally or via tube) and/or has been taking other orally administered medications for a least 24 hours  The patient is improving clinically and has a Tmax < 100.5  If you have questions about this conversion, please contact the Pharmacy Department  []   [phone] )  [phone] []   [phone] )  [facility] []   [phone] )  [facility] []   [phone] )  [facility] [x]   [phone] )  [facility]

## 2020-01-15 LAB — BASIC METABOLIC PANEL
Anion gap: 11 (ref 5–15)
BUN: 28 mg/dL — ABNORMAL HIGH (ref 8–23)
CO2: 20 mmol/L — ABNORMAL LOW (ref 22–32)
Calcium: 9.2 mg/dL (ref 8.9–10.3)
Chloride: 108 mmol/L (ref 98–111)
Creatinine, Ser: 0.98 mg/dL (ref 0.44–1.00)
GFR, Estimated: >60 mL/min (ref >60)
Glucose, Bld: 130 mg/dL — ABNORMAL HIGH (ref 70–99)
Potassium: 4 mmol/L (ref 3.5–5.1)
Sodium: 139 mmol/L (ref 135–145)

## 2020-01-15 LAB — CBC WITH DIFFERENTIAL/PLATELET
Abs Immature Granulocytes: 1.05 10*3/uL — ABNORMAL HIGH (ref 0.00–0.07)
Basophils Absolute: 0 10*3/uL (ref 0.0–0.1)
Basophils Relative: 0 %
Eosinophils Absolute: 0 10*3/uL (ref 0.0–0.5)
Eosinophils Relative: 0 %
HCT: 39.3 % (ref 36.0–46.0)
Hemoglobin: 12.6 g/dL (ref 12.0–15.0)
Immature Granulocytes: 7 %
Lymphocytes Relative: 11 %
Lymphs Abs: 1.6 10*3/uL (ref 0.7–4.0)
MCH: 32.5 pg (ref 26.0–34.0)
MCHC: 32.1 g/dL (ref 30.0–36.0)
MCV: 101.3 fL — ABNORMAL HIGH (ref 80.0–100.0)
Monocytes Absolute: 1.6 10*3/uL — ABNORMAL HIGH (ref 0.1–1.0)
Monocytes Relative: 11 %
Neutro Abs: 10.4 10*3/uL — ABNORMAL HIGH (ref 1.7–7.7)
Neutrophils Relative %: 71 %
Platelets: 298 10*3/uL (ref 150–400)
RBC: 3.88 MIL/uL (ref 3.87–5.11)
RDW: 13.6 % (ref 11.5–15.5)
WBC: 14.6 10*3/uL — ABNORMAL HIGH (ref 4.0–10.5)
nRBC: 0 % (ref 0.0–0.2)

## 2020-01-15 LAB — GLUCOSE, CAPILLARY
Glucose-Capillary: 147 mg/dL — ABNORMAL HIGH (ref 70–99)
Glucose-Capillary: 143 mg/dL — ABNORMAL HIGH (ref 70–99)

## 2020-01-15 LAB — MAGNESIUM: Magnesium: 2.4 mg/dL (ref 1.7–2.4)

## 2020-01-15 LAB — PHOSPHORUS: Phosphorus: 3.6 mg/dL (ref 2.5–4.6)

## 2020-01-15 MED ORDER — LEVOFLOXACIN 750 MG PO TABS: 750.0000 mg | ORAL_TABLET | ORAL | 0 refills | Status: AC

## 2020-01-15 MED ORDER — CARVEDILOL 6.25 MG PO TABS
6.2500 mg | ORAL_TABLET | Freq: Two times a day (BID) | ORAL | Status: DC
  Administered 2020-01-15: 6.25 mg via ORAL

## 2020-01-15 MED ORDER — SACCHAROMYCES BOULARDII 250 MG PO CAPS: 250.0000 mg | ORAL_CAPSULE | Freq: Two times a day (BID) | ORAL | 0 refills | Status: AC

## 2020-01-15 MED ORDER — PREDNISONE 10 MG PO TABS: ORAL_TABLET | ORAL | 0 refills | Status: DC

## 2020-01-15 MED ORDER — CARVEDILOL 6.25 MG PO TABS: 6.2500 mg | ORAL_TABLET | Freq: Two times a day (BID) | ORAL | 2 refills | Status: AC

## 2020-01-15 MED ORDER — GUAIFENESIN ER 600 MG PO TB12: 1200.0000 mg | ORAL_TABLET | Freq: Two times a day (BID) | ORAL | 0 refills | Status: AC

## 2020-01-15 MED ORDER — LISINOPRIL 20 MG PO TABS: 20.0000 mg | ORAL_TABLET | ORAL | Status: DC

## 2020-01-15 NOTE — Discharge Instructions (Signed)
Community-Acquired Pneumonia, Adult Pneumonia is an infection of the lungs. It causes swelling in the airways of the lungs. Mucus and fluid may also build up inside the airways. One type of pneumonia can happen while a person is in a hospital. A different type can happen when a person is not in a hospital (community-acquired pneumonia).  What are the causes?  This condition is caused by germs (viruses, bacteria, or fungi). Some types of germs can be passed from one person to another. This can happen when you breathe in droplets from the cough or sneeze of an infected person. What increases the risk? You are more likely to develop this condition if you:  Have a long-term (chronic) disease, such as: ? Chronic obstructive pulmonary disease (COPD). ? Asthma. ? Cystic fibrosis. ? Congestive heart failure. ? Diabetes. ? Kidney disease.  Have HIV.  Have sickle cell disease.  Have had your spleen removed.  Do not take good care of your teeth and mouth (poor dental hygiene).  Have a medical condition that increases the risk of breathing in droplets from your own mouth and nose.  Have a weakened body defense system (immune system).  Are a smoker.  Travel to areas where the germs that cause this illness are common.  Are around certain animals or the places they live. What are the signs or symptoms?  A dry cough.  A wet (productive) cough.  Fever.  Sweating.  Chest pain. This often happens when breathing deeply or coughing.  Fast breathing or trouble breathing.  Shortness of breath.  Shaking chills.  Feeling tired (fatigue).  Muscle aches. How is this treated? Treatment for this condition depends on many things. Most adults can be treated at home. In some cases, treatment must happen in a hospital. Treatment may include:  Medicines given by mouth or through an IV tube.  Being given extra oxygen.  Respiratory therapy. In rare cases, treatment for very bad pneumonia  may include:  Using a machine to help you breathe.  Having a procedure to remove fluid from around your lungs. Follow these instructions at home: Medicines  Take over-the-counter and prescription medicines only as told by your doctor. ? Only take cough medicine if you are losing sleep.  If you were prescribed an antibiotic medicine, take it as told by your doctor. Do not stop taking the antibiotic even if you start to feel better. General instructions   Sleep with your head and neck raised (elevated). You can do this by sleeping in a recliner or by putting a few pillows under your head.  Rest as needed. Get at least 8 hours of sleep each night.  Drink enough water to keep your pee (urine) pale yellow.  Eat a healthy diet that includes plenty of vegetables, fruits, whole grains, low-fat dairy products, and lean protein.  Do not use any products that contain nicotine or tobacco. These include cigarettes, e-cigarettes, and chewing tobacco. If you need help quitting, ask your doctor.  Keep all follow-up visits as told by your doctor. This is important. How is this prevented? A shot (vaccine) can help prevent pneumonia. Shots are often suggested for:  People older than 70 years of age.  People older than 70 years of age who: ? Are having cancer treatment. ? Have long-term (chronic) lung disease. ? Have problems with their body's defense system. You may also prevent pneumonia if you take these actions:  Get the flu (influenza) shot every year.  Go to the dentist as   often as told.  Wash your hands often. If you cannot use soap and water, use hand sanitizer. Contact a doctor if:  You have a fever.  You lose sleep because your cough medicine does not help. Get help right away if:  You are short of breath and it gets worse.  You have more chest pain.  Your sickness gets worse. This is very serious if: ? You are an older adult. ? Your body's defense system is weak.  You  cough up blood. Summary  Pneumonia is an infection of the lungs.  Most adults can be treated at home. Some will need treatment in a hospital.  Drink enough water to keep your pee pale yellow.  Get at least 8 hours of sleep each night. This information is not intended to replace advice given to you by your health care provider. Make sure you discuss any questions you have with your health care provider. Document Revised: 04/20/2018 Document Reviewed: 08/26/2017 [employer]vier Patient Education  2020 [employer]  

## 2020-01-15 NOTE — Discharge Summary (Signed)
Physician Discharge Summary  Kimberly PolioJanet A Lynes ZOX:[id number] DOB: 03-23-1950 DOA: 01/12/2020  PCP: Kimberly [name], [name], MD  Admit date: 01/12/2020 Discharge date: 01/15/2020  Admitted From: Home Discharge disposition: Home   Code Status: Full Code  Diet Recommendation: Cardiac/diabetic diet  Discharge Diagnosis:   Principal Problem:   Acute hypoxemic respiratory failure (HCC) Active Problems:   Tobacco abuse   CAP (community acquired pneumonia)   Hyperlipidemia   Essential hypertension   Elevated d-dimer   Depression   COPD with acute exacerbation [facility](HCC)   Chief Complaint  Patient presents with  . Cough   Brief narrative: Kimberly [name] is a 70 y.o. female with PMH significant for COPD not on home O2, current everyday smoker, HTN, HLD, anxiety, depression, osteoporosis, diverticulosis.   Patient presented to the ED on 12/31 with complaint of worsening shortness of breath, fever, generalized body aches for 4 days.   In the ED, she was noted to be hypoxic with sats in the 80s on room air, tachypneic, tachycardic. COVID-19 PCR negative.  Influenza A and B negative. CT scan of chest showed multifocal bronchopneumonia. Patient was admitted for hospital service.  PCCM was consulted  Subjective: Patient was seen and examined this morning.  Pleasant elderly Caucasian female.  Sitting up in chair.  Not in distress.  Not in supplemental oxygen this morning.  Was able to ambulate inside the room without oxygen.  Tachycardia gradually improving.   Assessment/Plan: Acute respiratory failure with hypoxia Acute exacerbation of COPD Multifocal pneumonia -Presented with shortness of breath, fever, tachycardia, tachypnea, hypoxia -Multifocal pneumonia noted in chest imaging. -Currently on IV Levaquin, IV Solu-Medrol, bronchodilators, antitussives, incentive spirometry. -Clinically improving.  Discharge on 5 more days of oral Levaquin and a tapering course of prednisone. -Continue  bronchodilators at home.  Sinus tachycardia Essential hypertension -Persistent sinus tachycardia noted.  Started on Coreg yesterday.  Partially controlled tachycardia.  Increase Coreg to 6.25 mg this morning and discharge on the same.  -Prior to admission, patient was on lisinopril 20 mg daily at home.  I have advised patient to monitor blood pressure at home.  If the systolic blood pressure is more than 140, patient will resume lisinopril at half dose of 10 mg daily and increase as needed. -Echo from 07/2019 with EF 65 to 70%, no LVH, grade 1 diastolic dysfunction.  Current everyday smoker -Counseled to quit.  Nicotine patch offered.  Elevated D-dimer -Negative CT angio chest and DVT scan of extremities.    Hyperlipidemia -Currently off statin secondary to myalgias.  Outpatient follow-up with PCP.   Depression -Continue Effexor.  Hyperglycemia -Likely steroid-induced.   -A1c 5.7 on January 2.  Continue diet control at home  Stable for discharge to home today.  Wound care: Incision (Closed) 09/28/13 Neck Other (Comment) (Active)  Date First Assessed/Time First Assessed: 09/28/13 1255   Location: Neck  Location Orientation: Other (Comment)    Assessments 09/28/2013  1:23 PM 09/28/2013  2:10 PM  Dressing Type -- Gauze (Comment)  Dressing Clean;Dry;Intact Dry;Clean;Intact  Site / Wound Assessment -- Clean;Dry     No Linked orders to display     Incision (Closed) 07/26/18 Abdomen Left (Active)  Date First Assessed/Time First Assessed: 07/26/18 1326   Location: Abdomen  Location Orientation: Left    Assessments 07/26/2018  2:30 PM 07/26/2018  6:00 PM  Dressing Type Gauze (Comment);Transparent dressing;Adhesive strips;Abdominal binder Gauze (Comment);Transparent dressing;Adhesive strips;Abdominal binder  Dressing Intact;New drainage Intact;Old drainage (marked)  Site / Wound Assessment Dressing in place / Unable  to assess Dressing in place / Unable to assess  Drainage Amount  Minimal Minimal  Drainage Description Serosanguineous Serosanguineous     No Linked orders to display     Incision (Closed) 07/26/18 Leg Right (Active)  Date First Assessed/Time First Assessed: 07/26/18 1326   Location: Leg  Location Orientation: Right    Assessments 07/26/2018  2:30 PM 07/26/2018  6:00 PM  Dressing Type Adhesive strips;Liquid skin adhesive Adhesive strips;Liquid skin adhesive  Dressing Clean;Dry;Intact Clean;Dry;Intact  Drainage Amount None None     No Linked orders to display    Discharge Exam:   Vitals:   01/15/20 0540 01/15/20 0812 01/15/20 0827 01/15/20 0910  BP: (!) 159/81   (!) 162/82  Pulse: 98   (!) 113  Resp: (!) 30   (!) 23  Temp: 97.7 F (36.5 C)   98 F (36.7 C)  TempSrc: Oral     SpO2: 94% 91% 98% 97%    There is no height or weight on file to calculate BMI.  General exam: Pleasant, elderly Caucasian female.  Not in distress Skin: No rashes, lesions or ulcers. HEENT: Atraumatic, normocephalic, no obvious bleeding Lungs: Clear to auscultation bilaterally CVS: Regular rate and rhythm, no murmur GI/Abd soft, nontender, nondistended, bowel sound present CNS: Alert, awake, oriented x3 Psychiatry: Mood appropriate Extremities: No pedal edema, no calf tenderness  Follow ups:   Discharge Instructions    Diet - low sodium heart healthy   Complete by: As directed    Diet Carb Modified   Complete by: As directed    Increase activity slowly   Complete by: As directed       Follow-up Information    Mayra Neer, MD Follow up.   Specialty: Family Medicine Contact information: [address] [place] [address] [phone]               Recommendations for Outpatient Follow-Up:   1. Follow-up with PCP as an outpatient 2. Follow-up with pulmonology as an outpatient  Discharge Instructions:  Follow with Primary MD Mayra Neer, MD in 7 days   Get CBC/BMP checked in next visit within 1 week by PCP or SNF MD  ( we routinely change or add medications that can affect your baseline labs and fluid status, therefore we recommend that you get the mentioned basic workup next visit with your PCP, your PCP may decide not to get them or add new tests based on their clinical decision)  On your next visit with your PCP, please Get Medicines reviewed and adjusted.  Please request your PCP  to go over all Hospital Tests and Procedure/Radiological results at the follow up, please get all Hospital records sent to your Prim MD by signing hospital release before you go home.  Activity: As tolerated with Full fall precautions use walker/cane & assistance as needed  For Heart failure patients - Check your Weight same time everyday, if you gain over 2 pounds, or you develop in leg swelling, experience more shortness of breath or chest pain, call your Primary MD immediately. Follow Cardiac Low Salt Diet and 1.5 lit/day fluid restriction.  If you have smoked or chewed Tobacco in the last 2 yrs please stop smoking, stop any regular Alcohol  and or any Recreational drug use.  If you experience worsening of your admission symptoms, develop shortness of breath, life threatening emergency, suicidal or homicidal thoughts you must seek medical attention immediately by calling 911 or calling your MD immediately  if symptoms  less severe.  You Must read complete instructions/literature along with all the possible adverse reactions/side effects for all the Medicines you take and that have been prescribed to you. Take any new Medicines after you have completely understood and accpet all the possible adverse reactions/side effects.   Do not drive, operate heavy machinery, perform activities at heights, swimming or participation in water activities or provide baby sitting services if your were admitted for syncope or siezures until you have seen by Primary MD or a Neurologist and advised to do so again.  Do not drive when taking Pain  medications.  Do not take more than prescribed Pain, Sleep and Anxiety Medications  Wear Seat belts while driving.   Please note You were cared for by a hospitalist during your hospital stay. If you have any questions about your discharge medications or the care you received while you were in the hospital after you are discharged, you can call the unit and asked to speak with the hospitalist on call if the hospitalist that took care of you is not available. Once you are discharged, your primary care physician will handle any further medical issues. Please note that NO REFILLS for any discharge medications will be authorized once you are discharged, as it is imperative that you return to your primary care physician (or establish a relationship with a primary care physician if you do not have one) for your aftercare needs so that they can reassess your need for medications and monitor your lab values.    Allergies as of 01/15/2020      Reactions   Crestor [rosuvastatin]    Unknown   Oxycodone Itching   Fosamax [alendronate]    Muscles spasms and cramps   Penicillins Itching, Rash, Other (See Comments)   Did it involve swelling of the face/tongue/throat, SOB, or low BP? No Did it involve sudden or severe rash/hives, skin peeling, or any reaction on the inside of your mouth or nose? Yes Did you need to seek medical attention at a hospital or doctor's office? Yes When did it last happen? Long time ago If all above answers are "NO", may proceed with cephalosporin use.   Statins    Muscle spasms and cramps   Sulfonamide Derivatives Rash      Medication List    STOP taking these medications   lisinopril 20 MG tablet Commonly known as: ZESTRIL   Spiriva Respimat 2.5 MCG/ACT Aers Generic drug: Tiotropium Bromide Monohydrate     TAKE these medications   acetaminophen 500 MG tablet Commonly known as: TYLENOL Take 1,000 mg by mouth every 6 (six) hours as needed for moderate pain or  headache.   albuterol 108 (90 Base) MCG/ACT inhaler Commonly known as: VENTOLIN HFA Inhale 2 puffs into the lungs every 4 (four) hours as needed for wheezing or shortness of breath.   CALCIUM 600+D3 PO Take 1 tablet by mouth daily.   carvedilol 6.25 MG tablet Commonly known as: COREG Take 1 tablet (6.25 mg total) by mouth 2 (two) times daily with a meal.   CoQ10 100 MG Caps Take 100 mg by mouth daily.   denosumab 60 MG/ML Soln injection Commonly known as: PROLIA Inject 60 mg into the skin every 6 (six) months. Administer in upper arm, thigh, or abdomen   guaiFENesin 600 MG 12 hr tablet Commonly known as: MUCINEX Take 2 tablets (1,200 mg total) by mouth 2 (two) times daily for 7 days.   levofloxacin 750 MG tablet Commonly known as: LEVAQUIN  Take 1 tablet (750 mg total) by mouth every other day for 6 days. Start taking on: January 17, 2020   Livalo 2 MG Tabs Generic drug: Pitavastatin Calcium Take 1 tablet by mouth daily.   Magnesium 250 MG Tabs Take 250 mg by mouth at bedtime.   naproxen sodium 220 MG tablet Commonly known as: ALEVE Take 220 mg by mouth as needed (For pain).   predniSONE 10 MG tablet Commonly known as: DELTASONE Take 6 tabs twice a day for 2 days, then take 4 tabs twice a day for 2 days, then take 4 tabs daily for 2 days, then take 3 tabs daily for 2 days, then take 2 tabs daily for 2 days, then take 1 tab daily for 2 days, then STOP.   saccharomyces boulardii 250 MG capsule Commonly known as: FLORASTOR Take 1 capsule (250 mg total) by mouth 2 (two) times daily for 5 days.   Stiolto Respimat 2.5-2.5 MCG/ACT Aers Generic drug: Tiotropium Bromide-Olodaterol Inhale 2 puffs into the lungs daily.   venlafaxine XR 75 MG 24 hr capsule Commonly known as: EFFEXOR-XR Take 75 mg by mouth daily.   Vitamin D 1000 units capsule Take 1,000 Units by mouth every morning.       Time coordinating discharge: 35 minutes  The results of significant diagnostics  from this hospitalization (including imaging, microbiology, ancillary and laboratory) are listed below for reference.    Procedures and Diagnostic Studies:   DG Chest 2 View  Result Date: 01/12/2020 CLINICAL DATA:  Cough EXAM: CHEST - 2 VIEW COMPARISON:  01/10/2020 FINDINGS: Interstitial prominence and peribronchial thickening. Right basilar airspace opacity. No confluent opacity on the left. No effusions. Heart is normal size. No acute bony abnormality. IMPRESSION: Bronchitic changes.  Right base atelectasis or infiltrate. Electronically Signed   By: Rolm Baptise M.D.   On: 01/12/2020 12:23   CT ANGIO CHEST PE W OR WO CONTRAST  Result Date: 01/12/2020 CLINICAL DATA:  Pulmonary embolus suspected with high probability. Worsening shortness of breath over the last few days. Elevated D-dimer. Fever and body aches. EXAM: CT ANGIOGRAPHY CHEST WITH CONTRAST TECHNIQUE: Multidetector CT imaging of the chest was performed using the standard protocol during bolus administration of intravenous contrast. Multiplanar CT image reconstructions and MIPs were obtained to evaluate the vascular anatomy. CONTRAST:  193mL OMNIPAQUE IOHEXOL 350 MG/ML SOLN COMPARISON:  08/11/2019 and 05/12/2019 FINDINGS: Cardiovascular: There is good opacification of the central and segmental pulmonary arteries. No focal filling defects. No evidence of significant pulmonary embolus. The heart size is normal. No pericardial effusions. Normal caliber thoracic aorta. No aortic dissection. Calcification in the aorta and coronary arteries. Great vessel origins are patent. Mediastinum/Nodes: Scattered mediastinal lymph nodes are not pathologically enlarged. No significant lymphadenopathy. Esophagus is decompressed. Lungs/Pleura: Motion artifact limits examination but there is evidence of peribronchial thickening with patchy peribronchovascular interstitial changes and tree-in-bud infiltrates with focal consolidation in the right middle lung. Changes  likely represent multifocal bronchopneumonia. No pleural effusions. No pneumothorax. Airways are patent. Upper Abdomen: Diffuse fatty infiltration of the liver. No acute abnormalities demonstrated in the visualized upper abdomen. Musculoskeletal: No chest wall abnormality. No acute or significant osseous findings. Review of the MIP images confirms the above findings. IMPRESSION: 1. No evidence of significant pulmonary embolus. 2. Peribronchial thickening with patchy peribronchovascular interstitial changes and tree-in-bud infiltrates with focal consolidation in the right middle lung. Changes likely represent multifocal bronchopneumonia. 3. Diffuse fatty infiltration of the liver. 4. Aortic atherosclerosis. Aortic Atherosclerosis (ICD10-I70.0). Electronically Signed  By: Burman Nieves M.D.   On: 01/12/2020 20:13   VAS [place] LOWER EXTREMITY VENOUS (DVT)  Result Date: 01/13/2020  Lower Venous DVT Study Indications: SOB, edema.  Comparison Study: 05-13-2019 Prior LT lower venous study available. Performing Technologist: Jean Rosenthal RDMS  Examination Guidelines: A complete evaluation includes B-mode imaging, spectral Doppler, color Doppler, and power Doppler as needed of all accessible portions of each vessel. Bilateral testing is considered an integral part of a complete examination. Limited examinations for reoccurring indications may be performed as noted. The reflux portion of the exam is performed with the patient in reverse Trendelenburg.  +---------+---------------+---------+-----------+----------+--------------+ RIGHT    CompressibilityPhasicitySpontaneityPropertiesThrombus Aging +---------+---------------+---------+-----------+----------+--------------+ CFV      Full           Yes      Yes                                 +---------+---------------+---------+-----------+----------+--------------+ SFJ      Full                                                         +---------+---------------+---------+-----------+----------+--------------+ FV Prox  Full                                                        +---------+---------------+---------+-----------+----------+--------------+ FV Mid   Full                                                        +---------+---------------+---------+-----------+----------+--------------+ FV DistalFull                                                        +---------+---------------+---------+-----------+----------+--------------+ PFV      Full                                                        +---------+---------------+---------+-----------+----------+--------------+ POP      Full           Yes      Yes                                 +---------+---------------+---------+-----------+----------+--------------+ PTV      Full                                                        +---------+---------------+---------+-----------+----------+--------------+ PERO  Full                                                        +---------+---------------+---------+-----------+----------+--------------+   +---------+---------------+---------+-----------+----------+--------------+ LEFT     CompressibilityPhasicitySpontaneityPropertiesThrombus Aging +---------+---------------+---------+-----------+----------+--------------+ CFV      Full           Yes      Yes                                 +---------+---------------+---------+-----------+----------+--------------+ SFJ      Full                                                        +---------+---------------+---------+-----------+----------+--------------+ FV Prox  Full                                                        +---------+---------------+---------+-----------+----------+--------------+ FV Mid   Full                                                         +---------+---------------+---------+-----------+----------+--------------+ FV DistalFull                                                        +---------+---------------+---------+-----------+----------+--------------+ PFV      Full                                                        +---------+---------------+---------+-----------+----------+--------------+ POP      Full           Yes      Yes                                 +---------+---------------+---------+-----------+----------+--------------+ PTV      Full                                                        +---------+---------------+---------+-----------+----------+--------------+ PERO     Full                                                        +---------+---------------+---------+-----------+----------+--------------+  Summary: RIGHT: - There is no evidence of deep vein thrombosis in the lower extremity.  - No cystic structure found in the popliteal fossa.  LEFT: - There is no evidence of deep vein thrombosis in the lower extremity.  - No cystic structure found in the popliteal fossa.  *See table(s) above for measurements and observations. Electronically signed by Ruta Hinds MD on 01/13/2020 at 9:55:29 AM.    Final      Labs:   Basic Metabolic Panel: Recent Labs  Lab 01/12/20 1430 01/13/20 0407 01/14/20 0249 01/15/20 0528  NA 134* 140 140 139  K 3.7 4.0 3.8 4.0  CL 101 103 106 108  CO2 21* 23 21* 20*  GLUCOSE 128* 247* 192* 130*  BUN 14 24* 31* 28*  CREATININE 1.05* 1.17* 1.03* 0.98  CALCIUM 9.5 9.6 9.3 9.2  MG  --   --  2.5* 2.4  PHOS  --   --   --  3.6   GFR Estimated Creatinine Clearance: 41.3 mL/min (by C-G formula based on SCr of 0.98 mg/dL). Liver Function Tests: Recent Labs  Lab 01/12/20 1430 01/13/20 0407  AST 29 27  ALT 20 24  ALKPHOS 93 96  BILITOT 0.6 0.4  PROT 7.7 7.6  ALBUMIN 3.3* 3.0*   No results for input(s): LIPASE, AMYLASE in the last 168 hours. No  results for input(s): AMMONIA in the last 168 hours. Coagulation profile No results for input(s): INR, PROTIME in the last 168 hours.  CBC: Recent Labs  Lab 01/12/20 1430 01/13/20 0407 01/14/20 0249 01/15/20 0528  WBC 9.0 7.5 9.7 14.6*  NEUTROABS  --   --  7.0 10.4*  HGB 13.3 12.7 12.3 12.6  HCT 39.7 39.2 37.8 39.3  MCV 96.8 98.5 100.3* 101.3*  PLT 274 263 272 298   Cardiac Enzymes: No results for input(s): CKTOTAL, CKMB, CKMBINDEX, TROPONINI in the last 168 hours. BNP: Invalid input(s): POCBNP CBG: Recent Labs  Lab 01/14/20 0820 01/14/20 1235 01/14/20 1640 01/14/20 2141 01/15/20 0845  GLUCAP 200* 189* 110* 159* 143*   D-Dimer Recent Labs    01/12/20 1430 01/13/20 0407  DDIMER 2.04* 1.66*   Hgb A1c Recent Labs    01/14/20 0249  HGBA1C 5.7*   Lipid Profile Recent Labs    01/12/20 1430  TRIG 121   Thyroid function studies No results for input(s): TSH, T4TOTAL, T3FREE, THYROIDAB in the last 72 hours.  Invalid input(s): FREET3 Anemia work up Recent Labs    01/12/20 1430  FERRITIN 290   Microbiology Recent Results (from the past 240 hour(s))  Blood Culture (routine x 2)     Status: None (Preliminary result)   Collection Time: 01/12/20  2:11 PM   Specimen: BLOOD  Result Value Ref Range Status   Specimen Description   Final    BLOOD BLOOD RIGHT FOREARM Performed at [facility], [place] [address] [place], [place] [address]    Special Requests   Final    BOTTLES DRAWN AEROBIC AND ANAEROBIC Blood Culture adequate volume Performed at [facility] [address], [place], [place] [address]    Culture   Final    NO GROWTH 2 DAYS Performed at [facility] [address], [place], [place] [address]    Report Status PENDING  Incomplete  Blood Culture (routine x 2)     Status: None (Preliminary result)   Collection Time: 01/12/20  2:16 PM   Specimen: BLOOD  Result Value Ref Range Status  Specimen  Description   Final    BLOOD BLOOD LEFT FOREARM Performed at [facility] [address], [place], [place] [address]    Special Requests   Final    BOTTLES DRAWN AEROBIC AND ANAEROBIC Blood Culture adequate volume Performed at [facility] [address], [place], [place] [address]    Culture   Final    NO GROWTH 2 DAYS Performed at [facility] [address], [place], [place] [address]    Report Status PENDING  Incomplete  Resp Panel by RT-PCR (Flu A&B, Covid) Nasopharyngeal Swab     Status: None   Collection Time: 01/12/20  2:35 PM   Specimen: Nasopharyngeal Swab; Nasopharyngeal(NP) swabs in vial transport medium  Result Value Ref Range Status   SARS Coronavirus 2 by RT PCR NEGATIVE NEGATIVE Final    Comment: (NOTE) SARS-CoV-2 target nucleic acids are NOT DETECTED.  The SARS-CoV-2 RNA is generally detectable in upper respiratory specimens during the acute phase of infection. The lowest concentration of SARS-CoV-2 viral copies this assay can detect is 138 copies/mL. A negative result does not preclude SARS-Cov-2 infection and should not be used as the sole basis for treatment or other patient management decisions. A negative result may occur with  improper specimen collection/handling, submission of specimen other than nasopharyngeal swab, presence of viral mutation(s) within the areas targeted by this assay, and inadequate number of viral copies(<138 copies/mL). A negative result must be combined with clinical observations, patient history, and epidemiological information. The expected result is Negative.  Fact Sheet for Patients:  EntrepreneurPulse.com.au  Fact Sheet for Healthcare Providers:  IncredibleEmployment.be  This test is no t yet approved or cleared by the [place] FDA and  has been authorized for detection and/or diagnosis of SARS-CoV-2 by FDA under an Emergency Use  Authorization (EUA). This EUA will remain  in effect (meaning this test can be used) for the duration of the COVID-19 declaration under Section 564(b)(1) of the Act, 21 U.S.C.section 360bbb-3(b)(1), unless the authorization is terminated  or revoked sooner.       Influenza A by PCR NEGATIVE NEGATIVE Final   Influenza B by PCR NEGATIVE NEGATIVE Final    Comment: (NOTE) The Xpert Xpress SARS-CoV-2/FLU/RSV plus assay is intended as an aid in the diagnosis of influenza from Nasopharyngeal swab specimens and should not be used as a sole basis for treatment. Nasal washings and aspirates are unacceptable for Xpert Xpress SARS-CoV-2/FLU/RSV testing.  Fact Sheet for Patients: EntrepreneurPulse.com.au  Fact Sheet for Healthcare Providers: IncredibleEmployment.be  This test is not yet approved or cleared by the [place] FDA and has been authorized for detection and/or diagnosis of SARS-CoV-2 by FDA under an Emergency Use Authorization (EUA). This EUA will remain in effect (meaning this test can be used) for the duration of the COVID-19 declaration under Section 564(b)(1) of the Act, 21 U.S.C. section 360bbb-3(b)(1), unless the authorization is terminated or revoked.  Performed at [facility], [place] [address] [place], [place] [address]   MRSA PCR Screening     Status: None   Collection Time: 01/13/20  1:23 PM   Specimen: Nasal Mucosa; Nasopharyngeal  Result Value Ref Range Status   MRSA by PCR NEGATIVE NEGATIVE Final    Comment:        The GeneXpert MRSA Assay (FDA approved for NASAL specimens only), is one component of a comprehensive MRSA colonization surveillance program. It is not intended to diagnose MRSA infection nor to guide  or monitor treatment for MRSA infections. Performed at [facility], [place] [address] [place], [place] [address]      Signed: Marlowe Aschoff Tamarion Haymond  [facility] 01/15/2020, 12:06 PM

## 2020-01-15 NOTE — Evaluation (Signed)
Physical Therapy Evaluation Patient Details Name: Kimberly [name] MRN: [id number] DOB: 1950/11/16 Today's Date: 01/15/2020   History of Present Illness  70 yo female admitted with Pna, COPD exac. Hx of COPD, anxiety, depression, osteoporosis  Clinical Impression  On eval, pt was Supv-Mod Ind with mobility. She walked ~125 feet around the unit. No LOB observed. O2 91% on RA during session. Pt tolerated activity fairly well. She fatigues with ambulation. Dyspnea 3/4. Do not anticipate any f/u PT needs after discharge. Instructed pt to increase activity slowly and to rest when needed. Pt is eager to d/c home on today.     Follow Up Recommendations No PT follow up;Supervision for mobility/OOB    Equipment Recommendations  None recommended by PT    Recommendations for Other Services       Precautions / Restrictions Precautions Precautions: Fall Precaution Comments: monitor O2 Restrictions Weight Bearing Restrictions: No      Mobility  Bed Mobility               General bed mobility comments: oob in bathroom at start of session    Transfers Overall transfer level: Modified independent Equipment used: None                Ambulation/Gait Ambulation/Gait assistance: Supervision Gait Distance (Feet): 125 Feet Assistive device: None Gait Pattern/deviations: Step-through pattern;Decreased stride length     General Gait Details: slow gait speed. no lob. O2 91% on RA, dyspnea 3/4 with ambulation. Pt fatigues fairly easily.  Stairs            Wheelchair Mobility    Modified Rankin (Stroke Patients Only)       Balance Overall balance assessment: Mild deficits observed, not formally tested                                           Pertinent Vitals/Pain Pain Assessment: No/denies pain    Home Living Family/patient expects to be discharged to:: Private residence Living Arrangements: Spouse/significant other Available Help at Discharge:  Family Type of Home: House                Prior Function Level of Independence: Independent               Hand Dominance        Extremity/Trunk Assessment   Upper Extremity Assessment Upper Extremity Assessment: Overall WFL for tasks assessed    Lower Extremity Assessment Lower Extremity Assessment: Overall WFL for tasks assessed    Cervical / Trunk Assessment Cervical / Trunk Assessment: Normal  Communication   Communication: No difficulties  Cognition Arousal/Alertness: Awake/alert Behavior During Therapy: WFL for tasks assessed/performed Overall Cognitive Status: Within Functional Limits for tasks assessed                                        General Comments      Exercises     Assessment/Plan    PT Assessment Patent does not need any further PT services  PT Problem List         PT Treatment Interventions      PT Goals (Current goals can be found in the Care Plan section)  Acute Rehab PT Goals Patient Stated Goal: home today PT Goal Formulation: With patient Time For Goal Achievement:  01/29/20 Potential to Achieve Goals: Good    Frequency     Barriers to discharge        Co-evaluation               AM-PAC PT "6 Clicks" Mobility  Outcome Measure Help needed turning from your back to your side while in a flat bed without using bedrails?: None Help needed moving from lying on your back to sitting on the side of a flat bed without using bedrails?: None Help needed moving to and from a bed to a chair (including a wheelchair)?: None Help needed standing up from a chair using your arms (e.g., wheelchair or bedside chair)?: None Help needed to walk in hospital room?: None Help needed climbing 3-5 steps with a railing? : A Little 6 Click Score: 23    End of Session Equipment Utilized During Treatment: Gait belt Activity Tolerance: Patient tolerated treatment well Patient left: in chair;with call bell/phone within  reach        Time: 1050-1102 PT Time Calculation (min) (ACUTE ONLY): 12 min   Charges:   PT Evaluation $PT Eval Low Complexity: 1 Low            Faye Ramsay, PT Acute Rehabilitation  Office: [phone] Pager: [phone]

## 2020-01-15 NOTE — Progress Notes (Signed)
Brief Pulmonary Note:  Review of chart indicates on room air.  Final recs: Discharge on Trelegy (escalation from stiolto given hospitalization) Wean steroids per [facility] Pulm f/u requested  We will sign off.  No charge

## 2020-01-17 LAB — CULTURE, BLOOD (ROUTINE X 2)
Culture: NO GROWTH
Culture: NO GROWTH
Special Requests: ADEQUATE
Special Requests: ADEQUATE

## 2020-01-22 DIAGNOSIS — I129 Hypertensive chronic kidney disease with stage 1 through stage 4 chronic kidney disease, or unspecified chronic kidney disease: Secondary | ICD-10-CM | POA: Diagnosis not present

## 2020-01-22 DIAGNOSIS — J449 Chronic obstructive pulmonary disease, unspecified: Secondary | ICD-10-CM | POA: Diagnosis not present

## 2020-01-22 DIAGNOSIS — J9691 Respiratory failure, unspecified with hypoxia: Secondary | ICD-10-CM | POA: Diagnosis not present

## 2020-01-22 DIAGNOSIS — F17200 Nicotine dependence, unspecified, uncomplicated: Secondary | ICD-10-CM | POA: Diagnosis not present

## 2020-01-22 DIAGNOSIS — R7301 Impaired fasting glucose: Secondary | ICD-10-CM | POA: Diagnosis not present

## 2020-01-22 DIAGNOSIS — J189 Pneumonia, unspecified organism: Secondary | ICD-10-CM | POA: Diagnosis not present

## 2020-02-08 ENCOUNTER — Encounter: Payer: Self-pay | Admitting: Adult Health

## 2020-02-08 ENCOUNTER — Other Ambulatory Visit: Payer: Self-pay

## 2020-02-08 ENCOUNTER — Ambulatory Visit (INDEPENDENT_AMBULATORY_CARE_PROVIDER_SITE_OTHER): Payer: PPO

## 2020-02-08 ENCOUNTER — Telehealth: Payer: Self-pay | Admitting: Adult Health

## 2020-02-08 ENCOUNTER — Ambulatory Visit: Payer: PPO | Admitting: Adult Health

## 2020-02-08 VITALS — BP 110/66 | HR 94 | Temp 97.5°F | Ht 58.5 in | Wt 121.8 lb

## 2020-02-08 DIAGNOSIS — J9811 Atelectasis: Secondary | ICD-10-CM | POA: Diagnosis not present

## 2020-02-08 DIAGNOSIS — J181 Lobar pneumonia, unspecified organism: Secondary | ICD-10-CM | POA: Diagnosis not present

## 2020-02-08 DIAGNOSIS — J189 Pneumonia, unspecified organism: Secondary | ICD-10-CM | POA: Diagnosis not present

## 2020-02-08 DIAGNOSIS — J441 Chronic obstructive pulmonary disease with (acute) exacerbation: Secondary | ICD-10-CM

## 2020-02-08 DIAGNOSIS — Z720 Tobacco use: Secondary | ICD-10-CM | POA: Diagnosis not present

## 2020-02-08 NOTE — Progress Notes (Signed)
$'@Patient's$  ID: Kimberly [name], female    DOB: Sep 12, 1950, 70 y.o.   MRN: [id number]  Chief Complaint  Patient presents with  . Follow-up    Referring provider: Mayra Neer, MD  HPI: 70 year old female active smoker seen for pulmonary consult June 22, 2019 for COPD and dyspnea Medical history significant for depression, hyperlipidemia, hypertension  TEST/EVENTS :  /2021 CT chestSmall partially loculated pleural effusions at each lung base. Associated mild bibasilar atelectasis. No evidence of pneumonia or pulmonary edema. 2. No pulmonary embolism seen.  06/2019 CAT score 13  High-resolution CT chest August 11, 2019 - for interstitial lung disease, mild diffuse bilateral bronchial wall thickening with innumerable tiny centrilobular nodules most numerous and the lung apices consistent with a smoking-related bronchitis/respiratory bronchiolitis, 3 mm right upper lobe nodule. Atherosclerosis noted, previous pleural effusions resolved  2D echo July 18, 2019 EF 65 to 40%, grade 1 diastolic dysfunction,negative for apical thrombus. Normal pulmonary artery systolic pressure.  Autoimmune/connective tissue labs negative except for sed rate elevated at 66and ANA positive ,1: 40, cytoplasmic, CCP negative  SARS-CoV-2 IgG negative igh-resolution CT chest done on August 11, 2019 showed negative for interstitial lung disease.  Positive for bronchial wall thickening and innumerable pulmonary nodules in the lung apices.  Felt consistent with smoking-related bronchitis/bronchiolitis.  Right upper lobe 3 mm nodule.  Previous pleural effusions were resolved.   2D echo showed a preserved EF and normal pulmonary artery pressures with grade 1 diastolic dysfunction.  Pulmonary function testing September 13, 2019 moderate obstruction with FEV1 at 64%, ratio 67, FVC 71%, significant bronchodilator response.  02/08/2020 Follow up : COPD  Patient returns for a follow-up visit.  Patient was hospitalized  in December 2021 for COPD exacerbation with pneumonia.  Patient says she is doing better.  Cough and congestion are starting to decrease.  She is starting to get slowly back some of her energy.  Patient does request to return back to Spiriva as she cannot afford Stiolto. Hospital records showed a negative COVID-19, influenza.  CT scan showed a multifocal pneumonia.  She was treated with IV antibiotics steroids and nebulized bronchodilators.  She was discharged on Levaquin for an additional 5 days and a prednisone taper.  Patient has completed a full course. Patient has quit smoking since discharge.  She was congratulated on her cessation Appetite is slowly starting to get better.  She denies any hemoptysis chest pain orthopnea PND or increased leg swelling.   Allergies  Allergen Reactions  . Crestor [Rosuvastatin]     Unknown  . Oxycodone Itching  . Fosamax [Alendronate]     Muscles spasms and cramps  . Penicillins Itching, Rash and Other (See Comments)    Did it involve swelling of the face/tongue/throat, SOB, or low BP? No Did it involve sudden or severe rash/hives, skin peeling, or any reaction on the inside of your mouth or nose? Yes Did you need to seek medical attention at a hospital or doctor's office? Yes When did it last happen? Long time ago If all above answers are "NO", may proceed with cephalosporin use.   . Statins     Muscle spasms and cramps  . Sulfonamide Derivatives Rash    Immunization History  Administered Date(s) Administered  . Influenza Split 11/13/2011, 10/12/2012, 09/25/2019  . Influenza, High Dose Seasonal PF 09/13/2018  . Influenza,inj,quad, With Preservative 09/26/2016  . Pneumococcal Conjugate-13 03/22/2015  . Pneumococcal Polysaccharide-23 01/04/2012, 05/21/2016  . Zoster 09/25/2019    Past Medical History:  Diagnosis Date  .  Anxiety   . Arthritis    osteoarthritis  . Bronchitis   . Cataract   . Complication of anesthesia    s/p bladder  retention-"only time it happened"  . COPD (chronic obstructive pulmonary disease) ([place])   . Depression   . Generalized headaches   . Heart murmur    heard occ  . Hiatal hernia    small- no problems  . History of blood in urine    microscopic -no problems identified," occ." high protein in urine"  . History of goiter   . Hyperlipemia   . Hypertension   . Osteoporosis   . Sigmoid diverticulosis    pt unaware  . Subcutaneous mass    RIGHT FKANK AND LEFT LATERAL CHEST WALL  . Wears glasses   . Wears glasses   . Wears partial dentures    Upper and lower    Tobacco History: Social History   Tobacco Use  Smoking Status Former Smoker  . Packs/day: 1.00  . Years: 45.00  . Pack years: 45.00  . Types: Cigarettes  . Quit date: 01/12/2019  . Years since quitting: 1.0  Smokeless Tobacco Never Used  Tobacco Comment   stopped 01/12/20   Counseling given: Not Answered Comment: stopped 01/12/20   Outpatient Medications Prior to Visit  Medication Sig Dispense Refill  . acetaminophen (TYLENOL) 500 MG tablet Take 1,000 mg by mouth every 6 (six) hours as needed for moderate pain or headache.     . albuterol (VENTOLIN HFA) 108 (90 Base) MCG/ACT inhaler Inhale 2 puffs into the lungs every 4 (four) hours as needed for wheezing or shortness of breath.    . Calcium Carb-Cholecalciferol (CALCIUM 600+D3 PO) Take 1 tablet by mouth daily.    . carvedilol (COREG) 6.25 MG tablet Take 1 tablet (6.25 mg total) by mouth 2 (two) times daily with a meal. 60 tablet 2  . Cholecalciferol (VITAMIN D) 1000 UNITS capsule Take 1,000 Units by mouth every morning.    . Coenzyme Q10 (COQ10) 100 MG CAPS Take 100 mg by mouth daily.    Marland Kitchen denosumab (PROLIA) 60 MG/ML SOLN injection Inject 60 mg into the skin every 6 (six) months. Administer in upper arm, thigh, or abdomen    . LIVALO 2 MG TABS Take 1 tablet by mouth daily.     . Magnesium 250 MG TABS Take 250 mg by mouth at bedtime.    . naproxen sodium (ALEVE) 220  MG tablet Take 220 mg by mouth as needed (For pain).    . nicotine (NICODERM CQ - DOSED IN MG/24 HOURS) 14 mg/24hr patch Place 14 mg onto the skin daily.    Marland Kitchen venlafaxine XR (EFFEXOR-XR) 75 MG 24 hr capsule Take 75 mg by mouth daily.    . Tiotropium Bromide-Olodaterol (STIOLTO RESPIMAT) 2.5-2.5 MCG/ACT AERS Inhale 2 puffs into the lungs daily. (Patient not taking: Reported on 02/08/2020) 4 g 6  . predniSONE (DELTASONE) 10 MG tablet Take 6 tabs twice a day for 2 days, then take 4 tabs twice a day for 2 days, then take 4 tabs daily for 2 days, then take 3 tabs daily for 2 days, then take 2 tabs daily for 2 days, then take 1 tab daily for 2 days, then STOP. (Patient not taking: Reported on 02/08/2020) 60 tablet 0   No facility-administered medications prior to visit.     Review of Systems:   Constitutional:   No  weight loss, night sweats,  Fevers, chills,  +fatigue, or  lassitude.  HEENT:   No headaches,  Difficulty swallowing,  Tooth/dental problems, or  Sore throat,                No sneezing, itching, ear ache, nasal congestion, post nasal drip,   CV:  No chest pain,  Orthopnea, PND, swelling in lower extremities, anasarca, dizziness, palpitations, syncope.   GI  No heartburn, indigestion, abdominal pain, nausea, vomiting, diarrhea, change in bowel habits, loss of appetite, bloody stools.   Resp:    No chest wall deformity  Skin: no rash or lesions.  GU: no dysuria, change in color of urine, no urgency or frequency.  No flank pain, no hematuria   MS:  No joint pain or swelling.  No decreased range of motion.  No back pain.    Physical Exam  BP 110/66 (BP Location: Left Arm, Patient Position: Sitting, Cuff Size: Normal)   Pulse 94   Temp (!) 97.5 F (36.4 C) (Temporal)   Ht 4' 10.5" (1.486 m)   Wt 121 lb 12.8 oz (55.2 kg)   SpO2 94%   BMI 25.02 kg/m   GEN: A/Ox3; pleasant , NAD, well nourished    HEENT:  [place]/AT,   , NOSE-clear, THROAT-clear, no lesions, no postnasal drip or  exudate noted.   NECK:  Supple w/ fair ROM; no JVD; normal carotid impulses w/o bruits; no thyromegaly or nodules palpated; no lymphadenopathy.    RESP  Clear  P & A; w/o, wheezes/ rales/ or rhonchi. no accessory muscle use, no dullness to percussion  CARD:  RRR, no m/r/g, no peripheral edema, pulses intact, no cyanosis or clubbing.  GI:   Soft & nt; nml bowel sounds; no organomegaly or masses detected.   Musco: Warm bil, no deformities or joint swelling noted.   Neuro: alert, no focal deficits noted.    Skin: Warm, no lesions or rashes    Lab Results:  CBC  BNP  ProBNP No results found for: PROBNP  Imaging: DG Chest 2 View  Result Date: 01/12/2020 CLINICAL DATA:  Cough EXAM: CHEST - 2 VIEW COMPARISON:  01/10/2020 FINDINGS: Interstitial prominence and peribronchial thickening. Right basilar airspace opacity. No confluent opacity on the left. No effusions. Heart is normal size. No acute bony abnormality. IMPRESSION: Bronchitic changes.  Right base atelectasis or infiltrate. Electronically Signed   By: Charlett Nose M.D.   On: 01/12/2020 12:23   CT ANGIO CHEST PE W OR WO CONTRAST  Result Date: 01/12/2020 CLINICAL DATA:  Pulmonary embolus suspected with high probability. Worsening shortness of breath over the last few days. Elevated D-dimer. Fever and body aches. EXAM: CT ANGIOGRAPHY CHEST WITH CONTRAST TECHNIQUE: Multidetector CT imaging of the chest was performed using the standard protocol during bolus administration of intravenous contrast. Multiplanar CT image reconstructions and MIPs were obtained to evaluate the vascular anatomy. CONTRAST:  OMNIPAQUE IOHEXOL 350 MG/ML SOLN COMPARISON:  08/11/2019 and 05/12/2019 FINDINGS: Cardiovascular: There is good opacification of the central and segmental pulmonary arteries. No focal filling defects. No evidence of significant pulmonary embolus. The heart size is normal. No pericardial effusions. Normal caliber thoracic aorta. No  aortic dissection. Calcification in the aorta and coronary arteries. Great vessel origins are patent. Mediastinum/Nodes: Scattered mediastinal lymph nodes are not pathologically enlarged. No significant lymphadenopathy. Esophagus is decompressed. Lungs/Pleura: Motion artifact limits examination but there is evidence of peribronchial thickening with patchy peribronchovascular interstitial changes and tree-in-bud infiltrates with focal consolidation in the right middle lung. Changes likely represent multifocal bronchopneumonia. No pleural  effusions. No pneumothorax. Airways are patent. Upper Abdomen: Diffuse fatty infiltration of the liver. No acute abnormalities demonstrated in the visualized upper abdomen. Musculoskeletal: No chest wall abnormality. No acute or significant osseous findings. Review of the MIP images confirms the above findings. IMPRESSION: 1. No evidence of significant pulmonary embolus. 2. Peribronchial thickening with patchy peribronchovascular interstitial changes and tree-in-bud infiltrates with focal consolidation in the right middle lung. Changes likely represent multifocal bronchopneumonia. 3. Diffuse fatty infiltration of the liver. 4. Aortic atherosclerosis. Aortic Atherosclerosis (ICD10-I70.0). Electronically Signed   By: Lucienne Capers M.D.   On: 01/12/2020 20:13   DG Chest Portable 1 View  Result Date: 01/10/2020 CLINICAL DATA:  Cough and fever.  COVID exposure. EXAM: PORTABLE CHEST 1 VIEW COMPARISON:  Radiograph 06/09/2019. FINDINGS: The cardiomediastinal contours are normal. Mild bronchial thickening. Pulmonary vasculature is normal. No consolidation, pleural effusion, or pneumothorax. No acute osseous abnormalities are seen. IMPRESSION: Mild bronchial thickening. No consolidation to suggest pneumonia. Electronically Signed   By: Keith Rake M.D.   On: 01/10/2020 16:42   VAS [place] LOWER EXTREMITY VENOUS (DVT)  Result Date: 01/13/2020  Lower Venous DVT Study Indications: SOB,  edema.  Comparison Study: 05-13-2019 Prior LT lower venous study available. Performing Technologist: Darlin Coco RDMS  Examination Guidelines: A complete evaluation includes B-mode imaging, spectral Doppler, color Doppler, and power Doppler as needed of all accessible portions of each vessel. Bilateral testing is considered an integral part of a complete examination. Limited examinations for reoccurring indications may be performed as noted. The reflux portion of the exam is performed with the patient in reverse Trendelenburg.  +---------+---------------+---------+-----------+----------+--------------+ RIGHT    CompressibilityPhasicitySpontaneityPropertiesThrombus Aging +---------+---------------+---------+-----------+----------+--------------+ CFV      Full           Yes      Yes                                 +---------+---------------+---------+-----------+----------+--------------+ SFJ      Full                                                        +---------+---------------+---------+-----------+----------+--------------+ FV Prox  Full                                                        +---------+---------------+---------+-----------+----------+--------------+ FV Mid   Full                                                        +---------+---------------+---------+-----------+----------+--------------+ FV DistalFull                                                        +---------+---------------+---------+-----------+----------+--------------+ PFV      Full                                                        +---------+---------------+---------+-----------+----------+--------------+  POP      Full           Yes      Yes                                 +---------+---------------+---------+-----------+----------+--------------+ PTV      Full                                                         +---------+---------------+---------+-----------+----------+--------------+ PERO     Full                                                        +---------+---------------+---------+-----------+----------+--------------+   +---------+---------------+---------+-----------+----------+--------------+ LEFT     CompressibilityPhasicitySpontaneityPropertiesThrombus Aging +---------+---------------+---------+-----------+----------+--------------+ CFV      Full           Yes      Yes                                 +---------+---------------+---------+-----------+----------+--------------+ SFJ      Full                                                        +---------+---------------+---------+-----------+----------+--------------+ FV Prox  Full                                                        +---------+---------------+---------+-----------+----------+--------------+ FV Mid   Full                                                        +---------+---------------+---------+-----------+----------+--------------+ FV DistalFull                                                        +---------+---------------+---------+-----------+----------+--------------+ PFV      Full                                                        +---------+---------------+---------+-----------+----------+--------------+ POP      Full           Yes      Yes                                 +---------+---------------+---------+-----------+----------+--------------+  PTV      Full                                                        +---------+---------------+---------+-----------+----------+--------------+ PERO     Full                                                        +---------+---------------+---------+-----------+----------+--------------+     Summary: RIGHT: - There is no evidence of deep vein thrombosis in the lower extremity.  - No cystic structure found in  the popliteal fossa.  LEFT: - There is no evidence of deep vein thrombosis in the lower extremity.  - No cystic structure found in the popliteal fossa.  *See table(s) above for measurements and observations. Electronically signed by Ruta Hinds MD on 01/13/2020 at 9:55:29 AM.    Final       PFT Results Latest Ref Rng & Units 09/13/2019  FVC-Pre L 1.26  FVC-Predicted Pre % 51  FVC-Post L 1.75  FVC-Predicted Post % 71  Pre FEV1/FVC % % 72  Post FEV1/FCV % % 67  FEV1-Pre L 0.91  FEV1-Predicted Pre % 49  FEV1-Post L 1.18  DLCO uncorrected ml/min/mmHg 15.57  DLCO UNC% % 95  DLCO corrected ml/min/mmHg 15.04  DLCO COR %Predicted % 91  DLVA Predicted % 99  TLC L 4.23  TLC % Predicted % 98  RV % Predicted % 142    No results found for: NITRICOXIDE      Assessment & Plan:   No problem-specific Assessment & Plan notes found for this encounter.     Rexene Edison, NP 02/08/2020

## 2020-02-08 NOTE — Telephone Encounter (Signed)
Called and spoke with patient who was questioning if she needs to be on an antibiotic from her CXR. Advised patient that at this time Tammy was not recommending anything and that the PNA was resolving. Patient stated that she has not been on an antibiotic since Jan 3rd when she got home from hospital. Asked patient if she was feeling better and she stated yes. Advised patient that if she started to feel worse, having fever or anything along those lines to call the office but that this time she was going in the right direction and nothing needed to be done at this time. Patient expressed understanding. Nothing further needed at this time.     Chest xray shows near complete resolution of right sided PNA  Cont w/ ov recs Please contact office for sooner follow up if symptoms do not improve or worsen or seek emergency care

## 2020-02-08 NOTE — Progress Notes (Signed)
Called and spoke with patient, advised of the results and recommendations per Tammy.  She verbalized understanding.  Nothing further needed.

## 2020-02-08 NOTE — Patient Instructions (Signed)
[facility] , then go back to Spiriva 2 puffs daily - rinse after use.  Ventolin inhaler As needed  Wheezing /shortness of breath.  Activity as tolerated.  Great job on not smoking.  Chest xray today .  Follow up with Dr. Chase Caller or Catilyn Boggus  in 4 months  and As needed

## 2020-02-12 NOTE — Assessment & Plan Note (Signed)
Patient is congratulated on smoking cessation.

## 2020-02-12 NOTE — Assessment & Plan Note (Signed)
Recent exacerbation with pneumonia requiring hospitalization.  Patient is clinically making a slow recovery.  She is congratulated on smoking cessation.  We will check chest x-ray today for follow-up of clearance of pneumonia  Plan  Patient Instructions  [facility] , then go back to Spiriva 2 puffs daily - rinse after use.  Ventolin inhaler As needed  Wheezing /shortness of breath.  Activity as tolerated.  Great job on not smoking.  Chest xray today .  Follow up with Dr. Chase Caller or Abdulraheem Pineo  in 4 months  and As needed

## 2020-02-12 NOTE — Assessment & Plan Note (Signed)
Recent pneumonia requiring hospitalization.  Clinically is improving after antibiotics.  Will check chest x-ray today for pneumonia clearance  Plan  Patient Instructions  [facility] , then go back to Spiriva 2 puffs daily - rinse after use.  Ventolin inhaler As needed  Wheezing /shortness of breath.  Activity as tolerated.  Great job on not smoking.  Chest xray today .  Follow up with Dr. Chase Caller or Blia Totman  in 4 months  and As needed       '

## 2020-02-20 DIAGNOSIS — H26493 Other secondary cataract, bilateral: Secondary | ICD-10-CM | POA: Diagnosis not present

## 2020-03-07 DIAGNOSIS — H26492 Other secondary cataract, left eye: Secondary | ICD-10-CM | POA: Diagnosis not present

## 2020-03-07 DIAGNOSIS — Z961 Presence of intraocular lens: Secondary | ICD-10-CM | POA: Diagnosis not present

## 2020-03-07 DIAGNOSIS — H02831 Dermatochalasis of right upper eyelid: Secondary | ICD-10-CM | POA: Diagnosis not present

## 2020-03-07 DIAGNOSIS — H18413 Arcus senilis, bilateral: Secondary | ICD-10-CM | POA: Diagnosis not present

## 2020-03-07 DIAGNOSIS — H26493 Other secondary cataract, bilateral: Secondary | ICD-10-CM | POA: Diagnosis not present

## 2020-03-25 ENCOUNTER — Telehealth: Payer: Self-pay | Admitting: Internal Medicine

## 2020-03-25 MED ORDER — SPIRIVA RESPIMAT 2.5 MCG/ACT IN AERS: 2.0000 | INHALATION_SPRAY | Freq: Every day | RESPIRATORY_TRACT | 0 refills | Status: DC

## 2020-03-25 NOTE — Telephone Encounter (Signed)
I have called the pt and she is aware of 2 samples of the spiriva that have been left up front for the pt.

## 2020-04-04 DIAGNOSIS — J449 Chronic obstructive pulmonary disease, unspecified: Secondary | ICD-10-CM | POA: Diagnosis not present

## 2020-04-04 DIAGNOSIS — F322 Major depressive disorder, single episode, severe without psychotic features: Secondary | ICD-10-CM | POA: Diagnosis not present

## 2020-04-04 DIAGNOSIS — E782 Mixed hyperlipidemia: Secondary | ICD-10-CM | POA: Diagnosis not present

## 2020-04-04 DIAGNOSIS — Z720 Tobacco use: Secondary | ICD-10-CM | POA: Diagnosis not present

## 2020-04-04 DIAGNOSIS — N183 Chronic kidney disease, stage 3 unspecified: Secondary | ICD-10-CM | POA: Diagnosis not present

## 2020-04-04 DIAGNOSIS — E890 Postprocedural hypothyroidism: Secondary | ICD-10-CM | POA: Diagnosis not present

## 2020-04-04 DIAGNOSIS — R7301 Impaired fasting glucose: Secondary | ICD-10-CM | POA: Diagnosis not present

## 2020-04-04 DIAGNOSIS — M810 Age-related osteoporosis without current pathological fracture: Secondary | ICD-10-CM | POA: Diagnosis not present

## 2020-04-04 DIAGNOSIS — I129 Hypertensive chronic kidney disease with stage 1 through stage 4 chronic kidney disease, or unspecified chronic kidney disease: Secondary | ICD-10-CM | POA: Diagnosis not present

## 2020-04-26 DIAGNOSIS — G4733 Obstructive sleep apnea (adult) (pediatric): Secondary | ICD-10-CM | POA: Diagnosis not present

## 2020-04-27 DIAGNOSIS — R0981 Nasal congestion: Secondary | ICD-10-CM | POA: Diagnosis not present

## 2020-04-27 DIAGNOSIS — R059 Cough, unspecified: Secondary | ICD-10-CM | POA: Diagnosis not present

## 2020-04-27 DIAGNOSIS — Z8709 Personal history of other diseases of the respiratory system: Secondary | ICD-10-CM | POA: Diagnosis not present

## 2020-04-27 DIAGNOSIS — Z03818 Encounter for observation for suspected exposure to other biological agents ruled out: Secondary | ICD-10-CM | POA: Diagnosis not present

## 2020-04-27 DIAGNOSIS — J40 Bronchitis, not specified as acute or chronic: Secondary | ICD-10-CM | POA: Diagnosis not present

## 2020-05-13 DIAGNOSIS — G4733 Obstructive sleep apnea (adult) (pediatric): Secondary | ICD-10-CM | POA: Diagnosis not present

## 2020-05-15 ENCOUNTER — Other Ambulatory Visit: Payer: Self-pay | Admitting: Family Medicine

## 2020-05-15 DIAGNOSIS — Z1231 Encounter for screening mammogram for malignant neoplasm of breast: Secondary | ICD-10-CM

## 2020-06-18 DIAGNOSIS — J441 Chronic obstructive pulmonary disease with (acute) exacerbation: Secondary | ICD-10-CM | POA: Diagnosis not present

## 2020-07-05 DIAGNOSIS — E782 Mixed hyperlipidemia: Secondary | ICD-10-CM | POA: Diagnosis not present

## 2020-07-08 ENCOUNTER — Other Ambulatory Visit: Payer: Self-pay

## 2020-07-08 ENCOUNTER — Ambulatory Visit
Admission: RE | Admit: 2020-07-08 | Discharge: 2020-07-08 | Disposition: A | Discharge status: Home / Self Care | Payer: PPO | Source: Ambulatory Visit | Attending: Family Medicine | Admitting: Family Medicine

## 2020-07-08 DIAGNOSIS — Z1231 Encounter for screening mammogram for malignant neoplasm of breast: Secondary | ICD-10-CM

## 2020-07-25 ENCOUNTER — Other Ambulatory Visit: Payer: Self-pay | Admitting: *Deleted

## 2020-07-25 DIAGNOSIS — F17210 Nicotine dependence, cigarettes, uncomplicated: Secondary | ICD-10-CM

## 2020-07-25 DIAGNOSIS — Z87891 Personal history of nicotine dependence: Secondary | ICD-10-CM

## 2020-08-16 DIAGNOSIS — G4733 Obstructive sleep apnea (adult) (pediatric): Secondary | ICD-10-CM | POA: Diagnosis not present

## 2020-08-30 ENCOUNTER — Telehealth: Payer: Self-pay | Admitting: Internal Medicine

## 2020-08-30 MED ORDER — SPIRIVA RESPIMAT 2.5 MCG/ACT IN AERS: 2.0000 | INHALATION_SPRAY | Freq: Every day | RESPIRATORY_TRACT | 0 refills | Status: DC

## 2020-08-30 NOTE — Telephone Encounter (Signed)
Call made to patient, confirmed DOB. Made aware we will place samples upfront but we will also be placing a patient assistance application in the bag as well as we may not always have samples available to give nor can we guarantee we can give them for the remainder of the year. Aware to fill out application and bring it back to [place] once complete. Voiced understanding.   Nothing further needed at this time.

## 2020-09-04 ENCOUNTER — Other Ambulatory Visit: Payer: Self-pay

## 2020-09-04 ENCOUNTER — Ambulatory Visit (HOSPITAL_BASED_OUTPATIENT_CLINIC_OR_DEPARTMENT_OTHER)
Admission: RE | Admit: 2020-09-04 | Discharge: 2020-09-04 | Disposition: A | Discharge status: Home / Self Care | Payer: PPO | Source: Ambulatory Visit | Attending: Acute Care | Admitting: Acute Care

## 2020-09-04 ENCOUNTER — Telehealth (INDEPENDENT_AMBULATORY_CARE_PROVIDER_SITE_OTHER): Payer: PPO | Admitting: Acute Care

## 2020-09-04 ENCOUNTER — Encounter: Payer: Self-pay | Admitting: Acute Care

## 2020-09-04 DIAGNOSIS — Z87891 Personal history of nicotine dependence: Secondary | ICD-10-CM | POA: Insufficient documentation

## 2020-09-04 DIAGNOSIS — F17210 Nicotine dependence, cigarettes, uncomplicated: Secondary | ICD-10-CM | POA: Diagnosis not present

## 2020-09-04 NOTE — Patient Instructions (Signed)
Thank you for participating in the [facility]. It was our pleasure to meet you today. We will call you with the results of your scan within the next few days. Your scan will be assigned a Lung RADS category score by the physicians reading the scans.  This Lung RADS score determines follow up scanning.  See below for description of categories, and follow up screening recommendations. We will be in touch to schedule your follow up screening annually or based on recommendations of our providers. We will fax a copy of your scan results to your Primary Care Physician, or the physician who referred you to the program, to ensure they have the results. Please call the office if you have any questions or concerns regarding your scanning experience or results.  Our office number is [phone]. Please speak with Denise Phelps, RN. She is our Lung Cancer Screening RN. If she is unavailable when you call, please have the office staff send her a message. She will return your call at her earliest convenience. Remember, if your scan is normal, we will scan you annually as long as you continue to meet the criteria for the program. (Age 55-77, Current smoker or smoker who has quit within the last 15 years). If you are a smoker, remember, quitting is the single most powerful action that you can take to decrease your risk of lung cancer and other pulmonary, breathing related problems. We know quitting is hard, and we are here to help.  Please let us know if there is anything we can do to help you meet your goal of quitting. If you are a former smoker, congratulations. We are proud of you! Remain smoke free! Remember you can refer friends or family members through the number above.  We will screen them to make sure they meet criteria for the program. Thank you for helping us take better care of you by participating in Lung Screening.  Lung RADS Categories:  Lung RADS 1: no nodules  or definitely non-concerning nodules.  Recommendation is for a repeat annual scan in 12 months.  Lung RADS 2:  nodules that are non-concerning in appearance and behavior with a very low likelihood of becoming an active cancer. Recommendation is for a repeat annual scan in 12 months.  Lung RADS 3: nodules that are probably non-concerning , includes nodules with a low likelihood of becoming an active cancer.  Recommendation is for a 6-month repeat screening scan. Often noted after an upper respiratory illness. We will be in touch to make sure you have no questions, and to schedule your 6-month scan.  Lung RADS 4 A: nodules with concerning findings, recommendation is most often for a follow up scan in 3 months or additional testing based on our provider's assessment of the scan. We will be in touch to make sure you have no questions and to schedule the recommended 3 month follow up scan.  Lung RADS 4 B:  indicates findings that are concerning. We will be in touch with you to schedule additional diagnostic testing based on our provider's  assessment of the scan.   

## 2020-09-04 NOTE — Progress Notes (Signed)
Virtual Visit via Video Note  I connected with Kimberly [name] on 09/04/20 at 10:00 AM EDT by a video enabled telemedicine application and verified that I am speaking with the correct person using two identifiers.  Location: Patient: At home Provider: [place], [place], [place], [address]    I discussed the limitations of evaluation and management by telemedicine and the availability of in person appointments. The patient expressed understanding and agreed to proceed.  Shared Decision Making Visit Lung Cancer Screening Program [phone])   Eligibility: Age 70 y.o. Pack Years Smoking History Calculation 57 pack year smoking history (# packs/per year x # years smoked) Recent History of coughing up blood  no Unexplained weight loss? no ( >Than 15 pounds within the last 6 months ) Prior History Lung / other cancer no (Diagnosis within the last 5 years already requiring surveillance chest CT Scans). Smoking Status Former Smoker Former Smokers: Years since quit: Less than 1 year  Quit Date: 08/17/2020  Visit Components: Discussion included one or more decision making aids. yes Discussion included risk/benefits of screening. yes Discussion included potential follow up diagnostic testing for abnormal scans. yes Discussion included meaning and risk of over diagnosis. yes Discussion included meaning and risk of False Positives. yes Discussion included meaning of total radiation exposure. yes  Counseling Included: Importance of adherence to annual lung cancer LDCT screening. yes Impact of comorbidities on ability to participate in the program. yes Ability and willingness to under diagnostic treatment. yes  Smoking Cessation Counseling: Current Smokers:  Discussed importance of smoking cessation. yes Information about tobacco cessation classes and interventions provided to patient. yes Patient provided with "ticket" for LDCT Scan. yes Symptomatic Patient.  no  Counseling Diagnosis Code: Tobacco Use Z72.0 Asymptomatic Patient yes  Counseling (Intermediate counseling: > three minutes counseling) [id number] Former Smokers:  Discussed the importance of maintaining cigarette abstinence. yes Diagnosis Code: Personal History of Nicotine Dependence. [id number] Information about tobacco cessation classes and interventions provided to patient. Yes Patient provided with "ticket" for LDCT Scan. yes Written Order for Lung Cancer Screening with LDCT placed in Epic. Yes (CT Chest Lung Cancer Screening Low Dose W/O CM) [id number] Z12.2-Screening of respiratory organs Z87.891-Personal history of nicotine dependence Pt quit smoking August 17, 2020 She is using 14 mg patches with success.  She stated she was tired of not being able to breath.  I have told her I am very proud of her.    Magdalen Spatz, NP 09/04/2020

## 2020-09-05 ENCOUNTER — Other Ambulatory Visit: Payer: Self-pay | Admitting: Adult Health

## 2020-09-10 DIAGNOSIS — B351 Tinea unguium: Secondary | ICD-10-CM | POA: Diagnosis not present

## 2020-09-10 DIAGNOSIS — I129 Hypertensive chronic kidney disease with stage 1 through stage 4 chronic kidney disease, or unspecified chronic kidney disease: Secondary | ICD-10-CM | POA: Diagnosis not present

## 2020-09-10 DIAGNOSIS — Z23 Encounter for immunization: Secondary | ICD-10-CM | POA: Diagnosis not present

## 2020-09-10 DIAGNOSIS — R590 Localized enlarged lymph nodes: Secondary | ICD-10-CM | POA: Diagnosis not present

## 2020-09-10 DIAGNOSIS — E782 Mixed hyperlipidemia: Secondary | ICD-10-CM | POA: Diagnosis not present

## 2020-09-10 DIAGNOSIS — R252 Cramp and spasm: Secondary | ICD-10-CM | POA: Diagnosis not present

## 2020-09-13 ENCOUNTER — Telehealth: Payer: Self-pay | Admitting: Acute Care

## 2020-09-13 DIAGNOSIS — F17210 Nicotine dependence, cigarettes, uncomplicated: Secondary | ICD-10-CM

## 2020-09-13 DIAGNOSIS — Z87891 Personal history of nicotine dependence: Secondary | ICD-10-CM

## 2020-09-13 NOTE — Telephone Encounter (Signed)
Pt informed of CT results per Eric Form, NP.  PT verbalized understanding.  Copy of CT sent to PCP.  Order placed for 1 yr f/u CT. Pt will f/u with PCP regarding enlarged axillary node.

## 2020-09-16 NOTE — Progress Notes (Signed)
Please call patient and let them  know their  low dose Ct was read as a Lung RADS 1, negative study: no nodules or definitely benign nodules. Radiology recommendation is for a repeat LDCT in 12 months. .Please let them  know we will order and schedule their  annual screening scan for 08/2021. Please let them  know there was notation of CAD on their  scan.  Please remind the patient  that this is a non-gated exam therefore degree or severity of disease  cannot be determined. Please have them  follow up with their PCP regarding potential risk factor modification, dietary therapy or pharmacologic therapy if clinically indicated. Pt.  is not  currently on statin therapy. Please place order for annual  screening scan for  08/2021 and fax results to PCP. Thanks so much.  Please ask patient if she has had a recent Covid or other vaccine in her L arm. She needs to follow up with PCP regarding the slightly enlarged lymph node in her L arm. Thanks so much

## 2020-09-17 ENCOUNTER — Telehealth: Payer: Self-pay | Admitting: Adult Health

## 2020-09-17 NOTE — Telephone Encounter (Signed)
Pt came into clinic today (09/17/2020) and dropped off patient assistance forms and they were placed in NP Parrett's box. Pls regards; [phone].

## 2020-09-19 DIAGNOSIS — M1712 Unilateral primary osteoarthritis, left knee: Secondary | ICD-10-CM | POA: Diagnosis not present

## 2020-09-19 DIAGNOSIS — M1711 Unilateral primary osteoarthritis, right knee: Secondary | ICD-10-CM | POA: Diagnosis not present

## 2020-09-19 DIAGNOSIS — M170 Bilateral primary osteoarthritis of knee: Secondary | ICD-10-CM | POA: Diagnosis not present

## 2020-09-23 ENCOUNTER — Other Ambulatory Visit: Payer: Self-pay | Admitting: *Deleted

## 2020-09-23 MED ORDER — SPIRIVA RESPIMAT 2.5 MCG/ACT IN AERS
2.0000 | INHALATION_SPRAY | Freq: Every day | RESPIRATORY_TRACT | 3 refills | Status: DC
Start: 2020-09-23 — End: 2020-09-25

## 2020-09-23 NOTE — Telephone Encounter (Signed)
Received paperwork for patient assistance for Spiriva Respimat 2.5, forms filled out and script printed for signature.  Awaiting signature from Rexene Edison NP.

## 2020-09-24 ENCOUNTER — Other Ambulatory Visit: Payer: Self-pay | Admitting: Internal Medicine

## 2020-09-24 DIAGNOSIS — R590 Localized enlarged lymph nodes: Secondary | ICD-10-CM | POA: Diagnosis not present

## 2020-09-25 MED ORDER — SPIRIVA RESPIMAT 2.5 MCG/ACT IN AERS
2.0000 | INHALATION_SPRAY | Freq: Every day | RESPIRATORY_TRACT | 3 refills | Status: DC
Start: 2020-09-25 — End: 2022-05-07

## 2020-10-14 NOTE — Telephone Encounter (Signed)
Paperwork has been faxed to manufacturer.

## 2020-10-18 DIAGNOSIS — R7303 Prediabetes: Secondary | ICD-10-CM | POA: Diagnosis not present

## 2020-10-18 DIAGNOSIS — R319 Hematuria, unspecified: Secondary | ICD-10-CM | POA: Diagnosis not present

## 2020-10-18 DIAGNOSIS — F322 Major depressive disorder, single episode, severe without psychotic features: Secondary | ICD-10-CM | POA: Diagnosis not present

## 2020-10-18 DIAGNOSIS — E782 Mixed hyperlipidemia: Secondary | ICD-10-CM | POA: Diagnosis not present

## 2020-10-18 DIAGNOSIS — N183 Chronic kidney disease, stage 3 unspecified: Secondary | ICD-10-CM | POA: Diagnosis not present

## 2020-10-18 DIAGNOSIS — Z0000 Encounter for general adult medical examination without abnormal findings: Secondary | ICD-10-CM | POA: Diagnosis not present

## 2020-10-18 DIAGNOSIS — I129 Hypertensive chronic kidney disease with stage 1 through stage 4 chronic kidney disease, or unspecified chronic kidney disease: Secondary | ICD-10-CM | POA: Diagnosis not present

## 2020-10-18 DIAGNOSIS — M810 Age-related osteoporosis without current pathological fracture: Secondary | ICD-10-CM | POA: Diagnosis not present

## 2020-10-18 DIAGNOSIS — E890 Postprocedural hypothyroidism: Secondary | ICD-10-CM | POA: Diagnosis not present

## 2020-10-18 DIAGNOSIS — J449 Chronic obstructive pulmonary disease, unspecified: Secondary | ICD-10-CM | POA: Diagnosis not present

## 2020-10-18 DIAGNOSIS — G4733 Obstructive sleep apnea (adult) (pediatric): Secondary | ICD-10-CM | POA: Diagnosis not present

## 2020-10-28 ENCOUNTER — Other Ambulatory Visit: Payer: Self-pay | Admitting: Internal Medicine

## 2020-10-28 ENCOUNTER — Other Ambulatory Visit: Payer: Self-pay

## 2020-10-28 ENCOUNTER — Ambulatory Visit
Admission: RE | Admit: 2020-10-28 | Discharge: 2020-10-28 | Disposition: A | Discharge status: Home / Self Care | Payer: PPO | Source: Ambulatory Visit | Attending: Internal Medicine | Admitting: Internal Medicine

## 2020-10-28 DIAGNOSIS — R590 Localized enlarged lymph nodes: Secondary | ICD-10-CM | POA: Diagnosis not present

## 2020-10-28 DIAGNOSIS — R922 Inconclusive mammogram: Secondary | ICD-10-CM | POA: Diagnosis not present

## 2020-10-28 DIAGNOSIS — N6489 Other specified disorders of breast: Secondary | ICD-10-CM | POA: Diagnosis not present

## 2020-11-15 DIAGNOSIS — G4733 Obstructive sleep apnea (adult) (pediatric): Secondary | ICD-10-CM | POA: Diagnosis not present

## 2020-12-04 DIAGNOSIS — M542 Cervicalgia: Secondary | ICD-10-CM | POA: Diagnosis not present

## 2020-12-04 DIAGNOSIS — M25521 Pain in right elbow: Secondary | ICD-10-CM | POA: Diagnosis not present

## 2020-12-04 DIAGNOSIS — M25522 Pain in left elbow: Secondary | ICD-10-CM | POA: Diagnosis not present

## 2021-01-01 DIAGNOSIS — M25521 Pain in right elbow: Secondary | ICD-10-CM | POA: Diagnosis not present

## 2021-01-01 DIAGNOSIS — M25522 Pain in left elbow: Secondary | ICD-10-CM | POA: Diagnosis not present

## 2021-02-24 DIAGNOSIS — H16223 Keratoconjunctivitis sicca, not specified as Sjogren's, bilateral: Secondary | ICD-10-CM | POA: Diagnosis not present

## 2021-03-11 DIAGNOSIS — R590 Localized enlarged lymph nodes: Secondary | ICD-10-CM | POA: Diagnosis not present

## 2021-03-18 ENCOUNTER — Telehealth: Payer: Self-pay | Admitting: Adult Health

## 2021-03-19 DIAGNOSIS — S5001XA Contusion of right elbow, initial encounter: Secondary | ICD-10-CM | POA: Diagnosis not present

## 2021-03-19 DIAGNOSIS — M25561 Pain in right knee: Secondary | ICD-10-CM | POA: Diagnosis not present

## 2021-03-19 DIAGNOSIS — M25562 Pain in left knee: Secondary | ICD-10-CM | POA: Diagnosis not present

## 2021-03-19 DIAGNOSIS — S8002XA Contusion of left knee, initial encounter: Secondary | ICD-10-CM | POA: Diagnosis not present

## 2021-03-19 DIAGNOSIS — Z96652 Presence of left artificial knee joint: Secondary | ICD-10-CM | POA: Diagnosis not present

## 2021-03-19 MED ORDER — SPIRIVA RESPIMAT 2.5 MCG/ACT IN AERS: 2.0000 | INHALATION_SPRAY | Freq: Every day | RESPIRATORY_TRACT | 0 refills | Status: DC

## 2021-03-19 NOTE — Telephone Encounter (Signed)
I called the patient and she is aware that I can get her some samples. I have made a follow up for this patient and she is aware to keep the follow up to get more refills. Nothing further needed.  ?

## 2021-03-26 DIAGNOSIS — D1801 Hemangioma of skin and subcutaneous tissue: Secondary | ICD-10-CM | POA: Diagnosis not present

## 2021-03-26 DIAGNOSIS — D2271 Melanocytic nevi of right lower limb, including hip: Secondary | ICD-10-CM | POA: Diagnosis not present

## 2021-03-26 DIAGNOSIS — D2272 Melanocytic nevi of left lower limb, including hip: Secondary | ICD-10-CM | POA: Diagnosis not present

## 2021-03-26 DIAGNOSIS — D225 Melanocytic nevi of trunk: Secondary | ICD-10-CM | POA: Diagnosis not present

## 2021-03-26 DIAGNOSIS — D2262 Melanocytic nevi of left upper limb, including shoulder: Secondary | ICD-10-CM | POA: Diagnosis not present

## 2021-03-26 DIAGNOSIS — L308 Other specified dermatitis: Secondary | ICD-10-CM | POA: Diagnosis not present

## 2021-03-26 DIAGNOSIS — B078 Other viral warts: Secondary | ICD-10-CM | POA: Diagnosis not present

## 2021-04-03 DIAGNOSIS — M79641 Pain in right hand: Secondary | ICD-10-CM | POA: Diagnosis not present

## 2021-04-03 DIAGNOSIS — M65342 Trigger finger, left ring finger: Secondary | ICD-10-CM | POA: Diagnosis not present

## 2021-04-03 DIAGNOSIS — M65341 Trigger finger, right ring finger: Secondary | ICD-10-CM | POA: Diagnosis not present

## 2021-04-03 DIAGNOSIS — M79642 Pain in left hand: Secondary | ICD-10-CM | POA: Diagnosis not present

## 2021-04-07 DIAGNOSIS — G4733 Obstructive sleep apnea (adult) (pediatric): Secondary | ICD-10-CM | POA: Diagnosis not present

## 2021-04-14 ENCOUNTER — Ambulatory Visit: Payer: PPO | Admitting: Adult Health

## 2021-04-17 DIAGNOSIS — G4733 Obstructive sleep apnea (adult) (pediatric): Secondary | ICD-10-CM | POA: Diagnosis not present

## 2021-04-19 DIAGNOSIS — R059 Cough, unspecified: Secondary | ICD-10-CM | POA: Diagnosis not present

## 2021-04-19 DIAGNOSIS — J209 Acute bronchitis, unspecified: Secondary | ICD-10-CM | POA: Diagnosis not present

## 2021-04-19 DIAGNOSIS — R0981 Nasal congestion: Secondary | ICD-10-CM | POA: Diagnosis not present

## 2021-04-19 DIAGNOSIS — Z03818 Encounter for observation for suspected exposure to other biological agents ruled out: Secondary | ICD-10-CM | POA: Diagnosis not present

## 2021-05-01 ENCOUNTER — Other Ambulatory Visit: Payer: PPO

## 2021-05-05 ENCOUNTER — Other Ambulatory Visit: Payer: Self-pay | Admitting: Advanced Practice Midwife

## 2021-05-05 DIAGNOSIS — M810 Age-related osteoporosis without current pathological fracture: Secondary | ICD-10-CM | POA: Diagnosis not present

## 2021-05-05 DIAGNOSIS — R928 Other abnormal and inconclusive findings on diagnostic imaging of breast: Secondary | ICD-10-CM

## 2021-05-05 DIAGNOSIS — J449 Chronic obstructive pulmonary disease, unspecified: Secondary | ICD-10-CM | POA: Diagnosis not present

## 2021-05-05 DIAGNOSIS — R7301 Impaired fasting glucose: Secondary | ICD-10-CM | POA: Diagnosis not present

## 2021-05-05 DIAGNOSIS — I129 Hypertensive chronic kidney disease with stage 1 through stage 4 chronic kidney disease, or unspecified chronic kidney disease: Secondary | ICD-10-CM | POA: Diagnosis not present

## 2021-05-05 DIAGNOSIS — F3342 Major depressive disorder, recurrent, in full remission: Secondary | ICD-10-CM | POA: Diagnosis not present

## 2021-05-05 DIAGNOSIS — N183 Chronic kidney disease, stage 3 unspecified: Secondary | ICD-10-CM | POA: Diagnosis not present

## 2021-05-05 DIAGNOSIS — E782 Mixed hyperlipidemia: Secondary | ICD-10-CM | POA: Diagnosis not present

## 2021-05-07 DIAGNOSIS — N1831 Chronic kidney disease, stage 3a: Secondary | ICD-10-CM | POA: Diagnosis not present

## 2021-05-07 DIAGNOSIS — I129 Hypertensive chronic kidney disease with stage 1 through stage 4 chronic kidney disease, or unspecified chronic kidney disease: Secondary | ICD-10-CM | POA: Diagnosis not present

## 2021-06-02 ENCOUNTER — Other Ambulatory Visit: Payer: Self-pay | Admitting: Family Medicine

## 2021-06-02 DIAGNOSIS — R928 Other abnormal and inconclusive findings on diagnostic imaging of breast: Secondary | ICD-10-CM

## 2021-06-03 ENCOUNTER — Ambulatory Visit
Admission: RE | Admit: 2021-06-03 | Discharge: 2021-06-03 | Disposition: A | Discharge status: Home / Self Care | Payer: PPO | Source: Ambulatory Visit | Attending: Advanced Practice Midwife | Admitting: Advanced Practice Midwife

## 2021-06-03 ENCOUNTER — Other Ambulatory Visit: Payer: Self-pay | Admitting: Family Medicine

## 2021-06-03 DIAGNOSIS — R928 Other abnormal and inconclusive findings on diagnostic imaging of breast: Secondary | ICD-10-CM

## 2021-06-03 DIAGNOSIS — R590 Localized enlarged lymph nodes: Secondary | ICD-10-CM | POA: Diagnosis not present

## 2021-06-05 ENCOUNTER — Other Ambulatory Visit: Payer: Self-pay | Admitting: Family Medicine

## 2021-06-05 DIAGNOSIS — Z1231 Encounter for screening mammogram for malignant neoplasm of breast: Secondary | ICD-10-CM

## 2021-07-09 ENCOUNTER — Ambulatory Visit
Admission: RE | Admit: 2021-07-09 | Discharge: 2021-07-09 | Disposition: A | Discharge status: Home / Self Care | Payer: PPO | Source: Ambulatory Visit | Attending: Family Medicine | Admitting: Family Medicine

## 2021-07-09 DIAGNOSIS — Z1231 Encounter for screening mammogram for malignant neoplasm of breast: Secondary | ICD-10-CM

## 2021-07-31 DIAGNOSIS — M65341 Trigger finger, right ring finger: Secondary | ICD-10-CM | POA: Diagnosis not present

## 2021-07-31 DIAGNOSIS — M65342 Trigger finger, left ring finger: Secondary | ICD-10-CM | POA: Diagnosis not present

## 2021-08-20 DIAGNOSIS — H35342 Macular cyst, hole, or pseudohole, left eye: Secondary | ICD-10-CM | POA: Diagnosis not present

## 2021-09-04 ENCOUNTER — Ambulatory Visit (HOSPITAL_BASED_OUTPATIENT_CLINIC_OR_DEPARTMENT_OTHER)
Admission: RE | Admit: 2021-09-04 | Discharge: 2021-09-04 | Disposition: A | Discharge status: Home / Self Care | Payer: PPO | Source: Ambulatory Visit | Attending: Family Medicine | Admitting: Family Medicine

## 2021-09-04 DIAGNOSIS — F17210 Nicotine dependence, cigarettes, uncomplicated: Secondary | ICD-10-CM | POA: Insufficient documentation

## 2021-09-04 DIAGNOSIS — Z87891 Personal history of nicotine dependence: Secondary | ICD-10-CM | POA: Diagnosis not present

## 2021-09-05 DIAGNOSIS — H353131 Nonexudative age-related macular degeneration, bilateral, early dry stage: Secondary | ICD-10-CM | POA: Diagnosis not present

## 2021-09-05 DIAGNOSIS — H35342 Macular cyst, hole, or pseudohole, left eye: Secondary | ICD-10-CM | POA: Diagnosis not present

## 2021-09-08 ENCOUNTER — Other Ambulatory Visit: Payer: Self-pay

## 2021-09-08 DIAGNOSIS — Z122 Encounter for screening for malignant neoplasm of respiratory organs: Secondary | ICD-10-CM

## 2021-09-08 DIAGNOSIS — Z87891 Personal history of nicotine dependence: Secondary | ICD-10-CM

## 2021-09-08 DIAGNOSIS — F17210 Nicotine dependence, cigarettes, uncomplicated: Secondary | ICD-10-CM

## 2021-09-13 IMAGING — MG DIGITAL SCREENING BILAT W/ TOMO W/ CAD
8 series · 8 of 24 positions shown · non-contrast
Comparison: Previous exam(s).

CLINICAL DATA: Screening.

EXAM:
DIGITAL SCREENING BILATERAL MAMMOGRAM WITH TOMO AND CAD

[L MLO synth-2D]
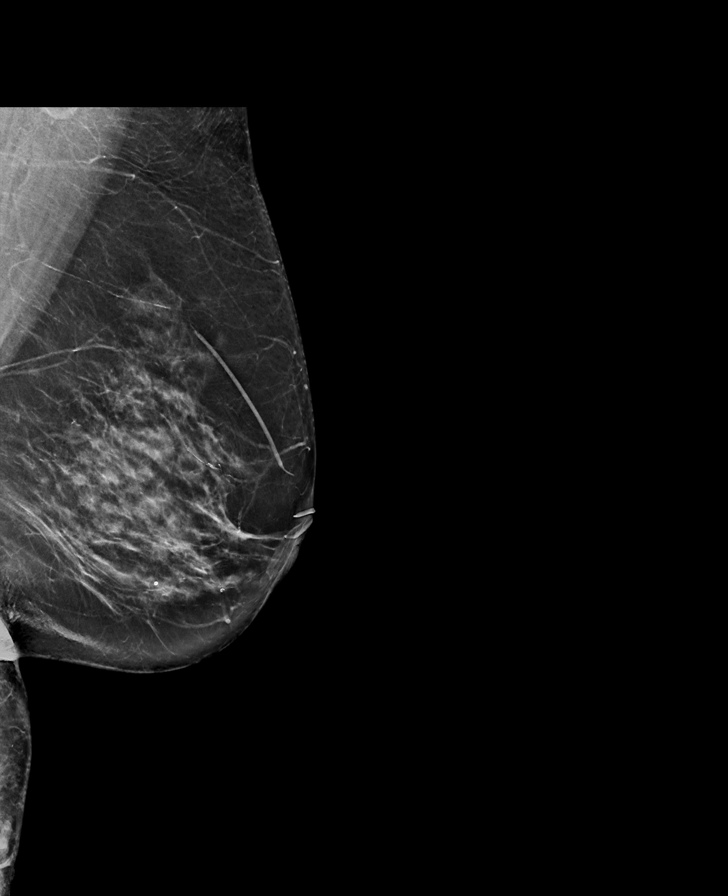

[R MLO synth-2D]
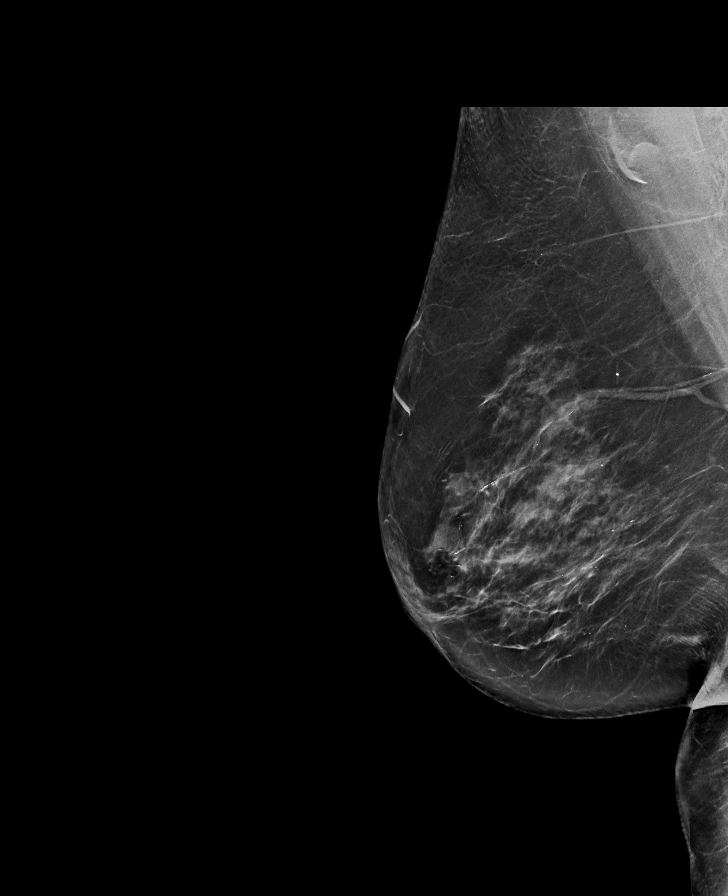

[L CC synth-2D]
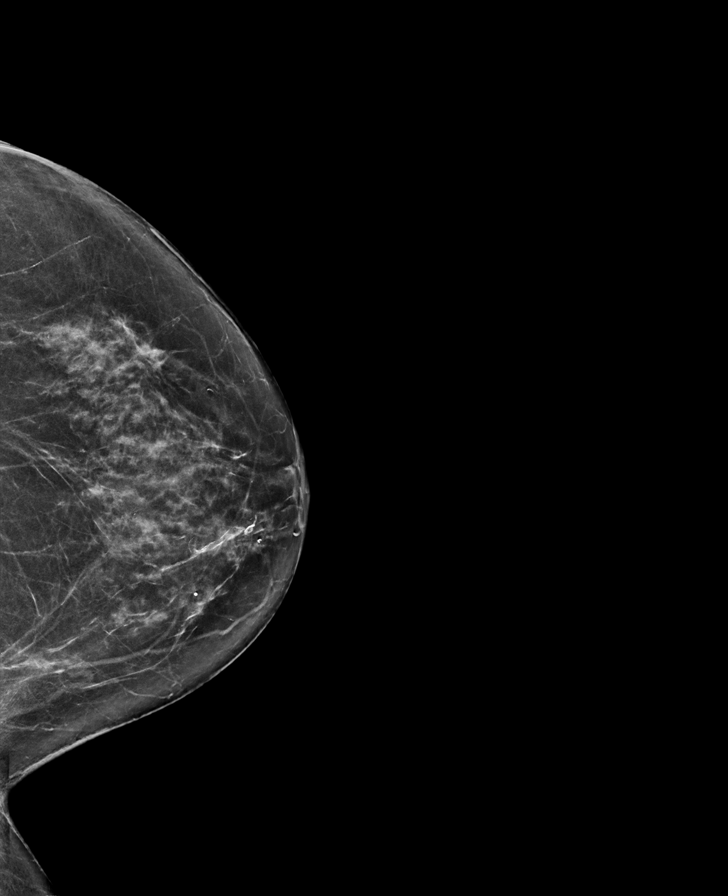

[R CC synth-2D]
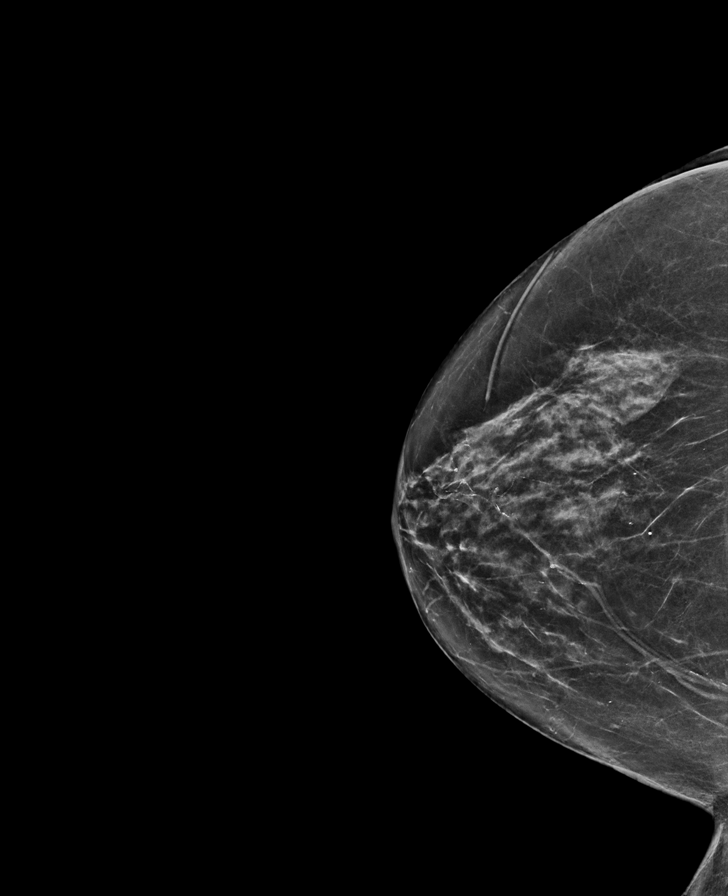

[L CC tomo · tomo slice 34/67.0]
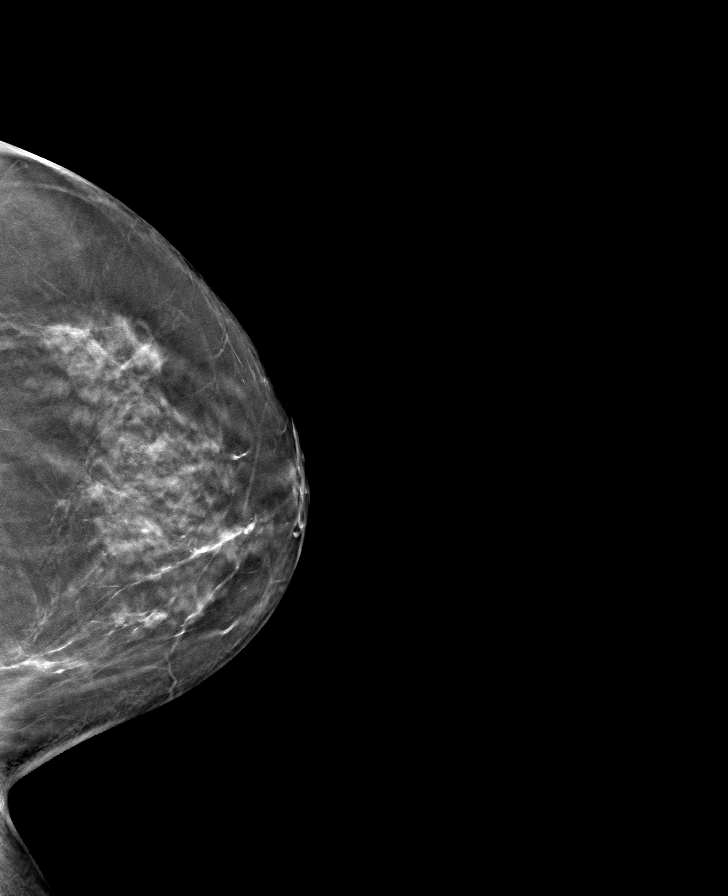

[L MLO tomo · tomo slice 37/74.0]
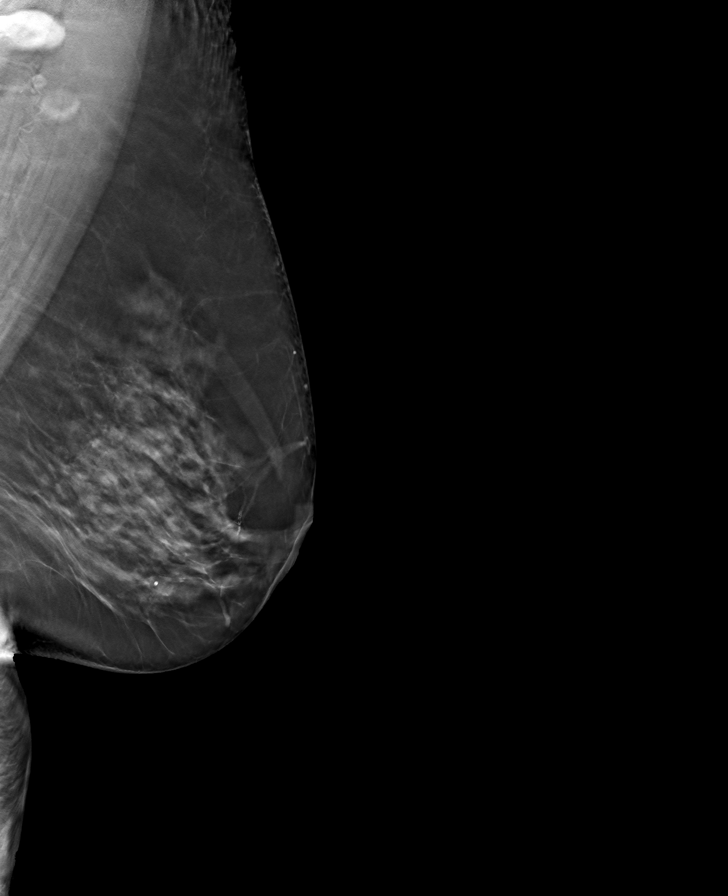

[R MLO tomo · tomo slice 39/76.0]
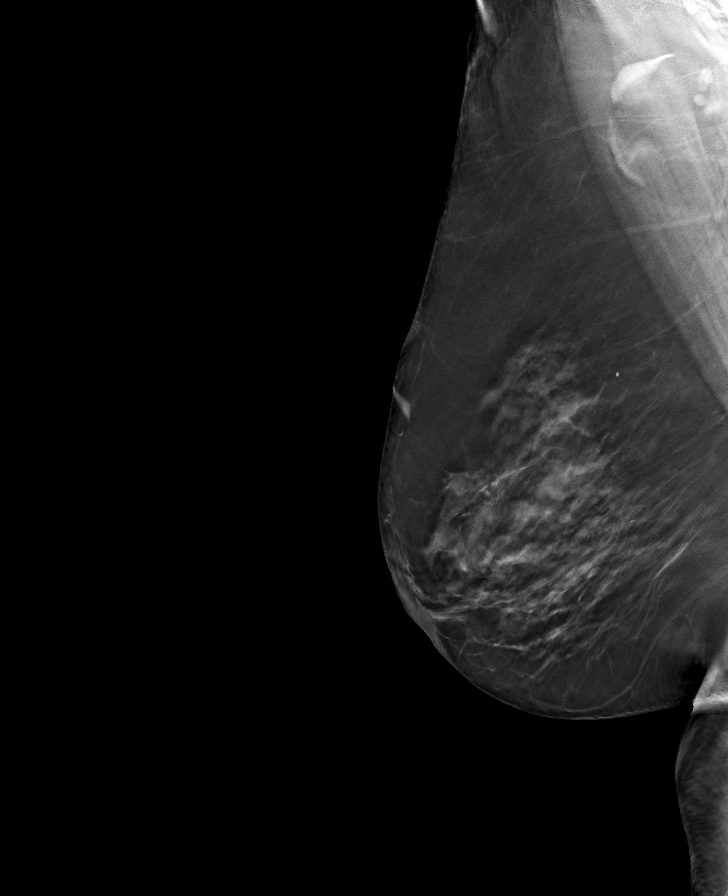

[R CC tomo · tomo slice 31/62.0]
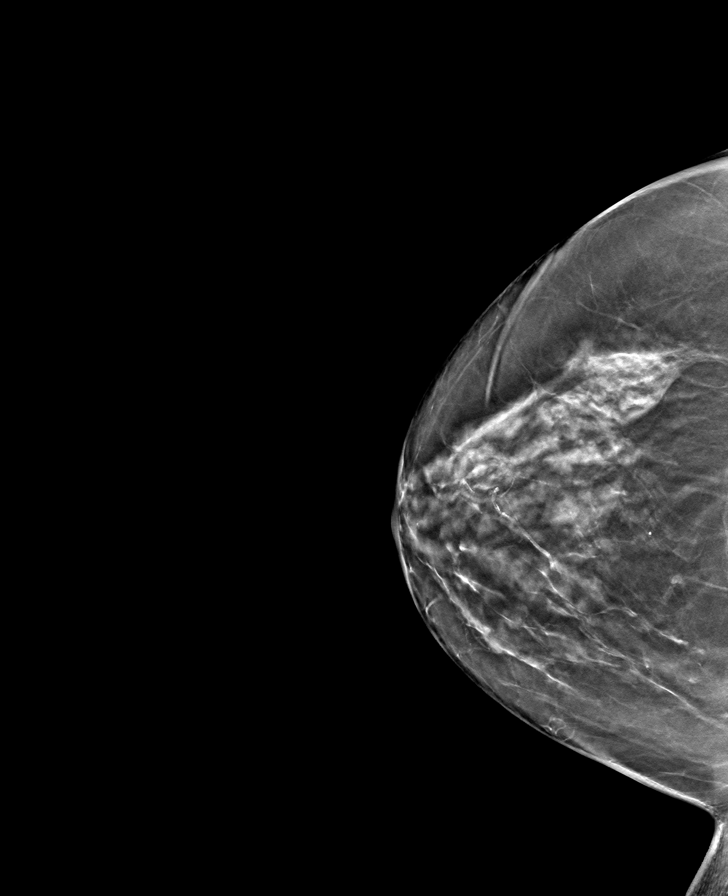

[8 of 24 positions shown; findings below may reference images not displayed]

ACR Breast Density Category c: The breast tissue is heterogeneously
dense, which may obscure small masses.
FINDINGS: There are no findings suspicious for malignancy. Images were
processed with CAD.
IMPRESSION: No mammographic evidence of malignancy. A result letter of this
screening mammogram will be mailed directly to the patient.

RECOMMENDATION:
Screening mammogram in one year. (Code:[id number])

BI-RADS CATEGORY  1: Negative.

## 2021-09-13 IMAGING — CR DG CHEST 2V
2 series · 2 of 2 positions shown · non-contrast
Comparison: 05/12/2019

CLINICAL DATA: Pleural effusion, COPD

EXAM:
CHEST - 2 VIEW

[w chest pa]
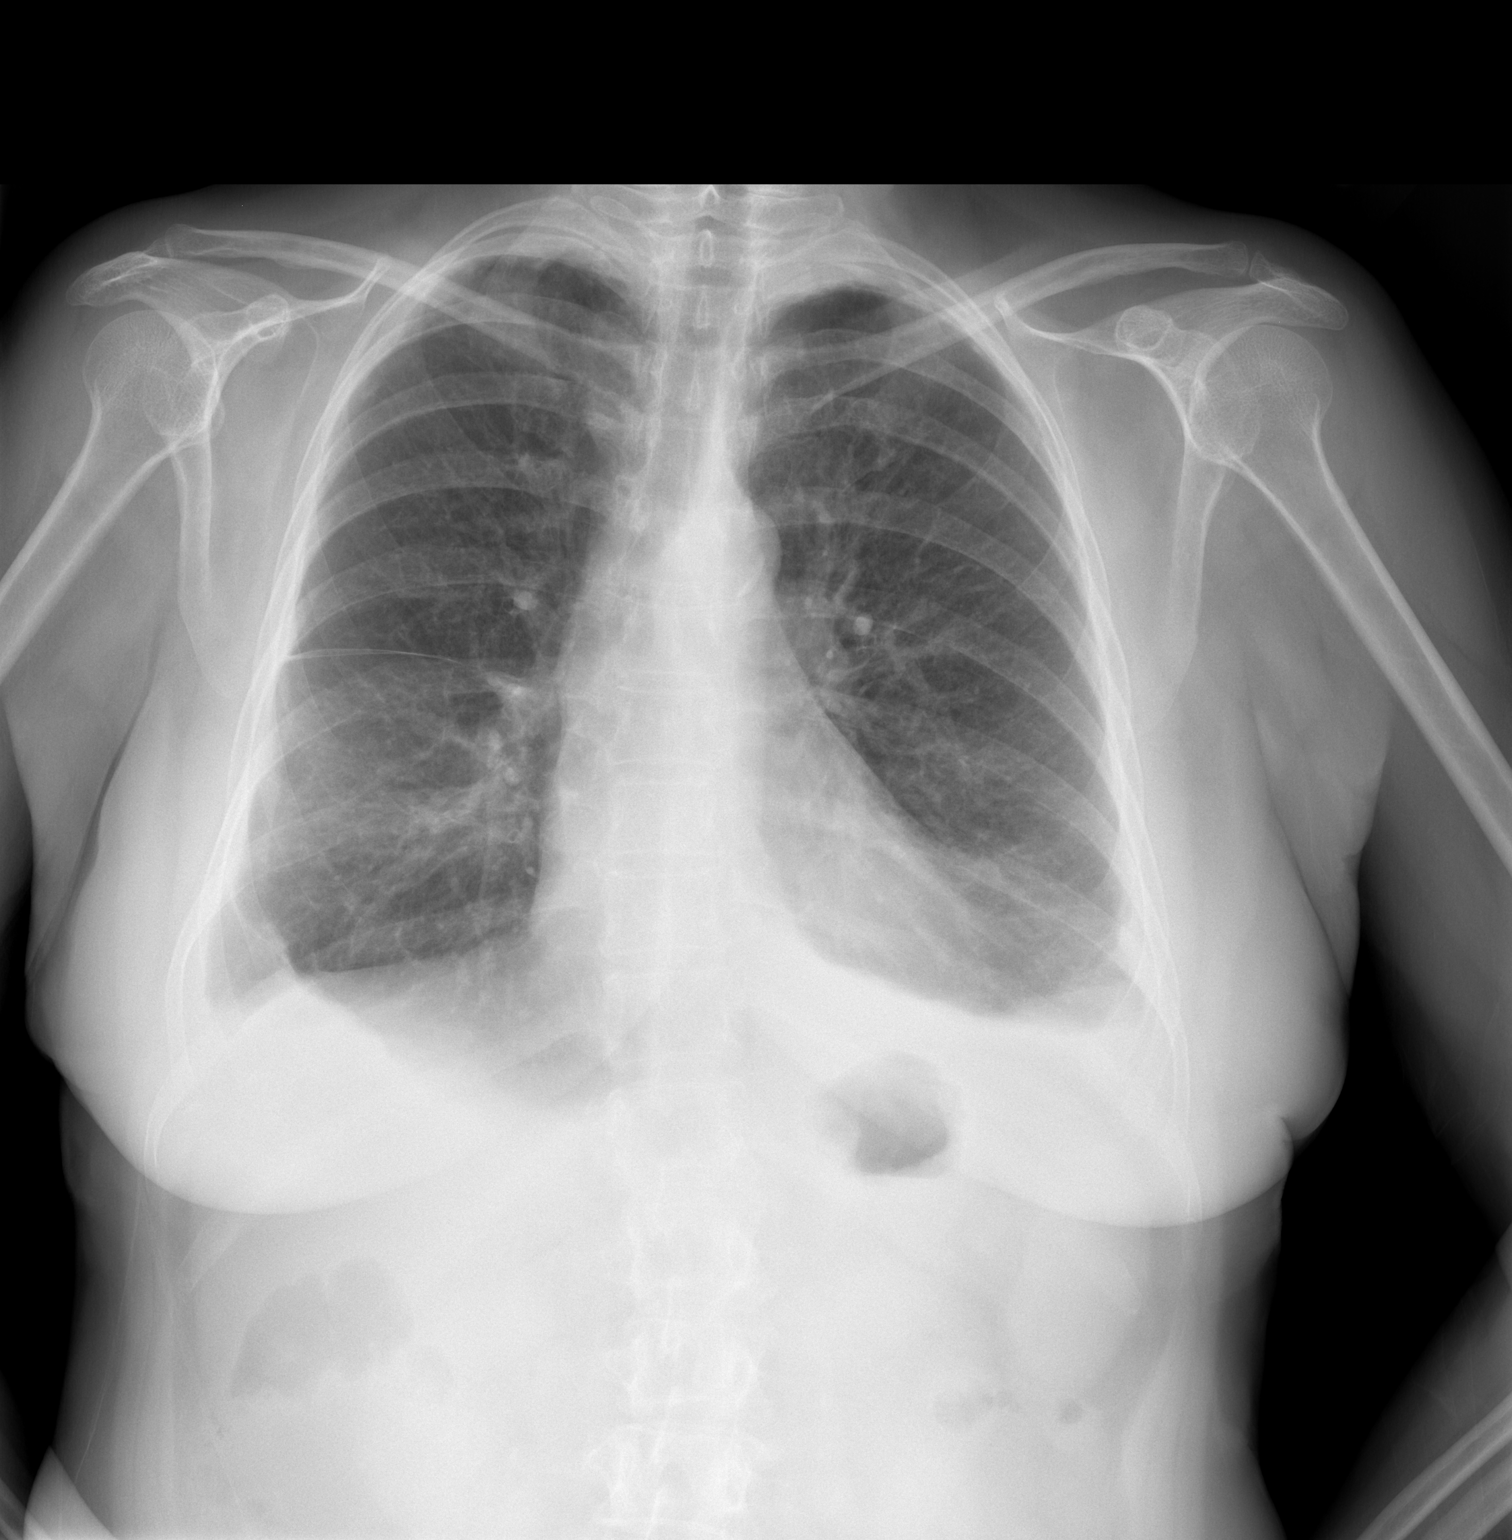

[w chest lat]
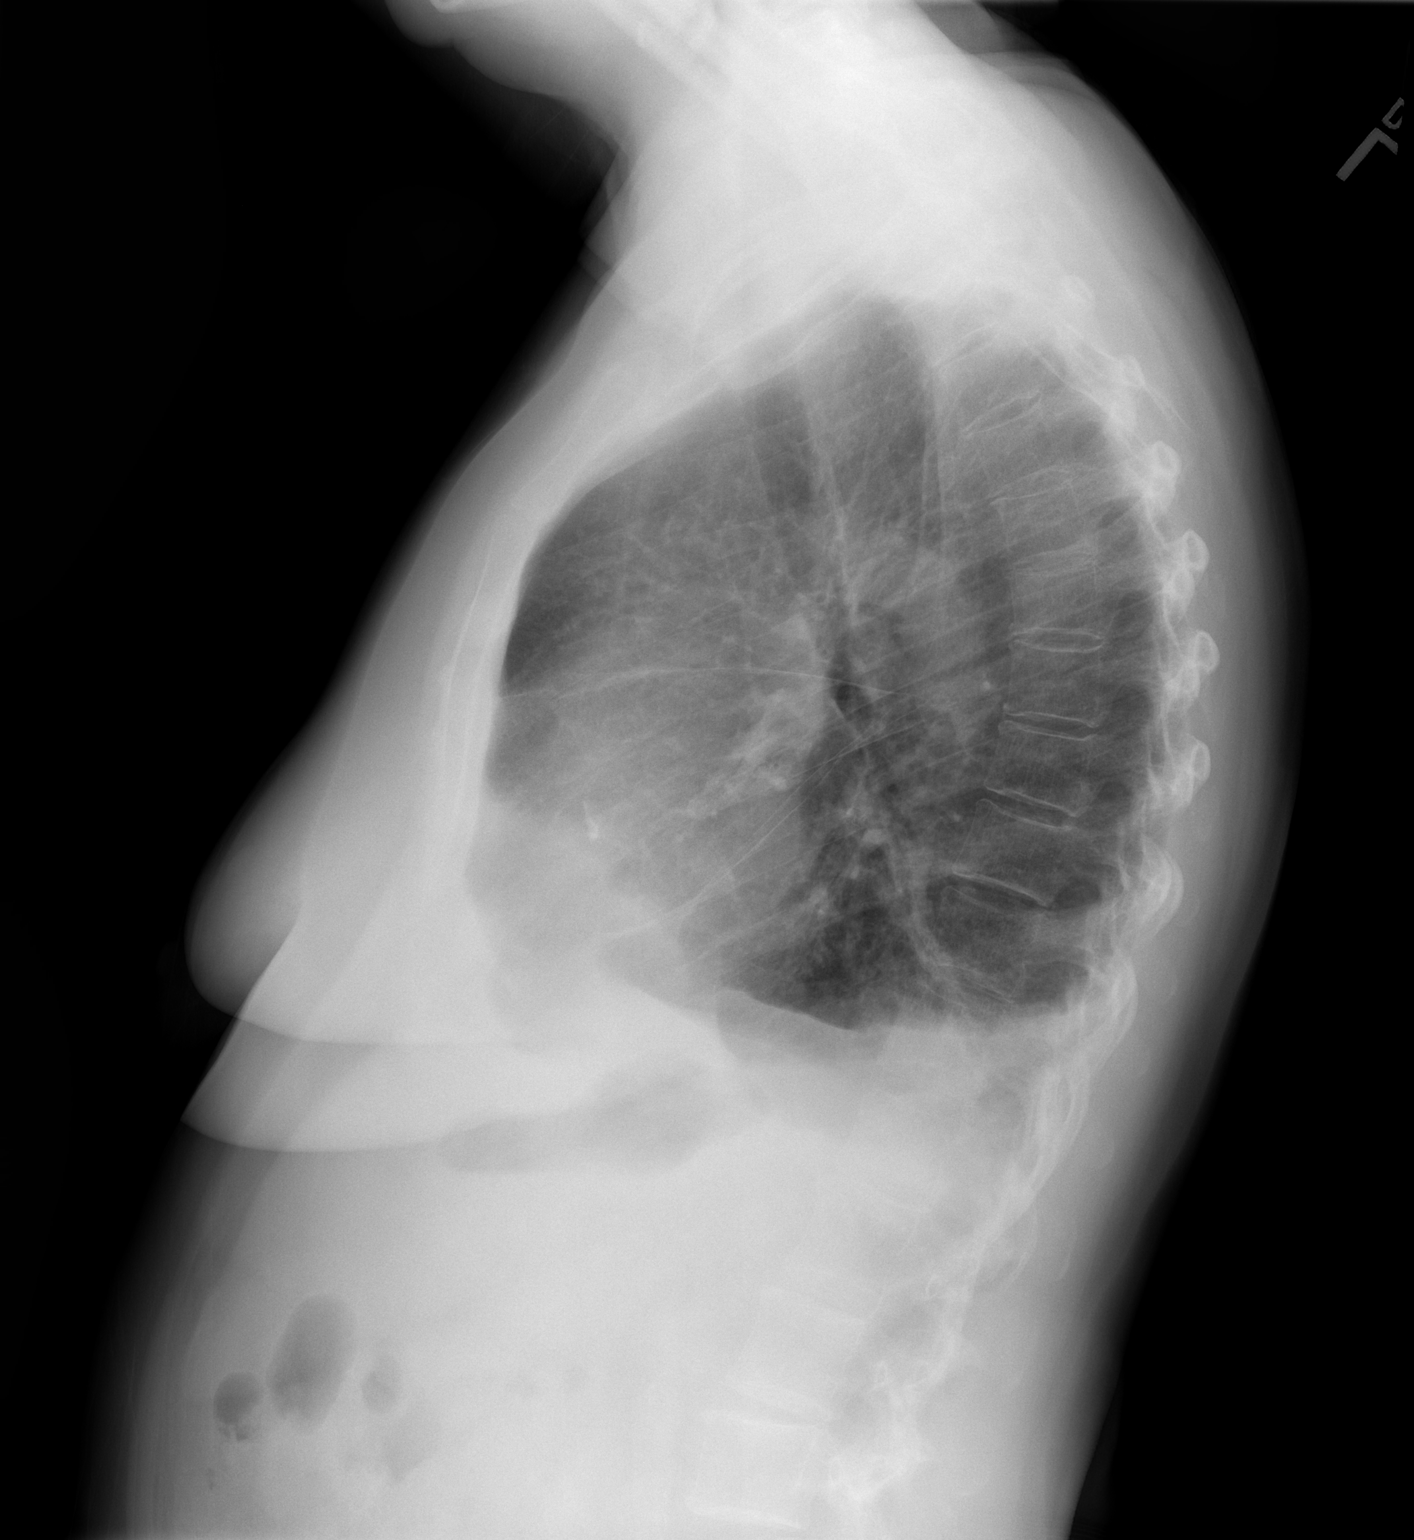

[2 of 2 positions shown; findings below may reference images not displayed]

FINDINGS: The heart size and mediastinal contours are within normal limits.
Unchanged small bilateral pleural effusions. The visualized skeletal
structures are unremarkable.
IMPRESSION: Unchanged small bilateral pleural effusions. No acute appearing
airspace opacity.

## 2021-09-16 DIAGNOSIS — H35342 Macular cyst, hole, or pseudohole, left eye: Secondary | ICD-10-CM | POA: Diagnosis not present

## 2021-09-16 DIAGNOSIS — H35341 Macular cyst, hole, or pseudohole, right eye: Secondary | ICD-10-CM | POA: Diagnosis not present

## 2021-09-24 DIAGNOSIS — H353131 Nonexudative age-related macular degeneration, bilateral, early dry stage: Secondary | ICD-10-CM | POA: Diagnosis not present

## 2021-09-24 DIAGNOSIS — H35342 Macular cyst, hole, or pseudohole, left eye: Secondary | ICD-10-CM | POA: Diagnosis not present

## 2021-10-13 ENCOUNTER — Telehealth: Payer: Self-pay | Admitting: Adult Health

## 2021-10-13 MED ORDER — SPIRIVA RESPIMAT 2.5 MCG/ACT IN AERS: 2.0000 | INHALATION_SPRAY | Freq: Every day | RESPIRATORY_TRACT | 0 refills | Status: DC

## 2021-10-13 NOTE — Telephone Encounter (Signed)
Called and spoke with patient. Advised patient I would leave a sample of spiriva up front for her.   Nothing further needed.

## 2021-10-15 DIAGNOSIS — R300 Dysuria: Secondary | ICD-10-CM | POA: Diagnosis not present

## 2021-10-17 DIAGNOSIS — H353131 Nonexudative age-related macular degeneration, bilateral, early dry stage: Secondary | ICD-10-CM | POA: Diagnosis not present

## 2021-10-17 DIAGNOSIS — H35342 Macular cyst, hole, or pseudohole, left eye: Secondary | ICD-10-CM | POA: Diagnosis not present

## 2021-11-05 DIAGNOSIS — R7301 Impaired fasting glucose: Secondary | ICD-10-CM | POA: Diagnosis not present

## 2021-11-05 DIAGNOSIS — E782 Mixed hyperlipidemia: Secondary | ICD-10-CM | POA: Diagnosis not present

## 2021-11-05 DIAGNOSIS — E890 Postprocedural hypothyroidism: Secondary | ICD-10-CM | POA: Diagnosis not present

## 2021-11-05 DIAGNOSIS — F3342 Major depressive disorder, recurrent, in full remission: Secondary | ICD-10-CM | POA: Diagnosis not present

## 2021-11-05 DIAGNOSIS — I129 Hypertensive chronic kidney disease with stage 1 through stage 4 chronic kidney disease, or unspecified chronic kidney disease: Secondary | ICD-10-CM | POA: Diagnosis not present

## 2021-11-05 DIAGNOSIS — Z23 Encounter for immunization: Secondary | ICD-10-CM | POA: Diagnosis not present

## 2021-11-05 DIAGNOSIS — G4733 Obstructive sleep apnea (adult) (pediatric): Secondary | ICD-10-CM | POA: Diagnosis not present

## 2021-11-05 DIAGNOSIS — Z0000 Encounter for general adult medical examination without abnormal findings: Secondary | ICD-10-CM | POA: Diagnosis not present

## 2021-11-05 DIAGNOSIS — R7309 Other abnormal glucose: Secondary | ICD-10-CM | POA: Diagnosis not present

## 2021-11-05 DIAGNOSIS — N952 Postmenopausal atrophic vaginitis: Secondary | ICD-10-CM | POA: Diagnosis not present

## 2021-11-05 DIAGNOSIS — R300 Dysuria: Secondary | ICD-10-CM | POA: Diagnosis not present

## 2021-11-05 DIAGNOSIS — R7303 Prediabetes: Secondary | ICD-10-CM | POA: Diagnosis not present

## 2021-11-05 DIAGNOSIS — M810 Age-related osteoporosis without current pathological fracture: Secondary | ICD-10-CM | POA: Diagnosis not present

## 2021-11-05 DIAGNOSIS — J449 Chronic obstructive pulmonary disease, unspecified: Secondary | ICD-10-CM | POA: Diagnosis not present

## 2021-11-05 DIAGNOSIS — N183 Chronic kidney disease, stage 3 unspecified: Secondary | ICD-10-CM | POA: Diagnosis not present

## 2021-11-07 ENCOUNTER — Other Ambulatory Visit: Payer: Self-pay | Admitting: Family Medicine

## 2021-11-07 DIAGNOSIS — M810 Age-related osteoporosis without current pathological fracture: Secondary | ICD-10-CM

## 2021-11-13 DIAGNOSIS — Z96652 Presence of left artificial knee joint: Secondary | ICD-10-CM | POA: Diagnosis not present

## 2021-11-13 DIAGNOSIS — M25561 Pain in right knee: Secondary | ICD-10-CM | POA: Diagnosis not present

## 2021-11-13 DIAGNOSIS — M25562 Pain in left knee: Secondary | ICD-10-CM | POA: Diagnosis not present

## 2021-11-13 DIAGNOSIS — S39012A Strain of muscle, fascia and tendon of lower back, initial encounter: Secondary | ICD-10-CM | POA: Diagnosis not present

## 2021-12-02 ENCOUNTER — Telehealth: Payer: Self-pay | Admitting: Adult Health

## 2021-12-03 NOTE — Telephone Encounter (Signed)
Called and spoke to patient and let her know she can come by the office to pick up her samples of Spirivia. Pt states her husband will be by today to pick them up. Nothing further needed

## 2021-12-15 DIAGNOSIS — R059 Cough, unspecified: Secondary | ICD-10-CM | POA: Diagnosis not present

## 2021-12-15 DIAGNOSIS — R5383 Other fatigue: Secondary | ICD-10-CM | POA: Diagnosis not present

## 2021-12-15 DIAGNOSIS — R52 Pain, unspecified: Secondary | ICD-10-CM | POA: Diagnosis not present

## 2021-12-15 DIAGNOSIS — R509 Fever, unspecified: Secondary | ICD-10-CM | POA: Diagnosis not present

## 2021-12-15 DIAGNOSIS — Z03818 Encounter for observation for suspected exposure to other biological agents ruled out: Secondary | ICD-10-CM | POA: Diagnosis not present

## 2021-12-15 DIAGNOSIS — J209 Acute bronchitis, unspecified: Secondary | ICD-10-CM | POA: Diagnosis not present

## 2021-12-15 DIAGNOSIS — Z8709 Personal history of other diseases of the respiratory system: Secondary | ICD-10-CM | POA: Diagnosis not present

## 2022-01-15 DIAGNOSIS — H43821 Vitreomacular adhesion, right eye: Secondary | ICD-10-CM | POA: Diagnosis not present

## 2022-01-15 DIAGNOSIS — H353131 Nonexudative age-related macular degeneration, bilateral, early dry stage: Secondary | ICD-10-CM | POA: Diagnosis not present

## 2022-02-12 DIAGNOSIS — H26491 Other secondary cataract, right eye: Secondary | ICD-10-CM | POA: Diagnosis not present

## 2022-02-12 DIAGNOSIS — H35342 Macular cyst, hole, or pseudohole, left eye: Secondary | ICD-10-CM | POA: Diagnosis not present

## 2022-02-25 ENCOUNTER — Telehealth: Payer: Self-pay | Admitting: Adult Health

## 2022-02-25 NOTE — Telephone Encounter (Signed)
Called and spoke with patient. Patient stated that she needed spiriva samples. Advised patient we did not have the spiriva 2.5 that she uses. Patient wanted to know if she could use the 1.25 in the spiriva since we do not have the 2.5. advised patient I would ask and let her know.   TP, please advise.

## 2022-02-25 NOTE — Telephone Encounter (Signed)
Patient called to request some samples of Spiriva.  Please call patient to let her know if this is possible and when she can pick them up.  CB# [phone]

## 2022-02-26 MED ORDER — SPIRIVA RESPIMAT 1.25 MCG/ACT IN AERS: 2.0000 | INHALATION_SPRAY | Freq: Every day | RESPIRATORY_TRACT | 0 refills | Status: DC

## 2022-02-26 NOTE — Telephone Encounter (Signed)
Called and spoke with patient. She is aware that I have left 2 samples at the front desk for her. She stated that she could come by the office tomorrow to pick these up.   Nothing further needed at time of call.

## 2022-02-26 NOTE — Telephone Encounter (Signed)
Yes that is fine

## 2022-02-26 NOTE — Telephone Encounter (Signed)
Patient checking on message for samples. Patient phone number is [phone].

## 2022-02-26 NOTE — Telephone Encounter (Signed)
Called and spoke with patient. She is aware that we are waiting on a response from TP. She verbalized understanding.   TP, please advise if you are ok with [place] giving her samples of Spiriva 1.72mg instead of the 2.557m. Thanks!

## 2022-03-24 DIAGNOSIS — M79644 Pain in right finger(s): Secondary | ICD-10-CM | POA: Diagnosis not present

## 2022-04-17 DIAGNOSIS — L600 Ingrowing nail: Secondary | ICD-10-CM | POA: Diagnosis not present

## 2022-04-21 ENCOUNTER — Telehealth: Payer: Self-pay | Admitting: Acute Care

## 2022-04-21 DIAGNOSIS — J029 Acute pharyngitis, unspecified: Secondary | ICD-10-CM | POA: Diagnosis not present

## 2022-04-21 NOTE — Telephone Encounter (Signed)
Patient is requesting samples of Spiriva. Please advise

## 2022-04-22 NOTE — Telephone Encounter (Signed)
Pt called the office again checking on samples of Spiriva. Stated to pt that we would not be able to provide her samples of Spiriva until she was seen for an appt as it has been nearly 2 years since she has been seen. Pt verbalized understanding. Nothing further needed.

## 2022-04-27 DIAGNOSIS — M79644 Pain in right finger(s): Secondary | ICD-10-CM | POA: Diagnosis not present

## 2022-04-28 ENCOUNTER — Ambulatory Visit
Admission: RE | Admit: 2022-04-28 | Discharge: 2022-04-28 | Disposition: A | Discharge status: Home / Self Care | Payer: PPO | Source: Ambulatory Visit | Attending: Family Medicine | Admitting: Family Medicine

## 2022-04-28 DIAGNOSIS — M85852 Other specified disorders of bone density and structure, left thigh: Secondary | ICD-10-CM | POA: Diagnosis not present

## 2022-04-28 DIAGNOSIS — M810 Age-related osteoporosis without current pathological fracture: Secondary | ICD-10-CM

## 2022-05-04 ENCOUNTER — Telehealth: Payer: Self-pay | Admitting: Adult Health

## 2022-05-04 DIAGNOSIS — M79644 Pain in right finger(s): Secondary | ICD-10-CM | POA: Diagnosis not present

## 2022-05-04 MED ORDER — SPIRIVA RESPIMAT 2.5 MCG/ACT IN AERS: 2.0000 | INHALATION_SPRAY | Freq: Every day | RESPIRATORY_TRACT | 0 refills | Status: DC

## 2022-05-04 NOTE — Telephone Encounter (Signed)
Refill of Spiriva has been sent to pharmacy. Advised pt to keep apt this week for future refills. NFN

## 2022-05-04 NOTE — Telephone Encounter (Signed)
Patient would like sample of Spiriva. Patient has appointment 05/07/2022 with Rubye Oaks NP. Patient phone number is [phone].

## 2022-05-07 ENCOUNTER — Ambulatory Visit: Payer: PPO | Admitting: Adult Health

## 2022-05-07 ENCOUNTER — Encounter: Payer: Self-pay | Admitting: Adult Health

## 2022-05-07 VITALS — BP 108/50 | HR 95 | Temp 98.2°F | Ht 58.75 in | Wt 121.0 lb

## 2022-05-07 DIAGNOSIS — J449 Chronic obstructive pulmonary disease, unspecified: Secondary | ICD-10-CM

## 2022-05-07 MED ORDER — ALBUTEROL SULFATE HFA 108 (90 BASE) MCG/ACT IN AERS: 2.0000 | INHALATION_SPRAY | Freq: Four times a day (QID) | RESPIRATORY_TRACT | 3 refills | Status: AC | PRN

## 2022-05-07 MED ORDER — SPIRIVA RESPIMAT 2.5 MCG/ACT IN AERS: 2.0000 | INHALATION_SPRAY | Freq: Every day | RESPIRATORY_TRACT | 3 refills | Status: DC

## 2022-05-07 NOTE — Assessment & Plan Note (Signed)
Controlled on current regimen.  Continue on Spiriva.  Continue yearly CT chest screening program. PFTs on return visit.  Plan  Patient Instructions  Continue on Spiriva 2 puffs daily , rinse after use.  Ventolin inhaler As needed  Wheezing /shortness of breath.  Activity as tolerated.  Continue with Lung cancer screening program- Yearly low dose CT   Follow up with Dr. Marchelle Gearing in 6 months with PFT and As needed

## 2022-05-07 NOTE — Progress Notes (Signed)
  ID: Kimberly [name], female    DOB: 19-Nov-1950, 72 y.o.   MRN: [id number]  Chief Complaint  Patient presents with   Follow-up    Referring provider: Lupita Raider, MD  HPI: 72 year old female former smoker followed for COPD Medical history significant for depression, hyperlipidemia, hypertension   TEST/EVENTS :  High-resolution CT chest August 11, 2019 - for interstitial lung disease, mild diffuse bilateral bronchial wall thickening with innumerable tiny centrilobular nodules most numerous and the lung apices consistent with a smoking-related bronchitis/respiratory bronchiolitis, 3 mm right upper lobe nodule.  Atherosclerosis noted, previous pleural effusions resolved    Autoimmune/connective tissue labs negative except for sed rate elevated at 66 and ANA positive , 1: 40, cytoplasmic, CCP negative    2D echo showed a preserved EF and normal pulmonary artery pressures with grade 1 diastolic dysfunction.   Pulmonary function testing September 13, 2019 moderate obstruction with FEV1 at 64%, ratio 67, FVC 71%, significant bronchodilator response.    05/07/2022 Follow up ; COPD  Patient presents for a follow-up visit last seen January 2022.  Patient is followed for underlying moderate COPD.  She says overall breathing is doing okay.  She remains on Spiriva daily. Participates in the lung cancer CT screening program.  Last CT chest was in August 2023 that showed mild emphysema with no suspicious pulmonary nodules.  Says she has stopped smoking 08/2020.  Gets short of breath with some activities especially prolonged walking and showering.  Active at home, does house chores and cooking. Drives and shopping .  Patient is on ACE inhibitor.  Denies significant cough. No wheezing .   Allergies  Allergen Reactions   Crestor [Rosuvastatin] Other (See Comments)    Unknown   Fosamax [Alendronate]     Muscles spasms and cramps   Oxycodone Itching   Penicillins Itching, Rash and Other  (See Comments)    Did it involve swelling of the face/tongue/throat, SOB, or low BP? No Did it involve sudden or severe rash/hives, skin peeling, or any reaction on the inside of your mouth or nose? Yes Did you need to seek medical attention at a hospital or doctor's office? Yes When did it last happen? Long time ago If all above answers are "NO", may proceed with cephalosporin use.    Statins     Muscle spasms and cramps   Sulfonamide Derivatives Rash    Immunization History  Administered Date(s) Administered   Influenza Split 11/13/2011, 10/12/2012, 09/25/2019   Influenza, High Dose Seasonal PF 09/13/2018   Influenza,inj,quad, With Preservative 09/26/2016   Pneumococcal Conjugate-13 03/22/2015   Pneumococcal Polysaccharide-23 01/04/2012, 05/21/2016   Zoster, Live 09/25/2019    Past Medical History:  Diagnosis Date   Anxiety    Arthritis    osteoarthritis   Bronchitis    Cataract    Complication of anesthesia    s/p bladder retention-"only time it happened"   COPD (chronic obstructive pulmonary disease)    Depression    Generalized headaches    Heart murmur    heard occ   Hiatal hernia    small- no problems   History of blood in urine    microscopic -no problems identified," occ." high protein in urine"   History of goiter    Hyperlipemia    Hypertension    Osteoporosis    Sigmoid diverticulosis    pt unaware   Subcutaneous mass    RIGHT [facility] AND LEFT LATERAL CHEST WALL   Wears glasses    Wears  glasses    Wears partial dentures    Upper and lower    Tobacco History: Social History   Tobacco Use  Smoking Status Former   Packs/day: 1.00   Years: 45.00   Additional pack years: 0.00   Total pack years: 45.00   Types: Cigarettes   Quit date: 01/12/2019   Years since quitting: 3.3  Smokeless Tobacco Never  Tobacco Comments   stopped 01/12/20   Counseling given: Not Answered Tobacco comments: stopped 01/12/20   Outpatient Medications Prior to Visit   Medication Sig Dispense Refill   acetaminophen (TYLENOL) 500 MG tablet Take 1,000 mg by mouth every 6 (six) hours as needed for moderate pain or headache.      albuterol (VENTOLIN HFA) 108 (90 Base) MCG/ACT inhaler Inhale 2 puffs into the lungs every 4 (four) hours as needed for wheezing or shortness of breath.     calcium carbonate (OSCAL) 1500 (600 Ca) MG TABS tablet Take 600 mg of elemental calcium by mouth daily with breakfast.     Cholecalciferol (VITAMIN D) 1000 UNITS capsule Take 1,000 Units by mouth every morning.     Coenzyme Q10 (COQ10) 100 MG CAPS Take 100 mg by mouth daily.     denosumab (PROLIA) 60 MG/ML SOLN injection Inject 60 mg into the skin every 6 (six) months. Administer in upper arm, thigh, or abdomen     fenofibrate 160 MG tablet Take 160 mg by mouth daily.     lisinopril (ZESTRIL) 20 MG tablet Take 20 mg by mouth daily.     methocarbamol (ROBAXIN) 500 MG tablet Take by mouth at bedtime.     omeprazole (PRILOSEC) 40 MG capsule Take 40 mg by mouth every morning.     Tiotropium Bromide Monohydrate (SPIRIVA RESPIMAT) 2.5 MCG/ACT AERS Inhale 2 puffs into the lungs daily. 3 each 3   valACYclovir (VALTREX) 1000 MG tablet Take 1,000 mg by mouth 2 (two) times daily as needed.     venlafaxine XR (EFFEXOR-XR) 75 MG 24 hr capsule Take 75 mg by mouth daily.     carvedilol (COREG) 6.25 MG tablet Take 1 tablet (6.25 mg total) by mouth 2 (two) times daily with a meal. 60 tablet 2   SPIRIVA RESPIMAT 2.5 MCG/ACT AERS INHALE 2 PUFFS BY MOUTH INTO THE LUNGS DAILY 4 g 2   Calcium Carb-Cholecalciferol (CALCIUM 600+D3 PO) Take 1 tablet by mouth daily. (Patient not taking: Reported on 05/07/2022)     levofloxacin (LEVAQUIN) 750 MG tablet levofloxacin 750 mg tablet  TAKE 1 TABLET (750 MG TOTAL) BY MOUTH EVERY OTHER DAY FOR 6 DAYS. (Patient not taking: Reported on 05/07/2022)     LIVALO 2 MG TABS Take 1 tablet by mouth daily.  (Patient not taking: Reported on 05/07/2022)     Magnesium 250 MG TABS  Take 250 mg by mouth at bedtime. (Patient not taking: Reported on 05/07/2022)     naproxen sodium (ALEVE) 220 MG tablet Take 220 mg by mouth as needed (For pain). (Patient not taking: Reported on 05/07/2022)     nicotine (NICODERM CQ - DOSED IN MG/24 HOURS) 14 mg/24hr patch Place 14 mg onto the skin daily. (Patient not taking: Reported on 05/07/2022)     OLANZapine (ZYPREXA) 2.5 MG tablet Take 2.5-5 mg by mouth daily. (Patient not taking: Reported on 05/07/2022)     predniSONE (STERAPRED UNI-PAK 21 TAB) 10 MG (21) TBPK tablet Take by mouth as directed. (Patient not taking: Reported on 05/07/2022)     promethazine-codeine (PHENERGAN WITH  CODEINE) 6.25-10 MG/5ML syrup promethazine 6.25 mg-codeine 10 mg/5 mL syrup  TAKE BY MOUTH EVERY 6 HOURS AS NEEDED FOR COUGH (Patient not taking: Reported on 05/07/2022)     Tiotropium Bromide Monohydrate (SPIRIVA RESPIMAT) 1.25 MCG/ACT AERS Inhale 2 puffs into the lungs daily. (Patient not taking: Reported on 05/07/2022) 8 g 0   Tiotropium Bromide Monohydrate (SPIRIVA RESPIMAT) 2.5 MCG/ACT AERS Inhale 2 puffs into the lungs daily. (Patient not taking: Reported on 05/07/2022) 4 g 0   Tiotropium Bromide Monohydrate (SPIRIVA RESPIMAT) 2.5 MCG/ACT AERS Inhale 2 puffs into the lungs daily. (Patient not taking: Reported on 05/07/2022) 4 g 0   Tiotropium Bromide Monohydrate (SPIRIVA RESPIMAT) 2.5 MCG/ACT AERS Inhale 2 puffs into the lungs daily. Patient must keep apt for future refills (Patient not taking: Reported on 05/07/2022) 4 g 0   No facility-administered medications prior to visit.     Review of Systems:   Constitutional:   No  weight loss, night sweats,  Fevers, chills,  +fatigue, or  lassitude.  HEENT:   No headaches,  Difficulty swallowing,  Tooth/dental problems, or  Sore throat,                No sneezing, itching, ear ache, nasal congestion, post nasal drip,   CV:  No chest pain,  Orthopnea, PND, swelling in lower extremities, anasarca, dizziness,  palpitations, syncope.   GI  No heartburn, indigestion, abdominal pain, nausea, vomiting, diarrhea, change in bowel habits, loss of appetite, bloody stools.   Resp:   No excess mucus, no productive cough,  No non-productive cough,  No coughing up of blood.  No change in color of mucus.  No wheezing.  No chest wall deformity  Skin: no rash or lesions.  GU: no dysuria, change in color of urine, no urgency or frequency.  No flank pain, no hematuria   MS:  No joint pain or swelling.  No decreased range of motion.  No back pain.    Physical Exam  BP (!) 108/50 (BP Location: Right Arm, Patient Position: Sitting, Cuff Size: Normal)   Pulse 95   Temp 98.2 F (36.8 C) (Oral)   Ht 4' 10.75" (1.492 m)   Wt 121 lb (54.9 kg)   SpO2 94%   BMI 24.65 kg/m   GEN: A/Ox3; pleasant , NAD, well nourished    HEENT:  [place]/AT,   NOSE-clear, THROAT-clear, no lesions, no postnasal drip or exudate noted.   NECK:  Supple w/ fair ROM; no JVD; normal carotid impulses w/o bruits; no thyromegaly or nodules palpated; no lymphadenopathy.    RESP  Clear  P & A; w/o, wheezes/ rales/ or rhonchi. no accessory muscle use, no dullness to percussion  CARD:  RRR, no m/r/g, no peripheral edema, pulses intact, no cyanosis or clubbing.  GI:   Soft & nt; nml bowel sounds; no organomegaly or masses detected.   Musco: Warm bil, no deformities or joint swelling noted.   Neuro: alert, no focal deficits noted.    Skin: Warm, no lesions or rashes    Lab Results:  CBC   BNP  No results found for: "PROBNP"  Imaging:       Latest Ref Rng & Units 09/13/2019    9:42 AM  PFT Results  FVC-Pre L 1.26   FVC-Predicted Pre % 51   FVC-Post L 1.75   FVC-Predicted Post % 71   Pre FEV1/FVC % % 72   Post FEV1/FCV % % 67   FEV1-Pre L 0.91  FEV1-Predicted Pre % 49   FEV1-Post L 1.18   DLCO uncorrected ml/min/mmHg 15.57   DLCO UNC% % 95   DLCO corrected ml/min/mmHg 15.04   DLCO COR %Predicted % 91   DLVA Predicted  % 99   TLC L 4.23   TLC % Predicted % 98   RV % Predicted % 142     No results found for: "NITRICOXIDE"      Assessment & Plan:   COPD (chronic obstructive pulmonary disease) (HCC) Controlled on current regimen.  Continue on Spiriva.  Continue yearly CT chest screening program. PFTs on return visit.  Plan  Patient Instructions  Continue on Spiriva 2 puffs daily , rinse after use.  Ventolin inhaler As needed  Wheezing /shortness of breath.  Activity as tolerated.  Continue with Lung cancer screening program- Yearly low dose CT   Follow up with Dr. Marchelle Gearing in 6 months with PFT and As needed          Rubye Oaks, NP 05/07/2022

## 2022-05-07 NOTE — Addendum Note (Signed)
Addended by: Delrae Rend on: 05/07/2022 04:45 PM   Modules accepted: Orders

## 2022-05-07 NOTE — Patient Instructions (Signed)
Continue on Spiriva 2 puffs daily , rinse after use.  Ventolin inhaler As needed  Wheezing /shortness of breath.  Activity as tolerated.  Continue with Lung cancer screening program- Yearly low dose CT   Follow up with Dr. Marchelle Gearing in 6 months with PFT and As needed

## 2022-05-11 DIAGNOSIS — J439 Emphysema, unspecified: Secondary | ICD-10-CM | POA: Diagnosis not present

## 2022-05-11 DIAGNOSIS — R7303 Prediabetes: Secondary | ICD-10-CM | POA: Diagnosis not present

## 2022-05-11 DIAGNOSIS — I129 Hypertensive chronic kidney disease with stage 1 through stage 4 chronic kidney disease, or unspecified chronic kidney disease: Secondary | ICD-10-CM | POA: Diagnosis not present

## 2022-05-11 DIAGNOSIS — R61 Generalized hyperhidrosis: Secondary | ICD-10-CM | POA: Diagnosis not present

## 2022-05-11 DIAGNOSIS — N183 Chronic kidney disease, stage 3 unspecified: Secondary | ICD-10-CM | POA: Diagnosis not present

## 2022-05-11 DIAGNOSIS — R221 Localized swelling, mass and lump, neck: Secondary | ICD-10-CM | POA: Diagnosis not present

## 2022-05-11 DIAGNOSIS — J449 Chronic obstructive pulmonary disease, unspecified: Secondary | ICD-10-CM | POA: Diagnosis not present

## 2022-05-11 DIAGNOSIS — I700 Atherosclerosis of aorta: Secondary | ICD-10-CM | POA: Diagnosis not present

## 2022-05-11 DIAGNOSIS — E782 Mixed hyperlipidemia: Secondary | ICD-10-CM | POA: Diagnosis not present

## 2022-05-11 DIAGNOSIS — M810 Age-related osteoporosis without current pathological fracture: Secondary | ICD-10-CM | POA: Diagnosis not present

## 2022-05-11 DIAGNOSIS — R197 Diarrhea, unspecified: Secondary | ICD-10-CM | POA: Diagnosis not present

## 2022-05-12 ENCOUNTER — Other Ambulatory Visit (HOSPITAL_BASED_OUTPATIENT_CLINIC_OR_DEPARTMENT_OTHER): Payer: Self-pay | Admitting: Family Medicine

## 2022-05-12 ENCOUNTER — Other Ambulatory Visit: Payer: Self-pay | Admitting: Family Medicine

## 2022-05-12 DIAGNOSIS — R221 Localized swelling, mass and lump, neck: Secondary | ICD-10-CM

## 2022-05-12 DIAGNOSIS — M810 Age-related osteoporosis without current pathological fracture: Secondary | ICD-10-CM

## 2022-05-13 DIAGNOSIS — R197 Diarrhea, unspecified: Secondary | ICD-10-CM | POA: Diagnosis not present

## 2022-05-15 ENCOUNTER — Ambulatory Visit (HOSPITAL_BASED_OUTPATIENT_CLINIC_OR_DEPARTMENT_OTHER)
Admission: RE | Admit: 2022-05-15 | Discharge: 2022-05-15 | Disposition: A | Discharge status: Home / Self Care | Payer: PPO | Source: Ambulatory Visit | Attending: Family Medicine | Admitting: Family Medicine

## 2022-05-15 DIAGNOSIS — R221 Localized swelling, mass and lump, neck: Secondary | ICD-10-CM | POA: Insufficient documentation

## 2022-05-18 DIAGNOSIS — M79644 Pain in right finger(s): Secondary | ICD-10-CM | POA: Diagnosis not present

## 2022-05-19 ENCOUNTER — Encounter: Payer: Self-pay | Admitting: Acute Care

## 2022-05-19 ENCOUNTER — Ambulatory Visit: Payer: PPO | Admitting: Acute Care

## 2022-05-19 ENCOUNTER — Other Ambulatory Visit: Payer: Self-pay | Admitting: Acute Care

## 2022-05-19 VITALS — BP 112/68 | HR 90 | Temp 98.2°F | Ht 59.0 in | Wt 119.8 lb

## 2022-05-19 DIAGNOSIS — J441 Chronic obstructive pulmonary disease with (acute) exacerbation: Secondary | ICD-10-CM

## 2022-05-19 DIAGNOSIS — Z87891 Personal history of nicotine dependence: Secondary | ICD-10-CM

## 2022-05-19 MED ORDER — PREDNISONE 10 MG PO TABS
ORAL_TABLET | ORAL | 0 refills | Status: DC
Start: 2022-05-19 — End: 2022-06-16

## 2022-05-19 MED ORDER — DOXYCYCLINE HYCLATE 100 MG PO TABS
100.0000 mg | ORAL_TABLET | Freq: Two times a day (BID) | ORAL | 0 refills | Status: DC
Start: 2022-05-19 — End: 2022-06-16

## 2022-05-19 NOTE — Patient Instructions (Signed)
It is good to see you today. We will treat you for a COPD flare. I have sent in a prednisone taper. Prednisone taper; 10 mg tablets: 4 tabs x 2 days, 3 tabs x 2 days, 2 tabs x 2 days 1 tab x 2 days then stop.  I have also sent in Doxycycline . Take 1 tablet twice daily for 7 days. Take with full glass of water . Wear sun block if outside. Take probiotic with antibiotic, and continue as your PCP asked.  I like Culturelle Probiotics.  You can get these at the drug store over the counter. Also start Mucinex 1200 mg each morning, with a full glass of water. Use flutter valve several times a day for chest congestion Follow up in 1-2 weeks with Maralyn Sago NP or Rubye Oaks NP to ensure you are better. Please contact office for sooner follow up if symptoms do not improve or worsen or seek emergency care

## 2022-05-19 NOTE — Progress Notes (Signed)
History of Present Illness Kimberly [name] is a 72 y.o. female former smoker (quit 01/12/2019 with a 45-pack-year smoking history) followed for COPD  Medical history significant for depression, hyperlipidemia, hypertension . She is followed by Dr. Wynona Neat and Tammy Parrett NP   05/19/2022 Pt. Presents for an acute visit. She was last seen 05/07/2022 by Rubye Oaks NP.  At that time she had restarted her Spiriva, but was otherwise doing well. She does participate in the lung cancer screening program and is due screening 08/2022. She quit smoking 08/2020. She is on an ACE inhibitor.   Pt. States she has a dry non productive cough , and worsening shortness of breath. What little she does  cough up was yellow and thick.  She states this has been ongoing for about 2 weeks.  She denies any fever, she states she is using her Spiriva 2 puffs daily. She has had increased use of her rescue with having to use it twice daily. She usually does not have to use this at all..She does have some wheezing with exertion, having to use her rescue after showering last night.    Test Results:  High-resolution CT chest August 11, 2019 - for interstitial lung disease, mild diffuse bilateral bronchial wall thickening with innumerable tiny centrilobular nodules most numerous and the lung apices consistent with a smoking-related bronchitis/respiratory bronchiolitis, 3 mm right upper lobe nodule.  Atherosclerosis noted, previous pleural effusions resolved     Autoimmune/connective tissue labs negative except for sed rate elevated at 66 and ANA positive , 1: 40, cytoplasmic, CCP negative     2D echo showed a preserved EF and normal pulmonary artery pressures with grade 1 diastolic dysfunction.   Pulmonary function testing September 13, 2019 moderate obstruction with FEV1 at 64%, ratio 67, FVC 71%, significant bronchodilator response.         Latest Ref Rng & Units 01/15/2020    5:28 AM 01/14/2020    2:49 AM 01/13/2020    4:07  AM  CBC  WBC 4.0 - 10.5 K/uL 14.6  9.7  7.5   Hemoglobin 12.0 - 15.0 g/dL 16.1  09.6  04.5   Hematocrit 36.0 - 46.0 % 39.3  37.8  39.2   Platelets 150 - 400 K/uL 298  272  263        Latest Ref Rng & Units 01/15/2020    5:28 AM 01/14/2020    2:49 AM 01/13/2020    4:07 AM  BMP  Glucose 70 - 99 mg/dL 409  811  914   BUN 8 - 23 mg/dL 28  31  24    Creatinine 0.44 - 1.00 mg/dL 7.82  9.56  2.13   Sodium 135 - 145 mmol/L 139  140  140   Potassium 3.5 - 5.1 mmol/L 4.0  3.8  4.0   Chloride 98 - 111 mmol/L 108  106  103   CO2 22 - 32 mmol/L 20  21  23    Calcium 8.9 - 10.3 mg/dL 9.2  9.3  9.6     BNP    Component Value Date/Time   BNP 24.7 05/12/2019 1048    ProBNP No results found for: "PROBNP"  PFT    Component Value Date/Time   FEV1PRE 0.91 09/13/2019 0942   FEV1POST 1.18 09/13/2019 0942   FVCPRE 1.26 09/13/2019 0942   FVCPOST 1.75 09/13/2019 0942   TLC 4.23 09/13/2019 0942   DLCOUNC 15.57 09/13/2019 0942   PREFEV1FVCRT 72 09/13/2019 0942   PSTFEV1FVCRT 67  09/13/2019 0942    DG BONE DENSITY (DXA)  Result Date: 04/28/2022 EXAM: DUAL X-RAY ABSORPTIOMETRY (DXA) FOR BONE MINERAL DENSITY IMPRESSION: Referring Physician:  Lupita Raider Your patient completed a bone mineral density test using GE Lunar iDXA system (analysis version: 16). Technologist:    lmn PATIENT: Name: Generra, Gradwell Patient ID: [id number] Birth Date: [birth year] Height: 58.2 in. Sex: Female Measured: 04/28/2022 Weight: 119.8 lbs. Indications: Advanced Age, Albuterol, COPD, Depression, Hysterectomy, Omeprazole, Venlafaxine, Secondary Osteoporosis Fractures: NONE Treatments: Calcium (E943.0), Prolia, Vitamin D (E933.5) ASSESSMENT: The BMD measured at Femur Neck Left is 0.712 g/cm2 with a T-score of -2.3. This patient is considered osteopenic/low bone mass according to World Health Organization [facility]) criteria. The quality of the exam is good. At the request of the provider, FRAX has been calculated. Site Region Measured  Date Measured Age YA BMD Significant CHANGE T-score DualFemur Neck Left  4/28/2022    [age]         -2.3    0.712 g/cm2 AP Spine  L1-L4      04/28/2022    [age]         -1.0    1.061 g/cm2 DualFemur Total Mean 04/28/2022    [age]         -1.2    0.861 g/cm2 World Health Organization [facility]) criteria for post-menopausal, Caucasian Women: Normal       T-score at or above -1 SD Osteopenia   T-score between -1 and -2.5 SD Osteoporosis T-score at or below -2.5 SD RECOMMENDATION: 1. All patients should optimize calcium and vitamin D intake. 2. Consider FDA-approved medical therapies in postmenopausal women and men aged 29 years and older, based on the following: a. A hip or vertebral (clinical or morphometric) fracture. b. T-score = -2.5 at the femoral neck or spine after appropriate evaluation to exclude secondary causes. c. Low bone mass (T-score between -1.0 and -2.5 at the femoral neck or spine) and a 10-year probability of a hip fracture = 3% or a 10-year probability of a major osteoporosis-related fracture = 20% based on the [place]-adapted WHO algorithm. d. Clinician judgment and/or patient preferences may indicate treatment for people with 10-year fracture probabilities above or below these levels. FOLLOW-UP: Patients with diagnosis of osteoporosis or at high risk for fracture should have regular bone mineral density tests.? Patients eligible for Medicare are allowed routine testing every 2 years.? The testing frequency can be increased to one year for patients who have rapidly progressing disease, are receiving or discontinuing medical therapy to restore bone mass, or have additional risk factors. I have reviewed this study and agree with the findings. [facility] FRAX* 10-year Probability of Fracture Based on femoral neck BMD: DualFemur (Left) Major Osteoporotic Fracture: 14.9% Hip Fracture:                3.9% Population:                  [place] (Caucasian) Risk Factors:                Secondary Osteoporosis  *FRAX is a Armed forces logistics/support/administrative officer of the [employer] for Metabolic Bone Disease, a World Science writer (WHO) [employer]. ASSESSMENT: The probability of a major osteoporotic fracture is 14.9% within the next ten years. The probability of a hip fracture is 3.9% within the next ten years. Electronically Signed   By: Romona Curls M.D.   On: 04/28/2022 11:02     Past medical hx Past Medical  History:  Diagnosis Date   Anxiety    Arthritis    osteoarthritis   Bronchitis    Cataract    Complication of anesthesia    s/p bladder retention-"only time it happened"   COPD (chronic obstructive pulmonary disease) (HCC)    Depression    Generalized headaches    Heart murmur    heard occ   Hiatal hernia    small- no problems   History of blood in urine    microscopic -no problems identified," occ." high protein in urine"   History of goiter    Hyperlipemia    Hypertension    Osteoporosis    Sigmoid diverticulosis    pt unaware   Subcutaneous mass    RIGHT [facility] AND LEFT LATERAL CHEST WALL   Wears glasses    Wears glasses    Wears partial dentures    Upper and lower     Social History   Tobacco Use   Smoking status: Former    Packs/day: 1.00    Years: 45.00    Additional pack years: 0.00    Total pack years: 45.00    Types: Cigarettes    Quit date: 01/12/2019    Years since quitting: 3.3   Smokeless tobacco: Never   Tobacco comments:    stopped 01/12/20  Vaping Use   Vaping Use: Never used  Substance Use Topics   Alcohol use: Yes    Comment: socially, once in a while   Drug use: No    Ms.Laursen reports that she quit smoking about 3 years ago. Her smoking use included cigarettes. She has a 45.00 pack-year smoking history. She has never used smokeless tobacco. She reports current alcohol use. She reports that she does not use drugs.  Tobacco Cessation: former smoker (quit 01/12/2019 with a 45-pack-year smoking history)  Past surgical  hx, Family hx, Social hx all reviewed.  Current Outpatient Medications on File Prior to Visit  Medication Sig   acetaminophen (TYLENOL) 500 MG tablet Take 1,000 mg by mouth every 6 (six) hours as needed for moderate pain or headache.    albuterol (VENTOLIN HFA) 108 (90 Base) MCG/ACT inhaler Inhale 2 puffs into the lungs every 4 (four) hours as needed for wheezing or shortness of breath.   calcium carbonate (OSCAL) 1500 (600 Ca) MG TABS tablet Take 600 mg of elemental calcium by mouth daily with breakfast.   Cholecalciferol (VITAMIN D) 1000 UNITS capsule Take 1,000 Units by mouth every morning.   Coenzyme Q10 (COQ10) 100 MG CAPS Take 100 mg by mouth daily.   denosumab (PROLIA) 60 MG/ML SOLN injection Inject 60 mg into the skin every 6 (six) months. Administer in upper arm, thigh, or abdomen   fenofibrate 160 MG tablet Take 160 mg by mouth daily.   lisinopril (ZESTRIL) 20 MG tablet Take 20 mg by mouth daily.   methocarbamol (ROBAXIN) 500 MG tablet Take by mouth at bedtime.   omeprazole (PRILOSEC) 40 MG capsule Take 40 mg by mouth every morning.   Tiotropium Bromide Monohydrate (SPIRIVA RESPIMAT) 2.5 MCG/ACT AERS Inhale 2 puffs into the lungs daily.   valACYclovir (VALTREX) 1000 MG tablet Take 1,000 mg by mouth 2 (two) times daily as needed.   venlafaxine XR (EFFEXOR-XR) 75 MG 24 hr capsule Take 75 mg by mouth daily.   albuterol (VENTOLIN HFA) 108 (90 Base) MCG/ACT inhaler Inhale 2 puffs into the lungs every 6 (six) hours as needed for wheezing or shortness of breath. (Patient not taking: Reported on  05/19/2022)   carvedilol (COREG) 6.25 MG tablet Take 1 tablet (6.25 mg total) by mouth 2 (two) times daily with a meal.   SPIRIVA RESPIMAT 2.5 MCG/ACT AERS INHALE 2 PUFFS BY MOUTH INTO THE LUNGS DAILY (Patient not taking: Reported on 05/19/2022)   No current facility-administered medications on file prior to visit.     Allergies  Allergen Reactions   Crestor [Rosuvastatin] Other (See Comments)     Unknown   Fosamax [Alendronate]     Muscles spasms and cramps   Oxycodone Itching   Penicillins Itching, Rash and Other (See Comments)    Did it involve swelling of the face/tongue/throat, SOB, or low BP? No Did it involve sudden or severe rash/hives, skin peeling, or any reaction on the inside of your mouth or nose? Yes Did you need to seek medical attention at a hospital or doctor's office? Yes When did it last happen? Long time ago If all above answers are "NO", may proceed with cephalosporin use.    Statins     Muscle spasms and cramps   Sulfonamide Derivatives Rash    Review Of Systems:  Constitutional:   No  weight loss, night sweats,  Fevers, chills,+  fatigue, or  lassitude.  HEENT:   No headaches,  Difficulty swallowing,  Tooth/dental problems, or  Sore throat,                No sneezing, itching, ear ache, nasal congestion, post nasal drip,   CV:  No chest pain,  Orthopnea, PND, swelling in lower extremities, anasarca, dizziness, palpitations, syncope.   GI  No heartburn, indigestion, abdominal pain, nausea, vomiting, diarrhea, change in bowel habits, loss of appetite, bloody stools.   Resp: + shortness of breath with exertion less at rest.  No excess mucus, no productive cough,  + non-productive cough,  No coughing up of blood.  No change in color of mucus.  + wheezing intermittent .  No chest wall deformity  Skin: no rash or lesions.  GU: no dysuria, change in color of urine, no urgency or frequency.  No flank pain, no hematuria   MS:  No joint pain or swelling.  No decreased range of motion.  No back pain.  Psych:  No change in mood or affect. No depression or anxiety.  No memory loss.   Vital Signs BP 112/68 (BP Location: Left Arm, Patient Position: Sitting, Cuff Size: Normal)   Pulse 90   Temp 98.2 F (36.8 C) (Oral)   Ht 4\' 11"  (1.499 m)   Wt 119 lb 12.8 oz (54.3 kg)   SpO2 94%   BMI 24.20 kg/m    Physical Exam:  General- No distress,  A&Ox3,  pleasant  ENT: No sinus tenderness, TM clear, pale nasal mucosa, no oral exudate,no post nasal drip, no LAN Cardiac: S1, S2, regular rate and rhythm, no murmur Chest: No wheeze/ rales/ dullness; no accessory muscle use, no nasal flaring, no sternal retractions Abd.: Soft Non-tender, nondistended, bowel sounds positive,Body mass index is 24.2 kg/m.  Ext: No clubbing cyanosis, edema Neuro:  normal strength, moving all extremities x 4, alert and oriented x 3, appropriate Skin: No rashes, warm and dry, no lesions Psych: normal mood and behavior   Assessment/Plan  COPD flare triggered by seasonal allergies Former smoker with a 45-pack-year smoking history Due for lung cancer screening August 2024 Plan We will treat you for a COPD flare. I have sent in a prednisone taper. Prednisone taper; 10 mg tablets: 4 tabs x  2 days, 3 tabs x 2 days, 2 tabs x 2 days 1 tab x 2 days then stop.  I have also sent in Doxycycline . Take 1 tablet twice daily for 7 days. Take with full glass of water . Wear sun block if outside. Take probiotic with antibiotic, and continue as your PCP asked.  I like Culturelle Probiotics.  You can get these at the drug store over the counter. Also start Mucinex 1200 mg each morning, with a full glass of water. Use flutter valve several times a day for chest congestion Follow up in 1-2 weeks with Maralyn Sago NP or Rubye Oaks NP to ensure you are better. Please contact office for sooner follow up if symptoms do not improve or worsen or seek emergency care    I spent 40 minutes dedicated to the care of this patient on the date of this encounter to include pre-visit review of records, face-to-face time with the patient discussing conditions above, post visit ordering of testing, clinical documentation with the electronic health record, making appropriate referrals as documented, and communicating necessary information to the patient's healthcare team.   Bevelyn Ngo, NP 05/19/2022   10:12 AM

## 2022-05-28 ENCOUNTER — Telehealth: Payer: Self-pay | Admitting: *Deleted

## 2022-05-28 NOTE — Telephone Encounter (Signed)
She was supposed to be scheduled for f/u   Follow up in 1-2 weeks with Maralyn Sago NP or Rubye Oaks NP to ensure you are better.   I called and spoke with the pt and scheduled rov with TP for 06/16/22  Nothing further needed

## 2022-05-28 NOTE — Telephone Encounter (Signed)
PT calling to let Ms. Alexandria Lodge know she is much better. Ms. Alexandria Lodge asked that she let us know how she is doing. TY.

## 2022-06-05 ENCOUNTER — Other Ambulatory Visit: Payer: Self-pay | Admitting: Family Medicine

## 2022-06-05 DIAGNOSIS — Z1231 Encounter for screening mammogram for malignant neoplasm of breast: Secondary | ICD-10-CM

## 2022-06-10 DIAGNOSIS — M79644 Pain in right finger(s): Secondary | ICD-10-CM | POA: Diagnosis not present

## 2022-06-11 DIAGNOSIS — K1120 Sialoadenitis, unspecified: Secondary | ICD-10-CM | POA: Diagnosis not present

## 2022-06-11 DIAGNOSIS — K111 Hypertrophy of salivary gland: Secondary | ICD-10-CM | POA: Diagnosis not present

## 2022-06-16 ENCOUNTER — Ambulatory Visit: Payer: PPO | Admitting: Adult Health

## 2022-06-16 ENCOUNTER — Ambulatory Visit (INDEPENDENT_AMBULATORY_CARE_PROVIDER_SITE_OTHER): Payer: PPO

## 2022-06-16 ENCOUNTER — Encounter: Payer: Self-pay | Admitting: Adult Health

## 2022-06-16 VITALS — BP 110/62 | HR 87 | Temp 98.1°F | Ht 59.38 in | Wt 119.6 lb

## 2022-06-16 DIAGNOSIS — J441 Chronic obstructive pulmonary disease with (acute) exacerbation: Secondary | ICD-10-CM

## 2022-06-16 DIAGNOSIS — R059 Cough, unspecified: Secondary | ICD-10-CM | POA: Diagnosis not present

## 2022-06-16 MED ORDER — TRELEGY ELLIPTA 100-62.5-25 MCG/ACT IN AEPB
1.0000 | INHALATION_SPRAY | Freq: Every day | RESPIRATORY_TRACT | 5 refills | Status: DC
Start: 2022-06-16 — End: 2023-01-12

## 2022-06-16 MED ORDER — TRELEGY ELLIPTA 100-62.5-25 MCG/ACT IN AEPB: 1.0000 | INHALATION_SPRAY | Freq: Every day | RESPIRATORY_TRACT | 0 refills | Status: DC

## 2022-06-16 MED ORDER — PREDNISONE 20 MG PO TABS
20.0000 mg | ORAL_TABLET | Freq: Every day | ORAL | 0 refills | Status: DC
Start: 2022-06-16 — End: 2023-03-08

## 2022-06-16 NOTE — Patient Instructions (Addendum)
Chest xray today  Prednisone 20mg  daily for 5 days.  Stop Spiriva  Begin Trelegy 1 puff daily, rinse after use.  Delsym 2 tsp Twice daily  As needed  cough.  Ventolin inhaler As needed  Wheezing /shortness of breath.  Activity as tolerated.  Continue with Lung cancer screening program- Yearly low dose CT   Discuss with Dr. Clelia Croft that Lisinopril may be aggravating your daily cough .  Follow up with Dr. Marchelle Gearing in 3 months with PFT and As needed

## 2022-06-16 NOTE — Progress Notes (Unsigned)
Patient seen in the office today and instructed on use of Trelegy inhaler.  Patient expressed understanding and demonstrated technique. 

## 2022-06-16 NOTE — Progress Notes (Unsigned)
@Patient  ID: Kimberly [name], female    DOB: 05-27-1950, 72 y.o.   MRN: [id number]  Chief Complaint  Patient presents with   Follow-up    Referring provider: Lupita Raider, MD  HPI: 72 year old female former smoker followed for COPD High-resolution CT chest August 11, 2019 - for interstitial lung disease, mild diffuse bilateral bronchial wall thickening with innumerable tiny centrilobular nodules most numerous and the lung apices consistent with a smoking-related bronchitis/respiratory bronchiolitis, 3 mm right upper lobe nodule.  Atherosclerosis noted, previous pleural effusions resolved     Autoimmune/connective tissue labs negative except for sed rate elevated at 66 and ANA positive , 1: 40, cytoplasmic, CCP negative     2D echo showed a preserved EF and normal pulmonary artery pressures with grade 1 diastolic dysfunction.   Pulmonary function testing September 13, 2019 moderate obstruction with FEV1 at 64%, ratio 67, FVC 71%, significant bronchodilator response. Trial   06/16/2022 Follow up: COPD  Patient presents for an acute office visit.  Patient complains over the last several weeks that her cough has picked up.  Complains of a dry cough throughout the day.  Says that Spiriva is not helping.  Feels that her breathing is not doing as well.  Patient denies any fever or discolored mucus.  No chest pain orthopnea PND or increased leg swelling.  Patient is on ACE inhibitor.  Has been on this for many years.  She has not been using any cough medicines over-the-counter.  Albuterol is not helping.  She does participate in the lung cancer CT screening program.  CT August 2023 showed no acute or suspicious areas  Allergies  Allergen Reactions   Crestor [Rosuvastatin] Other (See Comments)    Unknown   Fosamax [Alendronate]     Muscles spasms and cramps   Oxycodone Itching   Penicillins Itching, Rash and Other (See Comments)    Did it involve swelling of the face/tongue/throat, SOB, or  low BP? No Did it involve sudden or severe rash/hives, skin peeling, or any reaction on the inside of your mouth or nose? Yes Did you need to seek medical attention at a hospital or doctor's office? Yes When did it last happen? Long time ago If all above answers are "NO", may proceed with cephalosporin use.    Statins     Muscle spasms and cramps   Sulfonamide Derivatives Rash    Immunization History  Administered Date(s) Administered   Influenza Split 11/13/2011, 10/12/2012, 09/25/2019   Influenza, High Dose Seasonal PF 09/13/2018   Influenza,inj,quad, With Preservative 09/26/2016   Influenza-Unspecified 11/12/2020   Pneumococcal Conjugate-13 03/22/2015   Pneumococcal Polysaccharide-23 01/04/2012, 05/21/2016   Pneumococcal-Unspecified 11/12/2016   Zoster, Live 09/25/2019    Past Medical History:  Diagnosis Date   Anxiety    Arthritis    osteoarthritis   Bronchitis    Cataract    Complication of anesthesia    s/p bladder retention-"only time it happened"   COPD (chronic obstructive pulmonary disease) (HCC)    Depression    Generalized headaches    Heart murmur    heard occ   Hiatal hernia    small- no problems   History of blood in urine    microscopic -no problems identified," occ." high protein in urine"   History of goiter    Hyperlipemia    Hypertension    Osteoporosis    Sigmoid diverticulosis    pt unaware   Subcutaneous mass    RIGHT FKANK AND LEFT LATERAL CHEST  WALL   Wears glasses    Wears glasses    Wears partial dentures    Upper and lower    Tobacco History: Social History   Tobacco Use  Smoking Status Former   Packs/day: 1.00   Years: 45.00   Additional pack years: 0.00   Total pack years: 45.00   Types: Cigarettes   Quit date: 01/12/2019   Years since quitting: 3.4  Smokeless Tobacco Never  Tobacco Comments   stopped 01/12/20   Counseling given: Not Answered Tobacco comments: stopped 01/12/20   Outpatient Medications Prior to  Visit  Medication Sig Dispense Refill   acetaminophen (TYLENOL) 500 MG tablet Take 1,000 mg by mouth every 6 (six) hours as needed for moderate pain or headache.      albuterol (VENTOLIN HFA) 108 (90 Base) MCG/ACT inhaler Inhale 2 puffs into the lungs every 4 (four) hours as needed for wheezing or shortness of breath.     albuterol (VENTOLIN HFA) 108 (90 Base) MCG/ACT inhaler Inhale 2 puffs into the lungs every 6 (six) hours as needed for wheezing or shortness of breath. 18 g 3   calcium carbonate (OSCAL) 1500 (600 Ca) MG TABS tablet Take 600 mg of elemental calcium by mouth daily with breakfast.     Cholecalciferol (VITAMIN D) 1000 UNITS capsule Take 1,000 Units by mouth every morning.     Coenzyme Q10 (COQ10) 100 MG CAPS Take 100 mg by mouth daily.     denosumab (PROLIA) 60 MG/ML SOLN injection Inject 60 mg into the skin every 6 (six) months. Administer in upper arm, thigh, or abdomen     fenofibrate 160 MG tablet Take 160 mg by mouth daily.     lisinopril (ZESTRIL) 20 MG tablet Take 20 mg by mouth daily.     methocarbamol (ROBAXIN) 500 MG tablet Take by mouth at bedtime.     omeprazole (PRILOSEC) 40 MG capsule Take 40 mg by mouth every morning.     SPIRIVA RESPIMAT 2.5 MCG/ACT AERS INHALE 2 PUFFS BY MOUTH INTO THE LUNGS DAILY 4 g 2   Tiotropium Bromide Monohydrate (SPIRIVA RESPIMAT) 2.5 MCG/ACT AERS Inhale 2 puffs into the lungs daily. 3 each 3   valACYclovir (VALTREX) 1000 MG tablet Take 1,000 mg by mouth 2 (two) times daily as needed.     venlafaxine XR (EFFEXOR-XR) 75 MG 24 hr capsule Take 75 mg by mouth daily.     carvedilol (COREG) 6.25 MG tablet Take 1 tablet (6.25 mg total) by mouth 2 (two) times daily with a meal. 60 tablet 2   doxycycline (VIBRA-TABS) 100 MG tablet Take 1 tablet (100 mg total) by mouth 2 (two) times daily. (Patient not taking: Reported on 06/16/2022) 14 tablet 0   predniSONE (DELTASONE) 10 MG tablet Prednisone taper; 10 mg tablets: 4 tabs x 2 days, 3 tabs x 2 days, 2  tabs x 2 days 1 tab x 2 days then stop. (Patient not taking: Reported on 06/16/2022) 20 tablet 0   No facility-administered medications prior to visit.     Review of Systems:   Constitutional:   No  weight loss, night sweats,  Fevers, chills, fatigue, or  lassitude.  HEENT:   No headaches,  Difficulty swallowing,  Tooth/dental problems, or  Sore throat,                No sneezing, itching, ear ache, nasal congestion, post nasal drip,   CV:  No chest pain,  Orthopnea, PND, swelling in lower extremities, anasarca, dizziness,  palpitations, syncope.   GI  No heartburn, indigestion, abdominal pain, nausea, vomiting, diarrhea, change in bowel habits, loss of appetite, bloody stools.   Resp: No chest wall deformity  Skin: no rash or lesions.  GU: no dysuria, change in color of urine, no urgency or frequency.  No flank pain, no hematuria   MS:  No joint pain or swelling.  No decreased range of motion.  No back pain.    Physical Exam  BP 110/62 (BP Location: Left Arm, Patient Position: Sitting, Cuff Size: Normal)   Pulse 87   Temp 98.1 F (36.7 C) (Oral)   Ht 4' 11.38" (1.508 m)   Wt 119 lb 9.6 oz (54.3 kg)   SpO2 93%   BMI 23.85 kg/m   GEN: A/Ox3; pleasant , NAD, well nourished    HEENT:  /AT,  NOSE-clear, THROAT-clear, no lesions, no postnasal drip or exudate noted.   NECK:  Supple w/ fair ROM; no JVD; normal carotid impulses w/o bruits; no thyromegaly or nodules palpated; no lymphadenopathy.    RESP  Clear  P & A; w/o, wheezes/ rales/ or rhonchi. no accessory muscle use, no dullness to percussion  CARD:  RRR, no m/r/g, no peripheral edema, pulses intact, no cyanosis or clubbing.  GI:   Soft & nt; nml bowel sounds; no organomegaly or masses detected.   Musco: Warm bil, no deformities or joint swelling noted.   Neuro: alert, no focal deficits noted.    Skin: Warm, no lesions or rashes    Lab Results:   BMET   ProBNP No results found for:  "PROBNP"  Imaging: No results found.       Latest Ref Rng & Units 09/13/2019    9:42 AM  PFT Results  FVC-Pre L 1.26   FVC-Predicted Pre % 51   FVC-Post L 1.75   FVC-Predicted Post % 71   Pre FEV1/FVC % % 72   Post FEV1/FCV % % 67   FEV1-Pre L 0.91   FEV1-Predicted Pre % 49   FEV1-Post L 1.18   DLCO uncorrected ml/min/mmHg 15.57   DLCO UNC% % 95   DLCO corrected ml/min/mmHg 15.04   DLCO COR %Predicted % 91   DLVA Predicted % 99   TLC L 4.23   TLC % Predicted % 98   RV % Predicted % 142     No results found for: "NITRICOXIDE"      Assessment & Plan:   No problem-specific Assessment & Plan notes found for this encounter.     Rubye Oaks, NP 06/16/2022

## 2022-06-17 NOTE — Assessment & Plan Note (Signed)
Acute COPD exacerbation.  Will treat with short course of steroids.  Changes Spiriva to Trelegy.  May need to change ACE inhibitor as may be aggravating her chronic cough Cough control regimen discussed.  Chest x-ray today.  Plan Patient Instructions  Chest xray today  Prednisone 20mg  daily for 5 days.  Stop Spiriva  Begin Trelegy 1 puff daily, rinse after use.  Delsym 2 tsp Twice daily  As needed  cough.  Ventolin inhaler As needed  Wheezing /shortness of breath.  Activity as tolerated.  Continue with Lung cancer screening program- Yearly low dose CT   Discuss with Dr. Clelia Croft that Lisinopril may be aggravating your daily cough .  Follow up with Dr. Marchelle Gearing in 3 months with PFT and As needed

## 2022-07-14 ENCOUNTER — Ambulatory Visit
Admission: RE | Admit: 2022-07-14 | Discharge: 2022-07-14 | Disposition: A | Discharge status: Home / Self Care | Payer: PPO | Source: Ambulatory Visit | Attending: Family Medicine | Admitting: Family Medicine

## 2022-07-14 DIAGNOSIS — Z1231 Encounter for screening mammogram for malignant neoplasm of breast: Secondary | ICD-10-CM

## 2022-08-20 DIAGNOSIS — M1712 Unilateral primary osteoarthritis, left knee: Secondary | ICD-10-CM | POA: Diagnosis not present

## 2022-08-20 DIAGNOSIS — M25561 Pain in right knee: Secondary | ICD-10-CM | POA: Diagnosis not present

## 2022-08-20 DIAGNOSIS — Z96652 Presence of left artificial knee joint: Secondary | ICD-10-CM | POA: Diagnosis not present

## 2022-09-07 ENCOUNTER — Ambulatory Visit (HOSPITAL_BASED_OUTPATIENT_CLINIC_OR_DEPARTMENT_OTHER)
Admission: RE | Admit: 2022-09-07 | Discharge: 2022-09-07 | Disposition: A | Discharge status: Home / Self Care | Payer: PPO | Source: Ambulatory Visit | Attending: Acute Care | Admitting: Acute Care

## 2022-09-07 DIAGNOSIS — Z87891 Personal history of nicotine dependence: Secondary | ICD-10-CM | POA: Diagnosis not present

## 2022-09-07 DIAGNOSIS — Z122 Encounter for screening for malignant neoplasm of respiratory organs: Secondary | ICD-10-CM | POA: Diagnosis not present

## 2022-09-07 DIAGNOSIS — F17210 Nicotine dependence, cigarettes, uncomplicated: Secondary | ICD-10-CM | POA: Insufficient documentation

## 2022-09-08 ENCOUNTER — Ambulatory Visit (INDEPENDENT_AMBULATORY_CARE_PROVIDER_SITE_OTHER): Payer: PPO | Admitting: Internal Medicine

## 2022-09-08 ENCOUNTER — Ambulatory Visit: Payer: PPO | Admitting: Internal Medicine

## 2022-09-08 ENCOUNTER — Encounter: Payer: Self-pay | Admitting: Internal Medicine

## 2022-09-08 VITALS — BP 120/70 | HR 73 | Ht <= 58 in | Wt 123.8 lb

## 2022-09-08 DIAGNOSIS — Z7185 Encounter for immunization safety counseling: Secondary | ICD-10-CM | POA: Diagnosis not present

## 2022-09-08 DIAGNOSIS — J449 Chronic obstructive pulmonary disease, unspecified: Secondary | ICD-10-CM | POA: Diagnosis not present

## 2022-09-08 DIAGNOSIS — J441 Chronic obstructive pulmonary disease with (acute) exacerbation: Secondary | ICD-10-CM | POA: Diagnosis not present

## 2022-09-08 DIAGNOSIS — Z122 Encounter for screening for malignant neoplasm of respiratory organs: Secondary | ICD-10-CM

## 2022-09-08 LAB — PULMONARY FUNCTION TEST
DL/VA % pred: 106 %
DL/VA: 4.57 ml/min/mmHg/L
DLCO cor % pred: 92 %
DLCO cor: 14.98 ml/min/mmHg
DLCO unc % pred: 92 %
DLCO unc: 14.98 ml/min/mmHg
FEF 25-75 Post: 0.34 L/s
FEF 25-75 Pre: 0.53 L/s
FEF2575-%Change-Post: -35 %
FEF2575-%Pred-Post: 22 %
FEF2575-%Pred-Pre: 34 %
FEV1-%Change-Post: -8 %
FEV1-%Pred-Post: 57 %
FEV1-%Pred-Pre: 63 %
FEV1-Post: 1.01 L
FEV1-Pre: 1.11 L
FEV1FVC-%Change-Post: 6 %
FEV1FVC-%Pred-Pre: 86 %
FEV6-%Change-Post: -13 %
FEV6-%Pred-Post: 65 %
FEV6-%Pred-Pre: 75 %
FEV6-Post: 1.45 L
FEV6-Pre: 1.67 L
FEV6FVC-%Change-Post: 2 %
FEV6FVC-%Pred-Post: 105 %
FEV6FVC-%Pred-Pre: 103 %
FVC-%Change-Post: -14 %
FVC-%Pred-Post: 61 %
FVC-%Pred-Pre: 72 %
FVC-Post: 1.45 L
FVC-Pre: 1.7 L
Post FEV1/FVC ratio: 70 %
Post FEV6/FVC ratio: 100 %
Pre FEV1/FVC ratio: 65 %
Pre FEV6/FVC Ratio: 98 %
RV % pred: 148 %
RV: 2.93 L
TLC % pred: 102 %
TLC: 4.4 L

## 2022-09-08 NOTE — Patient Instructions (Addendum)
ICD-10-CM   1. Stage 2 moderate COPD by GOLD classification (HCC)  J44.9     2. COPD, frequent exacerbations (HCC)  J44.1     3. Vaccine counseling  Z71.85     4. Screening for malignant neoplasm of respiratory organ  Z12.2      Stage 2 moderate COPD by GOLD classification (HCC) COPD, frequent exacerbations (HCC)  -glad better after swith of spiriva to trelegy  - lung function improved and now in Moderate CAtegory  Plan  - continue trelegy scheduled - do albuteroal as needed - check alpha 1 AT phenotype  Vaccine counseling  Plan  - high dose flu shot in fall - RSV vaccine anytime now (made similar to flu shot) -  Please talk to PCP Lupita Raider, MD -  and ensure you get  shingrix (GSK) inactivated vaccine against shingles   Screening for malignant neoplasm of respiratory organ  Plan  - await results of LDCT 09/07/22 but to me loks like followup CT in a year   Folowup  - 6 months or sooner if needed

## 2022-09-08 NOTE — Patient Instructions (Signed)
Full PFT performed today. °

## 2022-09-08 NOTE — Progress Notes (Signed)
IOV June 2021 with Dr Marchelle Gearing    OV 06/22/2019  Subjective:  Patient ID: Kimberly [name], female , DOB: Apr 25, 1950 , age 72 y.o. , MRN: [id number] , ADDRESS: [address] [place] [address]   06/22/2019 -   Chief Complaint  Patient presents with   Consult    Pt being referred by Dr. Clelia Croft for evaluation of COPD. Pt has an occ cough with clear phlegm, SOB with activities, and also has occ chest discomfort.     HPI Kimberly [name] 72 y.o. -presents with her husband.  She is an active smoker.  She has not taken the Covid vaccine because of fear of side effects.  She and her husband believe that in late 2019 she had really significant respiratory infection that she believes she might of had Covid at that time.  Nevertheless approximately 4 - 5 years ago primary care physician given a diagnosis of COPD.  She has been on albuterol as needed as needed.  However she says that she never had symptoms back then.  Was diagnosed with made on physical exam according to her history.  Then starting approximately over a year ago she developed insidious onset of chronic cough that has persisted.  She is on lisinopril for several years but she does not believe is the cause of the cough.  The cough itself is mild.  She does bring up some phlegm in her chest.  She also has some chest tightness.  She has relative dyspnea for climbing flights of stairs but the dyspnea itself is mild.  There is also fluctuations day-to-day on the symptoms.  There are days when she feels good there are days she feels bad.  The average COPD CAT score symptomatology burden is listed below.  Then approximately May 12, 2018 when she developed bilateral pleuritic chest pain in the infra axillary area.  This resulted in the ER visit.  I reviewed the ER records.  She had CT scan of the chest that shows small bilateral effusions and biapical emphysema.  Otherwise no evidence of ILD or cancer.  There is BNP and troponin that were  normal.  We do not have echo report.  I personally visualized the CT images.  Since then the pleurisy has resolved but she is worried about the pleural effusion.  She also has persistent shortness of breath and cough as described below.  She is interested in getting to the bottom of her symptoms  Regarding Covid vaccination: She is worried about the side effects of the vaccine.  Therefore she and her husband will not take the vaccine.  06/16/2022 Follow up: COPD  Patient presents for an acute office visit.  Patient complains over the last several weeks that her cough has picked up.  Complains of a dry cough throughout the day.  Says that Spiriva is not helping.  Feels that her breathing is not doing as well.  Patient denies any fever or discolored mucus.  No chest pain orthopnea PND or increased leg swelling.  Patient is on ACE inhibitor.  Has been on this for many years.  She has not been using any cough medicines over-the-counter.  Albuterol is not helping.  She does participate in the lung cancer CT screening program.  CT August 2023 showed no acute or suspicious areas     High-resolution CT chest August 11, 2019 - for interstitial lung disease, mild diffuse bilateral bronchial wall thickening with innumerable tiny centrilobular nodules most numerous and the  lung apices consistent with a smoking-related bronchitis/respiratory bronchiolitis, 3 mm right upper lobe nodule.  Atherosclerosis noted, previous pleural effusions resolved     Autoimmune/connective tissue labs negative except for sed rate elevated at 66 and ANA positive , 1: 40, cytoplasmic, CCP negative     2D echo showed a preserved EF and normal pulmonary artery pressures with grade 1 diastolic dysfunction.   Pulmonary function testing September 13, 2019 moderate obstruction with FEV1 at 64%, ratio 67, FVC 71%, significant bronchodilator response. Trial  Chest xray today  Prednisone 20mg  daily for 5 days.  Stop Spiriva  Begin Trelegy 1  puff daily, rinse after use.  Delsym 2 tsp Twice daily  As needed  cough.  Ventolin inhaler As needed  Wheezing /shortness of breath.  Activity as tolerated.  Continue with Lung cancer screening program- Yearly low dose CT   Discuss with Dr. Clelia Croft that Lisinopril may be aggravating your daily cough .  Follow up with Dr. Marchelle Gearing in 3 months with PFT and As needed    OV 09/08/2022  Subjective:  Patient ID: Kimberly [name], female , DOB: 1950-08-06 , age 72 y.o. , MRN: [id number] , ADDRESS: [address] [place] [address] PCP Lupita Raider, MD Patient Care Team: Lupita Raider, MD as PCP - General (Family Medicine) Alvan Dame, DPM as Consulting Physician (Podiatry)  This Provider for this visit: Treatment Team:  Attending Provider: Kalman Shan, MD    09/08/2022 -   Chief Complaint  Patient presents with   Follow-up    F/up on PFT     HPI Kimberly [name] 72 y.o. -Personally last seen by me in 2021.  last visti with APP in June 2024 had AECOPD and given prednisone. Spiriva changed to trelegy and asked to dc lisinopri. Since then had Screening CT chest - results pending. AT this visit she tells me-since starting Trelegy she feels a lot better.  She has had no further exacerbations.  She feels Trelegy inhaler is helping her a lot.  Hyphema and is also improved.  She had a low-dose CT scan of the chest yesterday but the results are pending.    CT Chest data from date: *09/07/22 - official report pending . IT is a LDCT for lung cance sscreen  - personally visualized and independently interpreted : YES - my findings are: NO CANCER.    PFT     Latest Ref Rng & Units 09/08/2022    9:48 AM 09/13/2019    9:42 AM  PFT Results  FVC-Pre L 1.70  P 1.26   FVC-Predicted Pre % 72  P 51   FVC-Post L 1.45  P 1.75   FVC-Predicted Post % 61  P 71   Pre FEV1/FVC % % 65  P 72   Post FEV1/FCV % % 70  P 67   FEV1-Pre L 1.11  P 0.91   FEV1-Predicted Pre % 63  P 49    FEV1-Post L 1.01  P 1.18   DLCO uncorrected ml/min/mmHg 14.98  P 15.57   DLCO UNC% % 92  P 95   DLCO corrected ml/min/mmHg 14.98  P 15.04   DLCO COR %Predicted % 92  P 91   DLVA Predicted % 106  P 99   TLC L 4.40  P 4.23   TLC % Predicted % 102  P 98   RV % Predicted % 148  P 142     P Preliminary result      LAB RESULTS  last 96 hours No results found.  LAB RESULTS last 90 days Recent Results (from the past 2160 hour(s))  Pulmonary Function Test     Status: None (Preliminary result)   Collection Time: 09/08/22  9:48 AM  Result Value Ref Range   FVC-Pre 1.70 L   FVC-%Pred-Pre 72 %   FVC-Post 1.45 L   FVC-%Pred-Post 61 %   FVC-%Change-Post -14 %   FEV1-Pre 1.11 L   FEV1-%Pred-Pre 63 %   FEV1-Post 1.01 L   FEV1-%Pred-Post 57 %   FEV1-%Change-Post -8 %   FEV6-Pre 1.67 L   FEV6-%Pred-Pre 75 %   FEV6-Post 1.45 L   FEV6-%Pred-Post 65 %   FEV6-%Change-Post -13 %   Pre FEV1/FVC ratio 65 %   FEV1FVC-%Pred-Pre 86 %   Post FEV1/FVC ratio 70 %   FEV1FVC-%Change-Post 6 %   Pre FEV6/FVC Ratio 98 %   FEV6FVC-%Pred-Pre 103 %   Post FEV6/FVC ratio 100 %   FEV6FVC-%Pred-Post 105 %   FEV6FVC-%Change-Post 2 %   FEF 25-75 Pre 0.53 L/sec   FEF2575-%Pred-Pre 34 %   FEF 25-75 Post 0.34 L/sec   FEF2575-%Pred-Post 22 %   FEF2575-%Change-Post -35 %   RV 2.93 L   RV % pred 148 %   TLC 4.40 L   TLC % pred 102 %   DLCO unc 14.98 ml/min/mmHg   DLCO unc % pred 92 %   DLCO cor 14.98 ml/min/mmHg   DLCO cor % pred 92 %   DL/VA 1.61 ml/min/mmHg/L   DL/VA % pred 096 %      Latest Reference Range & Units 01/14/20 02:49 01/15/20 05:28  Eosinophils Absolute 0.0 - 0.5 K/uL 0.0 0.0      has a past medical history of Anxiety, Arthritis, Bronchitis, Cataract, Complication of anesthesia, COPD (chronic obstructive pulmonary disease) (HCC), Depression, Generalized headaches, Heart murmur, Hiatal hernia, History of blood in urine, History of goiter, Hyperlipemia, Hypertension, Osteoporosis,  Sigmoid diverticulosis, Subcutaneous mass, Wears glasses, Wears glasses, and Wears partial dentures.   reports that she quit smoking about 3 years ago. Her smoking use included cigarettes. She started smoking about 48 years ago. She has a 45 pack-year smoking history. She has never used smokeless tobacco.  Past Surgical History:  Procedure Laterality Date   BREAST EXCISIONAL BIOPSY Right pt unsure   fibrocystic tissue   BREAST EXCISIONAL BIOPSY Left pt unsure   scarring   BREAST SURGERY  [phone]   x3 fibrocystic disease, radial scarring   BUNIONECTOMY Right    CATARACT EXTRACTION, BILATERAL     COLONOSCOPY  2011   ESOPHAGOGASTRODUODENOSCOPY  2008   HAMMER TOE SURGERY Right    4,5   HAND SURGERY Left    x 2 digits"repair fron cuts of tendons" "caught in door"   KNEE ARTHROSCOPY Left    torn cartilage   LESION REMOVAL N/A 09/28/2013   Procedure: REMOVAL OF POSTERIOR NECK MASS;  Surgeon: Karie Soda, MD;  Location: [facility];  Service: General;  Laterality: N/A;   LIPOMA EXCISION  [id number]   x4 -1 abd, 1 rt.. arm, 1 bil. thigh   MASS EXCISION N/A 07/26/2018   Procedure: REMOVAL OF RIGHT FLANK , LEFT LATERAL CHEST WALL AND RIGHT THIGH SUBCUTANEOUS MASSES;  Surgeon: Karie Soda, MD;  Location: [facility];  Service: General;  Laterality: N/A;   PARTIAL HYSTERECTOMY  1984   abnormal bleeding   PARTIAL KNEE ARTHROPLASTY Left    THYROID LOBECTOMY Left 1970's   UPPER GASTROINTESTINAL ENDOSCOPY  Allergies  Allergen Reactions   Crestor [Rosuvastatin] Other (See Comments)    Unknown   Fosamax [Alendronate]     Muscles spasms and cramps   Oxycodone Itching   Penicillins Itching, Rash and Other (See Comments)    Did it involve swelling of the face/tongue/throat, SOB, or low BP? No Did it involve sudden or severe rash/hives, skin peeling, or any reaction on the inside of your mouth or nose? Yes Did you need to seek medical attention at a hospital or doctor's office?  Yes When did it last happen? Long time ago If all above answers are "NO", may proceed with cephalosporin use.    Statins     Muscle spasms and cramps   Sulfonamide Derivatives Rash    Immunization History  Administered Date(s) Administered   Influenza Split 11/13/2011, 10/12/2012, 09/25/2019   Influenza, High Dose Seasonal PF 09/13/2018   Influenza,inj,quad, With Preservative 09/26/2016   Influenza-Unspecified 11/12/2020   Pneumococcal Conjugate-13 03/22/2015   Pneumococcal Polysaccharide-23 01/04/2012, 05/21/2016   Pneumococcal-Unspecified 11/12/2016   Zoster, Live 09/25/2019    Family History  Problem Relation Age of Onset   Heart disease Father    Heart disease Sister        cardiac arrest   Lymphoma Brother    Colon cancer Paternal Grandmother    Esophageal cancer Neg Hx    Rectal cancer Neg Hx    Stomach cancer Neg Hx      Current Outpatient Medications:    acetaminophen (TYLENOL) 500 MG tablet, Take 1,000 mg by mouth every 6 (six) hours as needed for moderate pain or headache. , Disp: , Rfl:    albuterol (VENTOLIN HFA) 108 (90 Base) MCG/ACT inhaler, Inhale 2 puffs into the lungs every 6 (six) hours as needed for wheezing or shortness of breath., Disp: 18 g, Rfl: 3   calcium carbonate (OSCAL) 1500 (600 Ca) MG TABS tablet, Take 600 mg of elemental calcium by mouth daily with breakfast., Disp: , Rfl:    Cholecalciferol (VITAMIN D) 1000 UNITS capsule, Take 1,000 Units by mouth every morning., Disp: , Rfl:    Coenzyme Q10 (COQ10) 100 MG CAPS, Take 100 mg by mouth daily., Disp: , Rfl:    denosumab (PROLIA) 60 MG/ML SOLN injection, Inject 60 mg into the skin every 6 (six) months. Administer in upper arm, thigh, or abdomen, Disp: , Rfl:    fenofibrate 160 MG tablet, Take 160 mg by mouth daily., Disp: , Rfl:    Fluticasone-Umeclidin-Vilant (TRELEGY ELLIPTA) 100-62.5-25 MCG/ACT AEPB, Inhale 1 puff into the lungs daily., Disp: 1 each, Rfl: 5   losartan (COZAAR) 50 MG tablet,  Take 50 mg by mouth daily., Disp: , Rfl:    omeprazole (PRILOSEC) 40 MG capsule, Take 40 mg by mouth every morning., Disp: , Rfl:    Pitavastatin Magnesium (ZYPITAMAG) 4 MG TABS, Take 4 mg by mouth daily., Disp: , Rfl:    valACYclovir (VALTREX) 1000 MG tablet, Take 1,000 mg by mouth 2 (two) times daily as needed., Disp: , Rfl:    venlafaxine XR (EFFEXOR-XR) 75 MG 24 hr capsule, Take 75 mg by mouth daily., Disp: , Rfl:    carvedilol (COREG) 6.25 MG tablet, Take 1 tablet (6.25 mg total) by mouth 2 (two) times daily with a meal., Disp: 60 tablet, Rfl: 2   lisinopril (ZESTRIL) 20 MG tablet, Take 20 mg by mouth daily. (Patient not taking: Reported on 09/08/2022), Disp: , Rfl:    methocarbamol (ROBAXIN) 500 MG tablet, Take by mouth at bedtime. (  Patient not taking: Reported on 09/08/2022), Disp: , Rfl:    predniSONE (DELTASONE) 20 MG tablet, Take 1 tablet (20 mg total) by mouth daily with breakfast. (Patient not taking: Reported on 09/08/2022), Disp: 5 tablet, Rfl: 0      Objective:   Vitals:   09/08/22 1104  BP: 120/70  Pulse: 73  SpO2: 99%  Weight: 123 lb 12.8 oz (56.2 kg)  Height: 4\' 10"  (1.473 m)    Estimated body mass index is 25.87 kg/m as calculated from the following:   Height as of this encounter: 4\' 10"  (1.473 m).   Weight as of this encounter: 123 lb 12.8 oz (56.2 kg).  @WEIGHTCHANGE @  [employer]   09/08/22 1104  Weight: 123 lb 12.8 oz (56.2 kg)     Physical Exam   General: No distress. Looks well O2 at rest: no Cane present: no Sitting in wheel chair: no Frail: no Obese: no Neuro: Alert and Oriented x 3. GCS 15. Speech normal Psych: Pleasant Resp:  Barrel Chest - no.  Wheeze - no, Crackles - no, No overt respiratory distress CVS: Normal heart sounds. Murmurs - n Ext: Stigmata of Connective Tissue Disease - no HEENT: Normal upper airway. PEERL +. No post nasal drip        Assessment:       ICD-10-CM   1. Stage 2 moderate COPD by GOLD classification (HCC)   J44.9     2. COPD, frequent exacerbations (HCC)  J44.1     3. Vaccine counseling  Z71.85     4. Screening for malignant neoplasm of respiratory organ  Z12.2          Plan:     Patient Instructions     ICD-10-CM   1. Stage 2 moderate COPD by GOLD classification (HCC)  J44.9     2. COPD, frequent exacerbations (HCC)  J44.1     3. Vaccine counseling  Z71.85     4. Screening for malignant neoplasm of respiratory organ  Z12.2      Stage 2 moderate COPD by GOLD classification (HCC) COPD, frequent exacerbations (HCC)  -glad better after swith of spiriva to trelegy  - lung function improved and now in Moderate CAtegory  Plan  - continue trelegy scheduled - do albuteroal as needed - check alpha 1 AT phenotype  Vaccine counseling  Plan  - high dose flu shot in fall - RSV vaccine anytime now (made similar to flu shot) -  Please talk to PCP Lupita Raider, MD -  and ensure you get  shingrix (GSK) inactivated vaccine against shingles   Screening for malignant neoplasm of respiratory organ  Plan  - await results of LDCT 09/07/22   Folowup  - 6 months or sooner if needed   FOLLOWUP Return in about 6 months (around 03/11/2023) for 15 min visit, copd.    SIGNATURE    Dr. Kalman Shan, M.D., F.C.C.P,  Pulmonary and Critical Care Medicine Staff Physician, [facility] Center Director - Interstitial Lung Disease  Program  Pulmonary Fibrosis [facility] Network at [facility] [place], [place], [address]  Pager: [phone], If no answer or between  15:00h - 7:00h: call [phone] Telephone: [phone]  12:03 PM 09/08/2022

## 2022-09-08 NOTE — Progress Notes (Signed)
Full PFT performed today. °

## 2022-09-15 ENCOUNTER — Other Ambulatory Visit: Payer: Self-pay | Admitting: Acute Care

## 2022-09-15 DIAGNOSIS — Z87891 Personal history of nicotine dependence: Secondary | ICD-10-CM

## 2022-09-15 DIAGNOSIS — Z122 Encounter for screening for malignant neoplasm of respiratory organs: Secondary | ICD-10-CM

## 2022-09-17 LAB — ALPHA-1 ANTITRYPSIN PHENOTYPE: A-1 Antitrypsin, Ser: 121 mg/dL (ref 83–199)

## 2022-10-08 ENCOUNTER — Telehealth: Payer: Self-pay | Admitting: Internal Medicine

## 2022-10-08 NOTE — Telephone Encounter (Signed)
Patient states would like 3 samples of Trelegy. Patient is in the donut hole. Patient phone number is [phone].

## 2022-10-09 NOTE — Telephone Encounter (Signed)
Patient checking on message for Trelegy samples. Patient phone number is [phone].

## 2022-10-12 NOTE — Telephone Encounter (Signed)
Pt calling for 3 samples of the Trelegy she is in a doughnut hole

## 2022-10-13 NOTE — Telephone Encounter (Signed)
Called an spoke pt, at this time we are out of requested samples. She verbalized understanding nfn

## 2022-10-14 DIAGNOSIS — H02831 Dermatochalasis of right upper eyelid: Secondary | ICD-10-CM | POA: Diagnosis not present

## 2022-10-14 DIAGNOSIS — H43811 Vitreous degeneration, right eye: Secondary | ICD-10-CM | POA: Diagnosis not present

## 2022-10-14 DIAGNOSIS — H02834 Dermatochalasis of left upper eyelid: Secondary | ICD-10-CM | POA: Diagnosis not present

## 2022-11-03 DIAGNOSIS — R2231 Localized swelling, mass and lump, right upper limb: Secondary | ICD-10-CM | POA: Diagnosis not present

## 2022-11-03 DIAGNOSIS — L089 Local infection of the skin and subcutaneous tissue, unspecified: Secondary | ICD-10-CM | POA: Diagnosis not present

## 2022-11-03 DIAGNOSIS — S62524A Nondisplaced fracture of distal phalanx of right thumb, initial encounter for closed fracture: Secondary | ICD-10-CM | POA: Diagnosis not present

## 2022-11-05 DIAGNOSIS — L02511 Cutaneous abscess of right hand: Secondary | ICD-10-CM | POA: Diagnosis not present

## 2022-11-05 DIAGNOSIS — L03011 Cellulitis of right finger: Secondary | ICD-10-CM | POA: Diagnosis not present

## 2022-11-13 DIAGNOSIS — I129 Hypertensive chronic kidney disease with stage 1 through stage 4 chronic kidney disease, or unspecified chronic kidney disease: Secondary | ICD-10-CM | POA: Diagnosis not present

## 2022-11-13 DIAGNOSIS — Z0000 Encounter for general adult medical examination without abnormal findings: Secondary | ICD-10-CM | POA: Diagnosis not present

## 2022-11-13 DIAGNOSIS — N183 Chronic kidney disease, stage 3 unspecified: Secondary | ICD-10-CM | POA: Diagnosis not present

## 2022-11-13 DIAGNOSIS — F3342 Major depressive disorder, recurrent, in full remission: Secondary | ICD-10-CM | POA: Diagnosis not present

## 2022-11-13 DIAGNOSIS — E890 Postprocedural hypothyroidism: Secondary | ICD-10-CM | POA: Diagnosis not present

## 2022-11-13 DIAGNOSIS — G4733 Obstructive sleep apnea (adult) (pediatric): Secondary | ICD-10-CM | POA: Diagnosis not present

## 2022-11-13 DIAGNOSIS — Z23 Encounter for immunization: Secondary | ICD-10-CM | POA: Diagnosis not present

## 2022-11-13 DIAGNOSIS — J449 Chronic obstructive pulmonary disease, unspecified: Secondary | ICD-10-CM | POA: Diagnosis not present

## 2022-11-13 DIAGNOSIS — E782 Mixed hyperlipidemia: Secondary | ICD-10-CM | POA: Diagnosis not present

## 2022-11-13 DIAGNOSIS — M810 Age-related osteoporosis without current pathological fracture: Secondary | ICD-10-CM | POA: Diagnosis not present

## 2022-11-13 DIAGNOSIS — L03011 Cellulitis of right finger: Secondary | ICD-10-CM | POA: Diagnosis not present

## 2022-11-13 DIAGNOSIS — R7303 Prediabetes: Secondary | ICD-10-CM | POA: Diagnosis not present

## 2022-11-17 DIAGNOSIS — H43811 Vitreous degeneration, right eye: Secondary | ICD-10-CM | POA: Diagnosis not present

## 2023-01-06 ENCOUNTER — Other Ambulatory Visit: Payer: Self-pay | Admitting: Adult Health

## 2023-01-07 ENCOUNTER — Other Ambulatory Visit (HOSPITAL_BASED_OUTPATIENT_CLINIC_OR_DEPARTMENT_OTHER): Payer: Self-pay

## 2023-01-07 ENCOUNTER — Other Ambulatory Visit: Payer: Self-pay

## 2023-01-07 ENCOUNTER — Emergency Department (HOSPITAL_BASED_OUTPATIENT_CLINIC_OR_DEPARTMENT_OTHER): Payer: PPO | Admitting: Radiology

## 2023-01-07 ENCOUNTER — Emergency Department (HOSPITAL_BASED_OUTPATIENT_CLINIC_OR_DEPARTMENT_OTHER)
Admission: EM | Admit: 2023-01-07 | Discharge: 2023-01-07 | Disposition: A | Discharge status: Home / Self Care | Payer: PPO | Attending: Emergency Medicine | Admitting: Emergency Medicine

## 2023-01-07 DIAGNOSIS — Z1152 Encounter for screening for COVID-19: Secondary | ICD-10-CM | POA: Insufficient documentation

## 2023-01-07 DIAGNOSIS — Z7951 Long term (current) use of inhaled steroids: Secondary | ICD-10-CM | POA: Insufficient documentation

## 2023-01-07 DIAGNOSIS — R490 Dysphonia: Secondary | ICD-10-CM | POA: Diagnosis not present

## 2023-01-07 DIAGNOSIS — R059 Cough, unspecified: Secondary | ICD-10-CM | POA: Diagnosis not present

## 2023-01-07 DIAGNOSIS — R509 Fever, unspecified: Secondary | ICD-10-CM | POA: Diagnosis not present

## 2023-01-07 DIAGNOSIS — J40 Bronchitis, not specified as acute or chronic: Secondary | ICD-10-CM | POA: Diagnosis not present

## 2023-01-07 DIAGNOSIS — J441 Chronic obstructive pulmonary disease with (acute) exacerbation: Secondary | ICD-10-CM | POA: Diagnosis not present

## 2023-01-07 DIAGNOSIS — J449 Chronic obstructive pulmonary disease, unspecified: Secondary | ICD-10-CM | POA: Insufficient documentation

## 2023-01-07 DIAGNOSIS — Z79899 Other long term (current) drug therapy: Secondary | ICD-10-CM | POA: Insufficient documentation

## 2023-01-07 LAB — RESP PANEL BY RT-PCR (RSV, FLU A&B, COVID)  RVPGX2
Influenza A by PCR: NEGATIVE
Influenza B by PCR: NEGATIVE
Resp Syncytial Virus by PCR: NEGATIVE
SARS Coronavirus 2 by RT PCR: NEGATIVE

## 2023-01-07 MED ORDER — PREDNISONE 50 MG PO TABS
50.0000 mg | ORAL_TABLET | Freq: Every day | ORAL | 0 refills | Status: DC
  Filled 2023-01-07: qty 5, 5d supply, fill #0

## 2023-01-07 NOTE — ED Notes (Signed)
Reviewed AVS/discharge instruction with patient. Time allotted for and all questions answered. Patient is agreeable for d/c and escorted to ed exit by staff.  

## 2023-01-07 NOTE — ED Provider Notes (Signed)
[place] EMERGENCY DEPARTMENT AT [facility] Provider Note   CSN: [id number] Arrival date & time: 01/07/23  1022     History  Chief Complaint  Patient presents with   Cough    Kimberly [name] is a 72 y.o. female.  72 year old female presents with 3 days of URI symptoms.  She has noted nonproductive cough, congestion, hoarseness.  Mild headache and fever and chills.  Denies any urinary symptoms.  Denies any dyspnea exertion.  No abdominal or chest discomfort.  Unresponsive to her home medications.       Home Medications Prior to Admission medications   Medication Sig Start Date End Date Taking? Authorizing Provider  acetaminophen (TYLENOL) 500 MG tablet Take 1,000 mg by mouth every 6 (six) hours as needed for moderate pain or headache.     [provider]  albuterol (VENTOLIN HFA) 108 (90 Base) MCG/ACT inhaler Inhale 2 puffs into the lungs every 6 (six) hours as needed for wheezing or shortness of breath. 05/07/22   Parrett, Virgel Bouquet, NP  calcium carbonate (OSCAL) 1500 (600 Ca) MG TABS tablet Take 600 mg of elemental calcium by mouth daily with breakfast.    [provider]  carvedilol (COREG) 6.25 MG tablet Take 1 tablet (6.25 mg total) by mouth 2 (two) times daily with a meal. 01/15/20 04/14/20  Dahal, Melina Schools, MD  Cholecalciferol (VITAMIN D) 1000 UNITS capsule Take 1,000 Units by mouth every morning.    [provider]  Coenzyme Q10 (COQ10) 100 MG CAPS Take 100 mg by mouth daily.    [provider]  denosumab (PROLIA) 60 MG/ML SOLN injection Inject 60 mg into the skin every 6 (six) months. Administer in upper arm, thigh, or abdomen    [provider]  fenofibrate 160 MG tablet Take 160 mg by mouth daily. 03/09/21   [provider]  Fluticasone-Umeclidin-Vilant (TRELEGY ELLIPTA) 100-62.5-25 MCG/ACT AEPB Inhale 1 puff into the lungs daily. 06/16/22   Parrett, Virgel Bouquet, NP  lisinopril (ZESTRIL) 20 MG tablet Take 20 mg by mouth  daily. Patient not taking: Reported on 09/08/2022 02/11/21   [provider]  losartan (COZAAR) 50 MG tablet Take 50 mg by mouth daily. 09/06/22   [provider]  methocarbamol (ROBAXIN) 500 MG tablet Take by mouth at bedtime. Patient not taking: Reported on 09/08/2022 12/04/20   [provider]  omeprazole (PRILOSEC) 40 MG capsule Take 40 mg by mouth every morning. 02/27/21   [provider]  Pitavastatin Magnesium (ZYPITAMAG) 4 MG TABS Take 4 mg by mouth daily.    [provider]  predniSONE (DELTASONE) 20 MG tablet Take 1 tablet (20 mg total) by mouth daily with breakfast. Patient not taking: Reported on 09/08/2022 06/16/22   Parrett, Virgel Bouquet, NP  valACYclovir (VALTREX) 1000 MG tablet Take 1,000 mg by mouth 2 (two) times daily as needed.    [provider]  venlafaxine XR (EFFEXOR-XR) 75 MG 24 hr capsule Take 75 mg by mouth daily. 05/30/18   [provider]      Allergies    Crestor [rosuvastatin], Fosamax [alendronate], Oxycodone, Penicillins, Statins, and Sulfonamide derivatives    Review of Systems   Review of Systems  All other systems reviewed and are negative.   Physical Exam Updated Vital Signs BP (!) 148/83 (BP Location: Right Arm)   Pulse 97   Temp 98.5 F (36.9 C) (Oral)   Resp 19   Ht 1.473 m (4\' 10" )   Wt 56.2 kg  SpO2 96%   BMI 25.89 kg/m  Physical Exam Vitals and nursing note reviewed.  Constitutional:      General: She is not in acute distress.    Appearance: Normal appearance. She is well-developed. She is not toxic-appearing.  HENT:     Head: Normocephalic and atraumatic.  Eyes:     General: Lids are normal.     Conjunctiva/sclera: Conjunctivae normal.     Pupils: Pupils are equal, round, and reactive to light.  Neck:     Thyroid: No thyroid mass.     Trachea: No tracheal deviation.  Cardiovascular:     Rate and Rhythm: Normal rate and regular rhythm.     Heart sounds: Normal heart sounds. No  murmur heard.    No gallop.  Pulmonary:     Effort: Pulmonary effort is normal. No respiratory distress.     Breath sounds: Normal breath sounds. No stridor. No decreased breath sounds, wheezing, rhonchi or rales.  Abdominal:     General: There is no distension.     Palpations: Abdomen is soft.     Tenderness: There is no abdominal tenderness. There is no rebound.  Musculoskeletal:        General: No tenderness. Normal range of motion.     Cervical back: Normal range of motion and neck supple.  Skin:    General: Skin is warm and dry.     Findings: No abrasion or rash.  Neurological:     Mental Status: She is alert and oriented to person, place, and time. Mental status is at baseline.     GCS: GCS eye subscore is 4. GCS verbal subscore is 5. GCS motor subscore is 6.     Cranial Nerves: No cranial nerve deficit.     Sensory: No sensory deficit.     Motor: Motor function is intact.  Psychiatric:        Attention and Perception: Attention normal.        Speech: Speech normal.        Behavior: Behavior normal.     ED Results / Procedures / Treatments   Labs (all labs ordered are listed, but only abnormal results are displayed) Labs Reviewed  RESP PANEL BY RT-PCR (RSV, FLU A&B, COVID)  [id number]    EKG None  Radiology No results found.  Procedures Procedures    Medications Ordered in ED Medications - No data to display  ED Course/ Medical Decision Making/ A&P                                 Medical Decision Making Amount and/or Complexity of Data Reviewed Radiology: ordered.   Patient's viral respiratory panel negative.  Chest x-ray shows bronchitis.  She is not hypoxic.  Low suspicion for ACS or PE.  Suspect likely COPD exacerbation.  Patient has home nebulizer which she will continue.  Will add prednisone and will discharge        Final Clinical Impression(s) / ED Diagnoses Final diagnoses:  None    Rx / DC Orders ED Discharge Orders     None          Lorre Nick, MD 01/07/23 1249

## 2023-01-07 NOTE — ED Notes (Signed)
Swab was done and sent to the lab.

## 2023-01-07 NOTE — ED Triage Notes (Signed)
Pt via pov from home with cough, congestion, hoarseness, headache, fever/chills since Friday. Pt alert & oriented, nad noted.

## 2023-01-09 DIAGNOSIS — H109 Unspecified conjunctivitis: Secondary | ICD-10-CM | POA: Diagnosis not present

## 2023-01-09 DIAGNOSIS — R051 Acute cough: Secondary | ICD-10-CM | POA: Diagnosis not present

## 2023-01-09 DIAGNOSIS — J441 Chronic obstructive pulmonary disease with (acute) exacerbation: Secondary | ICD-10-CM | POA: Diagnosis not present

## 2023-01-11 ENCOUNTER — Telehealth: Payer: Self-pay | Admitting: Internal Medicine

## 2023-01-11 DIAGNOSIS — J449 Chronic obstructive pulmonary disease, unspecified: Secondary | ICD-10-CM

## 2023-01-11 NOTE — Telephone Encounter (Signed)
Patient would like refill for Trelegy. Pharmacy is [facility]. Patient phone number is [phone].

## 2023-01-18 MED ORDER — TRELEGY ELLIPTA 100-62.5-25 MCG/ACT IN AEPB: 1.0000 | INHALATION_SPRAY | Freq: Every day | RESPIRATORY_TRACT | 5 refills | Status: DC

## 2023-01-18 NOTE — Telephone Encounter (Signed)
 [facility] to [facility].

## 2023-02-15 DIAGNOSIS — H26491 Other secondary cataract, right eye: Secondary | ICD-10-CM | POA: Diagnosis not present

## 2023-02-15 DIAGNOSIS — H35342 Macular cyst, hole, or pseudohole, left eye: Secondary | ICD-10-CM | POA: Diagnosis not present

## 2023-03-08 ENCOUNTER — Encounter: Payer: Self-pay | Admitting: Internal Medicine

## 2023-03-08 ENCOUNTER — Ambulatory Visit: Payer: PPO | Admitting: Internal Medicine

## 2023-03-08 VITALS — BP 141/80 | HR 91 | Temp 98.3°F | Ht 59.0 in | Wt 129.0 lb

## 2023-03-08 DIAGNOSIS — J449 Chronic obstructive pulmonary disease, unspecified: Secondary | ICD-10-CM | POA: Diagnosis not present

## 2023-03-08 DIAGNOSIS — Z122 Encounter for screening for malignant neoplasm of respiratory organs: Secondary | ICD-10-CM

## 2023-03-08 NOTE — Patient Instructions (Addendum)
 ICD-10-CM   1. Stage 2 moderate COPD by GOLD classification (HCC)  J44.9     2. Screening for malignant neoplasm of respiratory organ  Z12.2        Stage 2 moderate COPD by GOLD classification (HCC) COPD, frequent exacerbations (HCC)  -Glad doing well on Trelegy although I did note that in December 2024 you are in the ER for COPD exacerbation. -You have picked up early respiratory viral symptoms today  03/08/2023 -but no evidence of COPD flareup  Plan  - continue trelegy scheduled - do albuteroal as needed -If symptoms of COPD flareup get worse then please call us for antibiotic and prednisone -If you end up being frequent COPD flareups we can consider add-on medication for you; just call us    Screening for malignant neoplasm of respiratory organ -negative CT scan August 2024  Plan  - Lung screening program should be arranging a CT scan for you in August 2025  Folowup  - 9 months or sooner if needed

## 2023-03-08 NOTE — Progress Notes (Signed)
 IOV June 2021 with Dr Marchelle Gearing    OV 06/22/2019  Subjective:  Patient ID: Kimberly [name], female , DOB: 1950/07/14 , age 73 y.o. , MRN: [id number] , ADDRESS: [address] [place] [address]   06/22/2019 -   Chief Complaint  Patient presents with   Consult    Pt being referred by Dr. Clelia Croft for evaluation of COPD. Pt has an occ cough with clear phlegm, SOB with activities, and also has occ chest discomfort.     HPI Kimberly [name] 73 y.o. -presents with her husband.  She is an active smoker.  She has not taken the Covid vaccine because of fear of side effects.  She and her husband believe that in late 2019 she had really significant respiratory infection that she believes she might of had Covid at that time.  Nevertheless approximately 4 - 5 years ago primary care physician given a diagnosis of COPD.  She has been on albuterol as needed as needed.  However she says that she never had symptoms back then.  Was diagnosed with made on physical exam according to her history.  Then starting approximately over a year ago she developed insidious onset of chronic cough that has persisted.  She is on lisinopril for several years but she does not believe is the cause of the cough.  The cough itself is mild.  She does bring up some phlegm in her chest.  She also has some chest tightness.  She has relative dyspnea for climbing flights of stairs but the dyspnea itself is mild.  There is also fluctuations day-to-day on the symptoms.  There are days when she feels good there are days she feels bad.  The average COPD CAT score symptomatology burden is listed below.  Then approximately May 12, 2018 when she developed bilateral pleuritic chest pain in the infra axillary area.  This resulted in the ER visit.  I reviewed the ER records.  She had CT scan of the chest that shows small bilateral effusions and biapical emphysema.  Otherwise no evidence of ILD or cancer.  There is BNP and troponin that were  normal.  We do not have echo report.  I personally visualized the CT images.  Since then the pleurisy has resolved but she is worried about the pleural effusion.  She also has persistent shortness of breath and cough as described below.  She is interested in getting to the bottom of her symptoms  Regarding Covid vaccination: She is worried about the side effects of the vaccine.  Therefore she and her husband will not take the vaccine.  06/16/2022 Follow up: COPD  Patient presents for an acute office visit.  Patient complains over the last several weeks that her cough has picked up.  Complains of a dry cough throughout the day.  Says that Spiriva is not helping.  Feels that her breathing is not doing as well.  Patient denies any fever or discolored mucus.  No chest pain orthopnea PND or increased leg swelling.  Patient is on ACE inhibitor.  Has been on this for many years.  She has not been using any cough medicines over-the-counter.  Albuterol is not helping.  She does participate in the lung cancer CT screening program.  CT August 2023 showed no acute or suspicious areas     High-resolution CT chest August 11, 2019 - for interstitial lung disease, mild diffuse bilateral bronchial wall thickening with innumerable tiny centrilobular nodules most numerous  and the lung apices consistent with a smoking-related bronchitis/respiratory bronchiolitis, 3 mm right upper lobe nodule.  Atherosclerosis noted, previous pleural effusions resolved     Autoimmune/connective tissue labs negative except for sed rate elevated at 66 and ANA positive , 1: 40, cytoplasmic, CCP negative     2D echo showed a preserved EF and normal pulmonary artery pressures with grade 1 diastolic dysfunction.   Pulmonary function testing September 13, 2019 moderate obstruction with FEV1 at 64%, ratio 67, FVC 71%, significant bronchodilator response. Trial  Chest xray today  Prednisone 20mg  daily for 5 days.  Stop Spiriva  Begin Trelegy 1  puff daily, rinse after use.  Delsym 2 tsp Twice daily  As needed  cough.  Ventolin inhaler As needed  Wheezing /shortness of breath.  Activity as tolerated.  Continue with Lung cancer screening program- Yearly low dose CT   Discuss with Dr. Clelia Croft that Lisinopril may be aggravating your daily cough .  Follow up with Dr. Marchelle Gearing in 3 months with PFT and As needed    OV 09/08/2022  Subjective:  Patient ID: Kimberly [name], female , DOB: January 27, 1950 , age 23 y.o. , MRN: [id number] , ADDRESS: [address] [place] [address] PCP Lupita Raider, MD Patient Care Team: Lupita Raider, MD as PCP - General (Family Medicine) Alvan Dame, DPM as Consulting Physician (Podiatry)  This Provider for this visit: Treatment Team:  Attending Provider: Kalman Shan, MD    09/08/2022 -   Chief Complaint  Patient presents with   Follow-up    F/up on PFT     HPI Kimberly [name] 73 y.o. -Personally last seen by me in 2021.  last visti with APP in June 2024 had AECOPD and given prednisone. Spiriva changed to trelegy and asked to dc lisinopri. Since then had Screening CT chest - results pending. AT this visit she tells me-since starting Trelegy she feels a lot better.  She has had no further exacerbations.  She feels Trelegy inhaler is helping her a lot.  Hyphema and is also improved.  She had a low-dose CT scan of the chest yesterday but the results are pending.    CT Chest data from date: *09/07/22 - official report pending . IT is a LDCT for lung cance sscreen  - personally visualized and independently interpreted : YES - my findings are: NO CANCER.   OV 03/08/2023  Subjective:  Patient ID: Kimberly [name], female , DOB: 1950/07/18 , age 79 y.o. , MRN: [id number] , ADDRESS: [address] [place] [address] PCP Lupita Raider, MD Patient Care Team: Lupita Raider, MD as PCP - General (Family Medicine) Alvan Dame, DPM as Consulting Physician (Podiatry)  This  Provider for this visit: Treatment Team:  Attending Provider: Kalman Shan, MD    03/08/2023 -   Chief Complaint  Patient presents with   Follow-up     HPI Kimberly [name] 73 y.o. -FU Gold stage 2 copd. LAst seen 6 months ao. Then afte that well but right after XMas ended up in ER with AECOPD. Disscharged with prednisone. Underwent LDCT Aug 2024 and no cancer. NExt LDCT bu lung cancer sdcreeing program in Aug 2025. Feels well. HAs quit smoking. Husband's smoking is in remission too.Lst 1 day has cough and trransiet low grade fever in morning. Husband was sick but no sputum and no wheeze    PFT     Latest Ref Rng & Units 09/08/2022    9:48 AM 09/13/2019  9:42 AM  PFT Results  FVC-Pre L 1.70  1.26   FVC-Predicted Pre % 72  51   FVC-Post L 1.45  1.75   FVC-Predicted Post % 61  71   Pre FEV1/FVC % % 65  72   Post FEV1/FCV % % 70  67   FEV1-Pre L 1.11  0.91   FEV1-Predicted Pre % 63  49   FEV1-Post L 1.01  1.18   DLCO uncorrected ml/min/mmHg 14.98  15.57   DLCO UNC% % 92  95   DLCO corrected ml/min/mmHg 14.98  15.04   DLCO COR %Predicted % 92  91   DLVA Predicted % 106  99   TLC L 4.40  4.23   TLC % Predicted % 102  98   RV % Predicted % 148  142        LAB RESULTS last 96 hours No results found.       has a past medical history of Anxiety, Arthritis, Bronchitis, Cataract, Complication of anesthesia, COPD (chronic obstructive pulmonary disease) (HCC), Depression, Generalized headaches, Heart murmur, Hiatal hernia, History of blood in urine, History of goiter, Hyperlipemia, Hypertension, Osteoporosis, Sigmoid diverticulosis, Subcutaneous mass, Wears glasses, Wears glasses, and Wears partial dentures.   reports that she quit smoking about 4 years ago. Her smoking use included cigarettes. She started smoking about 49 years ago. She has a 45 pack-year smoking history. She has never used smokeless tobacco.  Past Surgical History:  Procedure Laterality Date   BREAST  EXCISIONAL BIOPSY Right pt unsure   fibrocystic tissue   BREAST EXCISIONAL BIOPSY Left pt unsure   scarring   BREAST SURGERY  [phone]   x3 fibrocystic disease, radial scarring   BUNIONECTOMY Right    CATARACT EXTRACTION, BILATERAL     COLONOSCOPY  2011   ESOPHAGOGASTRODUODENOSCOPY  2008   HAMMER TOE SURGERY Right    4,5   HAND SURGERY Left    x 2 digits"repair fron cuts of tendons" "caught in door"   KNEE ARTHROSCOPY Left    torn cartilage   LESION REMOVAL N/A 09/28/2013   Procedure: REMOVAL OF POSTERIOR NECK MASS;  Surgeon: Karie Soda, MD;  Location: [facility];  Service: General;  Laterality: N/A;   LIPOMA EXCISION  [phone]   x4 -1 abd, 1 rt.. arm, 1 bil. thigh   MASS EXCISION N/A 07/26/2018   Procedure: REMOVAL OF RIGHT FLANK , LEFT LATERAL CHEST WALL AND RIGHT THIGH SUBCUTANEOUS MASSES;  Surgeon: Karie Soda, MD;  Location: [facility];  Service: General;  Laterality: N/A;   PARTIAL HYSTERECTOMY  1984   abnormal bleeding   PARTIAL KNEE ARTHROPLASTY Left    THYROID LOBECTOMY Left 1970's   UPPER GASTROINTESTINAL ENDOSCOPY      Allergies  Allergen Reactions   Crestor [Rosuvastatin] Other (See Comments)    Unknown   Fosamax [Alendronate]     Muscles spasms and cramps   Oxycodone Itching   Penicillins Itching, Rash and Other (See Comments)    Did it involve swelling of the face/tongue/throat, SOB, or low BP? No Did it involve sudden or severe rash/hives, skin peeling, or any reaction on the inside of your mouth or nose? Yes Did you need to seek medical attention at a hospital or doctor's office? Yes When did it last happen? Long time ago If all above answers are "NO", may proceed with cephalosporin use.    Statins     Muscle spasms and cramps   Sulfonamide Derivatives Rash    Immunization  History  Administered Date(s) Administered   Influenza Split 11/13/2011, 10/12/2012, 09/25/2019   Influenza, High Dose Seasonal PF 09/13/2018   Influenza,inj,quad,  With Preservative 09/26/2016   Influenza-Unspecified 11/12/2020   Pneumococcal Conjugate-13 03/22/2015   Pneumococcal Polysaccharide-23 01/04/2012, 05/21/2016   Pneumococcal-Unspecified 11/12/2016   Zoster, Live 09/25/2019    Family History  Problem Relation Age of Onset   Heart disease Father    Heart disease Sister        cardiac arrest   Lymphoma Brother    Colon cancer Paternal Grandmother    Esophageal cancer Neg Hx    Rectal cancer Neg Hx    Stomach cancer Neg Hx      Current Outpatient Medications:    acetaminophen (TYLENOL) 500 MG tablet, Take 1,000 mg by mouth every 6 (six) hours as needed for moderate pain or headache. , Disp: , Rfl:    albuterol (VENTOLIN HFA) 108 (90 Base) MCG/ACT inhaler, Inhale 2 puffs into the lungs every 6 (six) hours as needed for wheezing or shortness of breath., Disp: 18 g, Rfl: 3   calcium carbonate (OSCAL) 1500 (600 Ca) MG TABS tablet, Take 600 mg of elemental calcium by mouth daily with breakfast., Disp: , Rfl:    Cholecalciferol (VITAMIN D) 1000 UNITS capsule, Take 1,000 Units by mouth every morning., Disp: , Rfl:    Coenzyme Q10 (COQ10) 100 MG CAPS, Take 100 mg by mouth daily., Disp: , Rfl:    fenofibrate 160 MG tablet, Take 160 mg by mouth daily., Disp: , Rfl:    Fluticasone-Umeclidin-Vilant (TRELEGY ELLIPTA) 100-62.5-25 MCG/ACT AEPB, Inhale 1 puff into the lungs daily. TAKE 1 PUFF BY MOUTH EVERY DAY, Disp: 60 each, Rfl: 5   losartan (COZAAR) 50 MG tablet, Take 50 mg by mouth daily., Disp: , Rfl:    omeprazole (PRILOSEC) 40 MG capsule, Take 40 mg by mouth every morning., Disp: , Rfl:    Pitavastatin Magnesium (ZYPITAMAG) 4 MG TABS, Take 4 mg by mouth daily., Disp: , Rfl:    valACYclovir (VALTREX) 1000 MG tablet, Take 1,000 mg by mouth 2 (two) times daily as needed., Disp: , Rfl:    venlafaxine XR (EFFEXOR-XR) 75 MG 24 hr capsule, Take 75 mg by mouth daily., Disp: , Rfl:    carvedilol (COREG) 6.25 MG tablet, Take 1 tablet (6.25 mg total) by  mouth 2 (two) times daily with a meal., Disp: 60 tablet, Rfl: 2      Objective:   Vitals:   03/08/23 1620  BP: (!) 141/80  Pulse: 91  Temp: 98.3 F (36.8 C)  TempSrc: Oral  SpO2: 94%  Weight: 129 lb (58.5 kg)  Height: 4\' 11"  (1.499 m)    Estimated body mass index is 26.05 kg/m as calculated from the following:   Height as of this encounter: 4\' 11"  (1.499 m).   Weight as of this encounter: 129 lb (58.5 kg).  @WEIGHTCHANGE @  [employer]   03/08/23 1620  Weight: 129 lb (58.5 kg)     Physical Exam   General: No distress. Looks well O2 at rest: no Cane present: no Sitting in wheel chair: no Frail: o Obese: no Neuro: Alert and Oriented x 3. GCS 15. Speech normal Psych: Pleasant Resp:  Barrel Chest - no.  Wheeze - no, Crackles - no, No overt respiratory distress CVS: Normal heart sounds. Murmurs - no Ext: Stigmata of Connective Tissue Disease - no HEENT: Normal upper airway. PEERL +. No post nasal drip        Assessment:  ICD-10-CM   1. Stage 2 moderate COPD by GOLD classification (HCC)  J44.9     2. Screening for malignant neoplasm of respiratory organ  Z12.2          Plan:     Patient Instructions     ICD-10-CM   1. Stage 2 moderate COPD by GOLD classification (HCC)  J44.9     2. Screening for malignant neoplasm of respiratory organ  Z12.2        Stage 2 moderate COPD by GOLD classification (HCC) COPD, frequent exacerbations (HCC)  -Glad doing well on Trelegy although I did note that in December 2024 you are in the ER for COPD exacerbation. -You have picked up early respiratory viral symptoms today  03/08/2023 -but no evidence of COPD flareup  Plan  - continue trelegy scheduled - do albuteroal as needed -If symptoms of COPD flareup get worse then please call us for antibiotic and prednisone -If you end up being frequent COPD flareups we can consider add-on medication for you; just call us    Screening for malignant neoplasm of  respiratory organ -negative CT scan August 2024  Plan  - Lung screening program should be arranging a CT scan for you in August 2025  Folowup  - 9 months or sooner if needed   FOLLOWUP Return in about 9 months (around 12/06/2023) for 15 min visit, copd, Face to Face Visit.    SIGNATURE    Dr. Kalman Shan, M.D., F.C.C.P,  Pulmonary and Critical Care Medicine Staff Physician, [facility] Director - Interstitial Lung Disease  Program  Pulmonary Fibrosis [facility] [place], [place], [address]  Pager: [phone], If no answer or between  15:00h - 7:00h: call [phone] Telephone: [phone]  5:40 PM 03/08/2023

## 2023-03-30 DIAGNOSIS — L509 Urticaria, unspecified: Secondary | ICD-10-CM | POA: Diagnosis not present

## 2023-04-29 ENCOUNTER — Ambulatory Visit
Admission: RE | Admit: 2023-04-29 | Discharge: 2023-04-29 | Disposition: A | Discharge status: Home / Self Care | Payer: PPO | Source: Ambulatory Visit | Attending: Family Medicine | Admitting: Family Medicine

## 2023-04-29 DIAGNOSIS — M810 Age-related osteoporosis without current pathological fracture: Secondary | ICD-10-CM | POA: Diagnosis not present

## 2023-05-14 DIAGNOSIS — E782 Mixed hyperlipidemia: Secondary | ICD-10-CM | POA: Diagnosis not present

## 2023-05-14 DIAGNOSIS — R252 Cramp and spasm: Secondary | ICD-10-CM | POA: Diagnosis not present

## 2023-05-14 DIAGNOSIS — N183 Chronic kidney disease, stage 3 unspecified: Secondary | ICD-10-CM | POA: Diagnosis not present

## 2023-05-14 DIAGNOSIS — I129 Hypertensive chronic kidney disease with stage 1 through stage 4 chronic kidney disease, or unspecified chronic kidney disease: Secondary | ICD-10-CM | POA: Diagnosis not present

## 2023-05-14 DIAGNOSIS — R7303 Prediabetes: Secondary | ICD-10-CM | POA: Diagnosis not present

## 2023-05-18 DIAGNOSIS — Z961 Presence of intraocular lens: Secondary | ICD-10-CM | POA: Diagnosis not present

## 2023-05-18 DIAGNOSIS — H26491 Other secondary cataract, right eye: Secondary | ICD-10-CM | POA: Diagnosis not present

## 2023-05-18 DIAGNOSIS — H02831 Dermatochalasis of right upper eyelid: Secondary | ICD-10-CM | POA: Diagnosis not present

## 2023-05-18 DIAGNOSIS — H18413 Arcus senilis, bilateral: Secondary | ICD-10-CM | POA: Diagnosis not present

## 2023-05-26 DIAGNOSIS — H26491 Other secondary cataract, right eye: Secondary | ICD-10-CM | POA: Diagnosis not present

## 2023-05-28 DIAGNOSIS — D3613 Benign neoplasm of peripheral nerves and autonomic nervous system of lower limb, including hip: Secondary | ICD-10-CM | POA: Diagnosis not present

## 2023-05-28 DIAGNOSIS — D1801 Hemangioma of skin and subcutaneous tissue: Secondary | ICD-10-CM | POA: Diagnosis not present

## 2023-05-28 DIAGNOSIS — D485 Neoplasm of uncertain behavior of skin: Secondary | ICD-10-CM | POA: Diagnosis not present

## 2023-05-28 DIAGNOSIS — D2361 Other benign neoplasm of skin of right upper limb, including shoulder: Secondary | ICD-10-CM | POA: Diagnosis not present

## 2023-05-28 DIAGNOSIS — L821 Other seborrheic keratosis: Secondary | ICD-10-CM | POA: Diagnosis not present

## 2023-06-16 DIAGNOSIS — D2361 Other benign neoplasm of skin of right upper limb, including shoulder: Secondary | ICD-10-CM | POA: Diagnosis not present

## 2023-06-16 DIAGNOSIS — L905 Scar conditions and fibrosis of skin: Secondary | ICD-10-CM | POA: Diagnosis not present

## 2023-06-17 DIAGNOSIS — I129 Hypertensive chronic kidney disease with stage 1 through stage 4 chronic kidney disease, or unspecified chronic kidney disease: Secondary | ICD-10-CM | POA: Diagnosis not present

## 2023-06-17 DIAGNOSIS — N183 Chronic kidney disease, stage 3 unspecified: Secondary | ICD-10-CM | POA: Diagnosis not present

## 2023-06-17 DIAGNOSIS — J449 Chronic obstructive pulmonary disease, unspecified: Secondary | ICD-10-CM | POA: Diagnosis not present

## 2023-06-17 DIAGNOSIS — J439 Emphysema, unspecified: Secondary | ICD-10-CM | POA: Diagnosis not present

## 2023-07-12 DIAGNOSIS — E890 Postprocedural hypothyroidism: Secondary | ICD-10-CM | POA: Diagnosis not present

## 2023-07-12 DIAGNOSIS — I129 Hypertensive chronic kidney disease with stage 1 through stage 4 chronic kidney disease, or unspecified chronic kidney disease: Secondary | ICD-10-CM | POA: Diagnosis not present

## 2023-07-12 DIAGNOSIS — M810 Age-related osteoporosis without current pathological fracture: Secondary | ICD-10-CM | POA: Diagnosis not present

## 2023-07-12 DIAGNOSIS — J449 Chronic obstructive pulmonary disease, unspecified: Secondary | ICD-10-CM | POA: Diagnosis not present

## 2023-07-12 DIAGNOSIS — J439 Emphysema, unspecified: Secondary | ICD-10-CM | POA: Diagnosis not present

## 2023-07-12 DIAGNOSIS — N183 Chronic kidney disease, stage 3 unspecified: Secondary | ICD-10-CM | POA: Diagnosis not present

## 2023-07-12 DIAGNOSIS — E782 Mixed hyperlipidemia: Secondary | ICD-10-CM | POA: Diagnosis not present

## 2023-07-12 DIAGNOSIS — F3342 Major depressive disorder, recurrent, in full remission: Secondary | ICD-10-CM | POA: Diagnosis not present

## 2023-07-16 DIAGNOSIS — N183 Chronic kidney disease, stage 3 unspecified: Secondary | ICD-10-CM | POA: Diagnosis not present

## 2023-07-16 DIAGNOSIS — J449 Chronic obstructive pulmonary disease, unspecified: Secondary | ICD-10-CM | POA: Diagnosis not present

## 2023-07-16 DIAGNOSIS — I129 Hypertensive chronic kidney disease with stage 1 through stage 4 chronic kidney disease, or unspecified chronic kidney disease: Secondary | ICD-10-CM | POA: Diagnosis not present

## 2023-07-16 DIAGNOSIS — J439 Emphysema, unspecified: Secondary | ICD-10-CM | POA: Diagnosis not present

## 2023-08-12 DIAGNOSIS — M810 Age-related osteoporosis without current pathological fracture: Secondary | ICD-10-CM | POA: Diagnosis not present

## 2023-08-12 DIAGNOSIS — J439 Emphysema, unspecified: Secondary | ICD-10-CM | POA: Diagnosis not present

## 2023-08-12 DIAGNOSIS — F3342 Major depressive disorder, recurrent, in full remission: Secondary | ICD-10-CM | POA: Diagnosis not present

## 2023-08-12 DIAGNOSIS — I129 Hypertensive chronic kidney disease with stage 1 through stage 4 chronic kidney disease, or unspecified chronic kidney disease: Secondary | ICD-10-CM | POA: Diagnosis not present

## 2023-08-12 DIAGNOSIS — E890 Postprocedural hypothyroidism: Secondary | ICD-10-CM | POA: Diagnosis not present

## 2023-08-12 DIAGNOSIS — N183 Chronic kidney disease, stage 3 unspecified: Secondary | ICD-10-CM | POA: Diagnosis not present

## 2023-08-12 DIAGNOSIS — E782 Mixed hyperlipidemia: Secondary | ICD-10-CM | POA: Diagnosis not present

## 2023-08-12 DIAGNOSIS — J449 Chronic obstructive pulmonary disease, unspecified: Secondary | ICD-10-CM | POA: Diagnosis not present

## 2023-08-15 DIAGNOSIS — N183 Chronic kidney disease, stage 3 unspecified: Secondary | ICD-10-CM | POA: Diagnosis not present

## 2023-08-15 DIAGNOSIS — J449 Chronic obstructive pulmonary disease, unspecified: Secondary | ICD-10-CM | POA: Diagnosis not present

## 2023-08-15 DIAGNOSIS — J439 Emphysema, unspecified: Secondary | ICD-10-CM | POA: Diagnosis not present

## 2023-08-15 DIAGNOSIS — I129 Hypertensive chronic kidney disease with stage 1 through stage 4 chronic kidney disease, or unspecified chronic kidney disease: Secondary | ICD-10-CM | POA: Diagnosis not present

## 2023-09-08 ENCOUNTER — Ambulatory Visit (HOSPITAL_BASED_OUTPATIENT_CLINIC_OR_DEPARTMENT_OTHER)
Admission: RE | Admit: 2023-09-08 | Discharge: 2023-09-08 | Disposition: A | Discharge status: Home / Self Care | Source: Ambulatory Visit | Attending: Family Medicine | Admitting: Family Medicine

## 2023-09-08 DIAGNOSIS — Z122 Encounter for screening for malignant neoplasm of respiratory organs: Secondary | ICD-10-CM | POA: Diagnosis not present

## 2023-09-08 DIAGNOSIS — Z87891 Personal history of nicotine dependence: Secondary | ICD-10-CM | POA: Diagnosis not present

## 2023-09-12 DIAGNOSIS — J439 Emphysema, unspecified: Secondary | ICD-10-CM | POA: Diagnosis not present

## 2023-09-12 DIAGNOSIS — M810 Age-related osteoporosis without current pathological fracture: Secondary | ICD-10-CM | POA: Diagnosis not present

## 2023-09-12 DIAGNOSIS — J449 Chronic obstructive pulmonary disease, unspecified: Secondary | ICD-10-CM | POA: Diagnosis not present

## 2023-09-12 DIAGNOSIS — I129 Hypertensive chronic kidney disease with stage 1 through stage 4 chronic kidney disease, or unspecified chronic kidney disease: Secondary | ICD-10-CM | POA: Diagnosis not present

## 2023-09-12 DIAGNOSIS — E890 Postprocedural hypothyroidism: Secondary | ICD-10-CM | POA: Diagnosis not present

## 2023-09-12 DIAGNOSIS — F3342 Major depressive disorder, recurrent, in full remission: Secondary | ICD-10-CM | POA: Diagnosis not present

## 2023-09-12 DIAGNOSIS — N183 Chronic kidney disease, stage 3 unspecified: Secondary | ICD-10-CM | POA: Diagnosis not present

## 2023-09-12 DIAGNOSIS — E782 Mixed hyperlipidemia: Secondary | ICD-10-CM | POA: Diagnosis not present

## 2023-09-14 DIAGNOSIS — J439 Emphysema, unspecified: Secondary | ICD-10-CM | POA: Diagnosis not present

## 2023-09-14 DIAGNOSIS — J449 Chronic obstructive pulmonary disease, unspecified: Secondary | ICD-10-CM | POA: Diagnosis not present

## 2023-09-14 DIAGNOSIS — I129 Hypertensive chronic kidney disease with stage 1 through stage 4 chronic kidney disease, or unspecified chronic kidney disease: Secondary | ICD-10-CM | POA: Diagnosis not present

## 2023-09-14 DIAGNOSIS — N183 Chronic kidney disease, stage 3 unspecified: Secondary | ICD-10-CM | POA: Diagnosis not present

## 2023-09-20 ENCOUNTER — Other Ambulatory Visit: Payer: Self-pay

## 2023-09-20 DIAGNOSIS — Z87891 Personal history of nicotine dependence: Secondary | ICD-10-CM

## 2023-09-20 DIAGNOSIS — Z122 Encounter for screening for malignant neoplasm of respiratory organs: Secondary | ICD-10-CM

## 2023-10-01 DIAGNOSIS — M542 Cervicalgia: Secondary | ICD-10-CM | POA: Diagnosis not present

## 2023-10-12 DIAGNOSIS — M810 Age-related osteoporosis without current pathological fracture: Secondary | ICD-10-CM | POA: Diagnosis not present

## 2023-10-12 DIAGNOSIS — J449 Chronic obstructive pulmonary disease, unspecified: Secondary | ICD-10-CM | POA: Diagnosis not present

## 2023-10-12 DIAGNOSIS — I129 Hypertensive chronic kidney disease with stage 1 through stage 4 chronic kidney disease, or unspecified chronic kidney disease: Secondary | ICD-10-CM | POA: Diagnosis not present

## 2023-10-12 DIAGNOSIS — F3342 Major depressive disorder, recurrent, in full remission: Secondary | ICD-10-CM | POA: Diagnosis not present

## 2023-10-12 DIAGNOSIS — E890 Postprocedural hypothyroidism: Secondary | ICD-10-CM | POA: Diagnosis not present

## 2023-10-12 DIAGNOSIS — J439 Emphysema, unspecified: Secondary | ICD-10-CM | POA: Diagnosis not present

## 2023-10-12 DIAGNOSIS — E782 Mixed hyperlipidemia: Secondary | ICD-10-CM | POA: Diagnosis not present

## 2023-10-12 DIAGNOSIS — N183 Chronic kidney disease, stage 3 unspecified: Secondary | ICD-10-CM | POA: Diagnosis not present

## 2023-10-14 DIAGNOSIS — N183 Chronic kidney disease, stage 3 unspecified: Secondary | ICD-10-CM | POA: Diagnosis not present

## 2023-10-14 DIAGNOSIS — J439 Emphysema, unspecified: Secondary | ICD-10-CM | POA: Diagnosis not present

## 2023-10-14 DIAGNOSIS — J449 Chronic obstructive pulmonary disease, unspecified: Secondary | ICD-10-CM | POA: Diagnosis not present

## 2023-10-14 DIAGNOSIS — M542 Cervicalgia: Secondary | ICD-10-CM | POA: Diagnosis not present

## 2023-10-14 DIAGNOSIS — I129 Hypertensive chronic kidney disease with stage 1 through stage 4 chronic kidney disease, or unspecified chronic kidney disease: Secondary | ICD-10-CM | POA: Diagnosis not present

## 2023-10-18 DIAGNOSIS — Z23 Encounter for immunization: Secondary | ICD-10-CM | POA: Diagnosis not present

## 2023-10-19 DIAGNOSIS — M542 Cervicalgia: Secondary | ICD-10-CM | POA: Diagnosis not present

## 2023-10-29 DIAGNOSIS — M542 Cervicalgia: Secondary | ICD-10-CM | POA: Diagnosis not present

## 2023-11-02 DIAGNOSIS — M542 Cervicalgia: Secondary | ICD-10-CM | POA: Diagnosis not present

## 2023-11-08 DIAGNOSIS — J439 Emphysema, unspecified: Secondary | ICD-10-CM | POA: Diagnosis not present

## 2023-11-08 DIAGNOSIS — J069 Acute upper respiratory infection, unspecified: Secondary | ICD-10-CM | POA: Diagnosis not present

## 2023-11-08 DIAGNOSIS — R509 Fever, unspecified: Secondary | ICD-10-CM | POA: Diagnosis not present

## 2023-11-12 ENCOUNTER — Other Ambulatory Visit: Payer: Self-pay | Admitting: Family Medicine

## 2023-11-12 DIAGNOSIS — F3342 Major depressive disorder, recurrent, in full remission: Secondary | ICD-10-CM | POA: Diagnosis not present

## 2023-11-12 DIAGNOSIS — N183 Chronic kidney disease, stage 3 unspecified: Secondary | ICD-10-CM | POA: Diagnosis not present

## 2023-11-12 DIAGNOSIS — J439 Emphysema, unspecified: Secondary | ICD-10-CM | POA: Diagnosis not present

## 2023-11-12 DIAGNOSIS — E890 Postprocedural hypothyroidism: Secondary | ICD-10-CM | POA: Diagnosis not present

## 2023-11-12 DIAGNOSIS — Z1231 Encounter for screening mammogram for malignant neoplasm of breast: Secondary | ICD-10-CM

## 2023-11-12 DIAGNOSIS — E782 Mixed hyperlipidemia: Secondary | ICD-10-CM | POA: Diagnosis not present

## 2023-11-12 DIAGNOSIS — M810 Age-related osteoporosis without current pathological fracture: Secondary | ICD-10-CM | POA: Diagnosis not present

## 2023-11-12 DIAGNOSIS — J449 Chronic obstructive pulmonary disease, unspecified: Secondary | ICD-10-CM | POA: Diagnosis not present

## 2023-11-12 DIAGNOSIS — I129 Hypertensive chronic kidney disease with stage 1 through stage 4 chronic kidney disease, or unspecified chronic kidney disease: Secondary | ICD-10-CM | POA: Diagnosis not present

## 2023-11-13 DIAGNOSIS — J449 Chronic obstructive pulmonary disease, unspecified: Secondary | ICD-10-CM | POA: Diagnosis not present

## 2023-11-13 DIAGNOSIS — N183 Chronic kidney disease, stage 3 unspecified: Secondary | ICD-10-CM | POA: Diagnosis not present

## 2023-11-13 DIAGNOSIS — I129 Hypertensive chronic kidney disease with stage 1 through stage 4 chronic kidney disease, or unspecified chronic kidney disease: Secondary | ICD-10-CM | POA: Diagnosis not present

## 2023-11-13 DIAGNOSIS — J439 Emphysema, unspecified: Secondary | ICD-10-CM | POA: Diagnosis not present

## 2023-11-17 DIAGNOSIS — Z0000 Encounter for general adult medical examination without abnormal findings: Secondary | ICD-10-CM | POA: Diagnosis not present

## 2023-11-17 DIAGNOSIS — N183 Chronic kidney disease, stage 3 unspecified: Secondary | ICD-10-CM | POA: Diagnosis not present

## 2023-11-17 DIAGNOSIS — E1122 Type 2 diabetes mellitus with diabetic chronic kidney disease: Secondary | ICD-10-CM | POA: Diagnosis not present

## 2023-11-17 DIAGNOSIS — J449 Chronic obstructive pulmonary disease, unspecified: Secondary | ICD-10-CM | POA: Diagnosis not present

## 2023-11-17 DIAGNOSIS — E890 Postprocedural hypothyroidism: Secondary | ICD-10-CM | POA: Diagnosis not present

## 2023-11-17 DIAGNOSIS — M810 Age-related osteoporosis without current pathological fracture: Secondary | ICD-10-CM | POA: Diagnosis not present

## 2023-11-17 DIAGNOSIS — G4733 Obstructive sleep apnea (adult) (pediatric): Secondary | ICD-10-CM | POA: Diagnosis not present

## 2023-11-17 DIAGNOSIS — F3342 Major depressive disorder, recurrent, in full remission: Secondary | ICD-10-CM | POA: Diagnosis not present

## 2023-11-17 DIAGNOSIS — E782 Mixed hyperlipidemia: Secondary | ICD-10-CM | POA: Diagnosis not present

## 2023-11-17 DIAGNOSIS — Z1331 Encounter for screening for depression: Secondary | ICD-10-CM | POA: Diagnosis not present

## 2023-11-17 DIAGNOSIS — I129 Hypertensive chronic kidney disease with stage 1 through stage 4 chronic kidney disease, or unspecified chronic kidney disease: Secondary | ICD-10-CM | POA: Diagnosis not present

## 2023-11-18 ENCOUNTER — Encounter: Payer: Self-pay | Admitting: Internal Medicine

## 2023-11-18 ENCOUNTER — Ambulatory Visit: Admitting: Internal Medicine

## 2023-11-18 VITALS — BP 116/64 | HR 77 | Temp 98.5°F | Ht 59.5 in | Wt 127.6 lb

## 2023-11-18 DIAGNOSIS — Z122 Encounter for screening for malignant neoplasm of respiratory organs: Secondary | ICD-10-CM

## 2023-11-18 DIAGNOSIS — J449 Chronic obstructive pulmonary disease, unspecified: Secondary | ICD-10-CM

## 2023-11-18 NOTE — Patient Instructions (Addendum)
 ICD-10-CM   1. Stage 2 moderate COPD by GOLD classification (HCC)  J44.9     2. Screening for malignant neoplasm of respiratory organ  Z12.2        Stage 2 moderate COPD by GOLD classification (HCC) COPD, frequent exacerbations (HCC)  -Glad doing well on Trelegy although I did note that in December 2024 you are in the ER for COPD exacerbation. Since then no flare ups and doing really well  Plan  - continue trelegy scheduled - do albuteroal as needed -If symptoms of COPD flareup get worse then please call us  for antibiotic and prednisone  - Avoid respiratory illness sick exposure and control your risk for respiratory infection  - be uptodate with all respiratory vaccines  - avoid sick contacts especially in areas of indoor clusters (churches, weddings, funerals, family gatherings, birthdays, planes, malls, indoor areas especially)   - mask in these areas   - discourage sick people from coming in close contact with you     Screening for malignant neoplasm of respiratory organ -negative CT scan August 2024 and Sept 2025  Plan  - Lung screening program should be arranging a CT scan for you in Sept 2026  Folowup  Sept 2026 to see APP or DR RAmaswamt but after LDCT

## 2023-11-18 NOTE — Progress Notes (Signed)
 IOV June 2021 with Dr Geronimo    OV 06/22/2019  Subjective:  Patient ID: Kimberly [name], female , DOB: 06/04/1950 , age 73 y.o. , MRN: [id number] , ADDRESS: [address] [place] [address]   06/22/2019 -   Chief Complaint  Patient presents with   Consult    Pt being referred by Dr. Loreli for evaluation of COPD. Pt has an occ cough with clear phlegm, SOB with activities, and also has occ chest discomfort.     HPI Kimberly [name] 73 y.o. -presents with her husband.  She is an active smoker.  She has not taken the Covid vaccine because of fear of side effects.  She and her husband believe that in late 2019 she had really significant respiratory infection that she believes she might of had Covid at that time.  Nevertheless approximately 4 - 5 years ago primary care physician given a diagnosis of COPD.  She has been on albuterol  as needed as needed.  However she says that she never had symptoms back then.  Was diagnosed with made on physical exam according to her history.  Then starting approximately over a year ago she developed insidious onset of chronic cough that has persisted.  She is on lisinopril  for several years but she does not believe is the cause of the cough.  The cough itself is mild.  She does bring up some phlegm in her chest.  She also has some chest tightness.  She has relative dyspnea for climbing flights of stairs but the dyspnea itself is mild.  There is also fluctuations day-to-day on the symptoms.  There are days when she feels good there are days she feels bad.  The average COPD CAT score symptomatology burden is listed below.  Then approximately May 12, 2018 when she developed bilateral pleuritic chest pain in the infra axillary area.  This resulted in the ER visit.  I reviewed the ER records.  She had CT scan of the chest that shows small bilateral effusions and biapical emphysema.  Otherwise no evidence of ILD or cancer.  There is BNP and troponin that were  normal.  We do not have echo report.  I personally visualized the CT images.  Since then the pleurisy has resolved but she is worried about the pleural effusion.  She also has persistent shortness of breath and cough as described below.  She is interested in getting to the bottom of her symptoms  Regarding Covid vaccination: She is worried about the side effects of the vaccine.  Therefore she and her husband will not take the vaccine.  06/16/2022 Follow up: COPD  Patient presents for an acute office visit.  Patient complains over the last several weeks that her cough has picked up.  Complains of a dry cough throughout the day.  Says that Spiriva  is not helping.  Feels that her breathing is not doing as well.  Patient denies any fever or discolored mucus.  No chest pain orthopnea PND or increased leg swelling.  Patient is on ACE inhibitor.  Has been on this for many years.  She has not been using any cough medicines over-the-counter.  Albuterol  is not helping.  She does participate in the lung cancer CT screening program.  CT August 2023 showed no acute or suspicious areas     High-resolution CT chest August 11, 2019 - for interstitial lung disease, mild diffuse bilateral bronchial wall thickening with innumerable tiny centrilobular nodules most numerous  and the lung apices consistent with a smoking-related bronchitis/respiratory bronchiolitis, 3 mm right upper lobe nodule.  Atherosclerosis noted, previous pleural effusions resolved     Autoimmune/connective tissue labs negative except for sed rate elevated at 66 and ANA positive , 1: 40, cytoplasmic, CCP negative     2D echo showed a preserved EF and normal pulmonary artery pressures with grade 1 diastolic dysfunction.   Pulmonary function testing September 13, 2019 moderate obstruction with FEV1 at 64%, ratio 67, FVC 71%, significant bronchodilator response. Trial  Chest xray today  Prednisone  20mg  daily for 5 days.  Stop Spiriva   Begin Trelegy 1  puff daily, rinse after use.  Delsym 2 tsp Twice daily  As needed  cough.  Ventolin  inhaler As needed  Wheezing /shortness of breath.  Activity as tolerated.  Continue with Lung cancer screening program- Yearly low dose CT   Discuss with Dr. Loreli that Lisinopril  may be aggravating your daily cough .  Follow up with Dr. Geronimo in 3 months with PFT and As needed    OV 09/08/2022  Subjective:  Patient ID: Kimberly [name], female , DOB: September 25, 1950 , age 60 y.o. , MRN: [id number] , ADDRESS: [address] [place] [address] PCP Loreli Kins, MD Patient Care Team: Loreli Kins, MD as PCP - General (Family Medicine) Joya Ade, DPM as Consulting Physician (Podiatry)  This Provider for this visit: Treatment Team:  Attending Provider: Geronimo Amel, MD    09/08/2022 -   Chief Complaint  Patient presents with   Follow-up    F/up on PFT     HPI Kimberly [name] 73 y.o. -Personally last seen by me in 2021.  last visti with APP in June 2024 had AECOPD and given prednisone . Spiriva  changed to trelegy and asked to dc lisinopri. Since then had Screening CT chest - results pending. AT this visit she tells me-since starting Trelegy she feels a lot better.  She has had no further exacerbations.  She feels Trelegy inhaler is helping her a lot.  Hyphema and is also improved.  She had a low-dose CT scan of the chest yesterday but the results are pending.    CT Chest data from date: *09/07/22 - official report pending . IT is a LDCT for lung cance sscreen  - personally visualized and independently interpreted : YES - my findings are: NO CANCER.   OV 03/08/2023  Subjective:  Patient ID: Kimberly [name], female , DOB: Jun 25, 1950 , age 20 y.o. , MRN: [id number] , ADDRESS: [address] [place] [address] PCP Loreli Kins, MD Patient Care Team: Loreli Kins, MD as PCP - General (Family Medicine) Joya Ade, DPM as Consulting Physician (Podiatry)  This  Provider for this visit: Treatment Team:  Attending Provider: Geronimo Amel, MD    03/08/2023 -   Chief Complaint  Patient presents with   Follow-up     HPI Kimberly [name] 73 y.o. -FU Gold stage 2 copd. LAst seen 6 months ao. Then afte that well but right after XMas ended up in ER with AECOPD. Disscharged with prednisone . Underwent LDCT Aug 2024 and no cancer. NExt LDCT bu lung cancer sdcreeing program in Aug 2025. Feels well. HAs quit smoking. Husband's smoking is in remission too.Lst 1 day has cough and trransiet low grade fever in morning. Husband was sick but no sputum and no wheeze      OV 11/18/2023  Subjective:  Patient ID: Kimberly [name], female , DOB: Jul 21, 1950 , age 41  y.o. , MRN: [id number] , ADDRESS: [address] [place] [address] PCP Loreli Kins, MD Patient Care Team: Loreli Kins, MD as PCP - General (Family Medicine) Joya Ade, DPM as Consulting Physician (Podiatry)  This Provider for this visit: Treatment Team:  Attending Provider: Geronimo Amel, MD    11/18/2023 -   Chief Complaint  Patient presents with   COPD    Pt states since LOV breathing has been fine      HPI Kimberly [name] 73 y.o. -LAJUNE PERINE is a 73 year old female who presents for a routine follow-up visit. COPD and lung cancer screen  She has experienced no new problems since her last visit over six months ago, with no hospitalizations, emergency room visits, urgent care visits, or surgeries during this period.  She received her flu shot and reports no changes in her symptoms. At her recent annual check-up, her primary care doctor told her that her lungs and heart sounded good.  A CT scan performed a couple of months ago was clear with no signs of cancer.  Her current medications include Prilosec, Coreg , and Trelegy, which she is taking as prescribed. No new symptoms or changes in her condition    CT Chest data from date: sept 2025  - personally  visualized and independently interpreted : no - my findings are: below arrative & Impression  CLINICAL DATA:  Former 59 pack-year smoker, quit 2023.   EXAM: CT CHEST WITHOUT CONTRAST LOW-DOSE FOR LUNG CANCER SCREENING   TECHNIQUE: Multidetector CT imaging of the chest was performed following the standard protocol without IV contrast.   RADIATION DOSE REDUCTION: This exam was performed according to the departmental dose-optimization program which includes automated exposure control, adjustment of the mA and/or kV according to patient size and/or use of iterative reconstruction technique.   COMPARISON:  09/07/2022.   FINDINGS: Cardiovascular: Atherosclerotic calcification of the aorta, aortic valve and coronary arteries. Heart size normal. No pericardial effusion.   Mediastinum/Nodes: No pathologically enlarged mediastinal or axillary lymph nodes. Hilar regions are difficult to definitively evaluate without IV contrast. Esophagus is grossly unremarkable.   Lungs/Pleura: Biapical pleuroparenchymal scarring. Centrilobular and paraseptal emphysema. Residual mild smoking related respiratory bronchiolitis. Pulmonary nodules measure 5.0 mm or less in size. No suspicious pulmonary nodules. No pleural fluid. Airway is unremarkable.   Upper Abdomen: Visualized portions of the liver, gallbladder, adrenal glands, kidneys, spleen, pancreas, stomach and bowel are grossly unremarkable. No upper abdominal adenopathy.   Musculoskeletal: Minimal degenerative change in the spine. Osteopenia.   IMPRESSION: 1. Lung-RADS 2, benign appearance or behavior. Continue annual screening with low-dose chest CT without contrast in 12 months. 2. Aortic atherosclerosis (ICD10-I70.0). Coronary artery calcification. 3.  Emphysema (ICD10-J43.9).     Electronically Signed   By: Newell Eke M.D.   On: 09/20/2023 15:19      PFT     Latest Ref Rng & Units 09/08/2022    9:48 AM 09/13/2019    9:42  AM  PFT Results  FVC-Pre L 1.70  1.26   FVC-Predicted Pre % 72  51   FVC-Post L 1.45  1.75   FVC-Predicted Post % 61  71   Pre FEV1/FVC % % 65  72   Post FEV1/FCV % % 70  67   FEV1-Pre L 1.11  0.91   FEV1-Predicted Pre % 63  49   FEV1-Post L 1.01  1.18   DLCO uncorrected ml/min/mmHg 14.98  15.57   DLCO UNC% % 92  95  DLCO corrected ml/min/mmHg 14.98  15.04   DLCO COR %Predicted % 92  91   DLVA Predicted % 106  99   TLC L 4.40  4.23   TLC % Predicted % 102  98   RV % Predicted % 148  142        LAB RESULTS last 96 hours No results found.       has a past medical history of Anxiety, Arthritis, Bronchitis, Cataract, Complication of anesthesia, COPD (chronic obstructive pulmonary disease) (HCC), Depression, Generalized headaches, Heart murmur, Hiatal hernia, History of blood in urine, History of goiter, Hyperlipemia, Hypertension, Osteoporosis, Sigmoid diverticulosis, Subcutaneous mass, Wears glasses, Wears glasses, and Wears partial dentures.   reports that she quit smoking about 4 years ago. Her smoking use included cigarettes. She started smoking about 49 years ago. She has a 45 pack-year smoking history. She has never used smokeless tobacco.  Past Surgical History:  Procedure Laterality Date   BREAST EXCISIONAL BIOPSY Right pt unsure   fibrocystic tissue   BREAST EXCISIONAL BIOPSY Left pt unsure   scarring   BREAST SURGERY  [id number]   x3 fibrocystic disease, radial scarring   BUNIONECTOMY Right    CATARACT EXTRACTION, BILATERAL     COLONOSCOPY  2011   ESOPHAGOGASTRODUODENOSCOPY  2008   HAMMER TOE SURGERY Right    4,5   HAND SURGERY Left    x 2 digitsrepair fron cuts of tendons caught in door   KNEE ARTHROSCOPY Left    torn cartilage   LESION REMOVAL N/A 09/28/2013   Procedure: REMOVAL OF POSTERIOR NECK MASS;  Surgeon: Elspeth Schultze, MD;  Location: [facility];  Service: General;  Laterality: N/A;   LIPOMA EXCISION  [phone]   x4 -1 abd, 1 rt.. arm, 1 bil. thigh    MASS EXCISION N/A 07/26/2018   Procedure: REMOVAL OF RIGHT FLANK , LEFT LATERAL CHEST WALL AND RIGHT THIGH SUBCUTANEOUS MASSES;  Surgeon: Schultze Elspeth, MD;  Location: [facility];  Service: General;  Laterality: N/A;   PARTIAL HYSTERECTOMY  1984   abnormal bleeding   PARTIAL KNEE ARTHROPLASTY Left    THYROID  LOBECTOMY Left 1970's   UPPER GASTROINTESTINAL ENDOSCOPY      Allergies  Allergen Reactions   Crestor [Rosuvastatin] Other (See Comments)    Unknown   Fosamax [Alendronate]     Muscles spasms and cramps   Oxycodone Itching   Penicillins Itching, Rash and Other (See Comments)    Did it involve swelling of the face/tongue/throat, SOB, or low BP? No Did it involve sudden or severe rash/hives, skin peeling, or any reaction on the inside of your mouth or nose? Yes Did you need to seek medical attention at a hospital or doctor's office? Yes When did it last happen? Long time ago If all above answers are "NO", may proceed with cephalosporin use.    Statins     Muscle spasms and cramps   Sulfonamide Derivatives Rash    Immunization History  Administered Date(s) Administered   Fluzone Influenza virus vaccine,trivalent (IIV3), split virus 10/12/2012, 09/25/2019   INFLUENZA, HIGH DOSE SEASONAL PF 09/13/2018   Influenza Split 11/13/2011   Influenza,inj,quad, With Preservative 09/26/2016   Influenza-Unspecified 11/12/2020   Pneumococcal Conjugate-13 03/22/2015   Pneumococcal Polysaccharide-23 01/04/2012, 05/21/2016   Pneumococcal-Unspecified 11/12/2016   Zoster, Live 09/25/2019    Family History  Problem Relation Age of Onset   Heart disease Father    Heart disease Sister        cardiac arrest  Lymphoma Brother    Colon cancer Paternal Grandmother    Esophageal cancer Neg Hx    Rectal cancer Neg Hx    Stomach cancer Neg Hx      Current Outpatient Medications:    acetaminophen  (TYLENOL ) 500 MG tablet, Take 1,000 mg by mouth every 6 (six) hours as needed  for moderate pain or headache. , Disp: , Rfl:    albuterol  (VENTOLIN  HFA) 108 (90 Base) MCG/ACT inhaler, Inhale 2 puffs into the lungs every 6 (six) hours as needed for wheezing or shortness of breath., Disp: 18 g, Rfl: 3   calcium carbonate (OSCAL) 1500 (600 Ca) MG TABS tablet, Take 600 mg of elemental calcium by mouth daily with breakfast., Disp: , Rfl:    carvedilol  (COREG ) 6.25 MG tablet, Take 1 tablet (6.25 mg total) by mouth 2 (two) times daily with a meal., Disp: 60 tablet, Rfl: 2   Cholecalciferol  (VITAMIN D ) 1000 UNITS capsule, Take 1,000 Units by mouth every morning., Disp: , Rfl:    Coenzyme Q10 (COQ10) 100 MG CAPS, Take 100 mg by mouth daily., Disp: , Rfl:    fenofibrate 160 MG tablet, Take 160 mg by mouth daily., Disp: , Rfl:    Fluticasone -Umeclidin-Vilant (TRELEGY ELLIPTA ) 100-62.5-25 MCG/ACT AEPB, Inhale 1 puff into the lungs daily. TAKE 1 PUFF BY MOUTH EVERY DAY, Disp: 60 each, Rfl: 5   losartan (COZAAR) 50 MG tablet, Take 50 mg by mouth daily., Disp: , Rfl:    methocarbamol (ROBAXIN) 750 MG tablet, 1 tablet Orally 3 times a day; Duration: 30 days, Disp: , Rfl:    omeprazole (PRILOSEC) 40 MG capsule, Take 40 mg by mouth every morning., Disp: , Rfl:    Pitavastatin Magnesium (ZYPITAMAG) 4 MG TABS, Take 4 mg by mouth daily., Disp: , Rfl:    valACYclovir (VALTREX) 1000 MG tablet, Take 1,000 mg by mouth 2 (two) times daily as needed., Disp: , Rfl:    venlafaxine  XR (EFFEXOR -XR) 75 MG 24 hr capsule, Take 75 mg by mouth daily., Disp: , Rfl:       Objective:   Vitals:   11/18/23 1108  BP: 116/64  Pulse: 77  Temp: 98.5 F (36.9 C)  TempSrc: Oral  SpO2: 96%  Weight: 127 lb 9.6 oz (57.9 kg)  Height: 4' 11.5 (1.511 m)    Estimated body mass index is 25.34 kg/m as calculated from the following:   Height as of this encounter: 4' 11.5 (1.511 m).   Weight as of this encounter: 127 lb 9.6 oz (57.9 kg).  @WEIGHTCHANGE @  [employer]   11/18/23 1108  Weight: 127 lb 9.6 oz  (57.9 kg)     Physical Exam   General: No distress. Looks well O2 at rest: no Cane present: no Sitting in wheel chair: no Frail: no Obese: no Neuro: Alert and Oriented x 3. GCS 15. Speech normal Psych: Pleasant Resp:  Barrel Chest - no.  Wheeze - no, Crackles - no, No overt respiratory distress CVS: Normal heart sounds. Murmurs - no Ext: Stigmata of Connective Tissue Disease - no HEENT: Normal upper airway. PEERL +. No post nasal drip        Assessment/     Assessment & Plan Chronic obstructive pulmonary disease, unspecified COPD type (HCC)  Encounter for screening for lung cancer    PLAN Patient Instructions     ICD-10-CM   1. Stage 2 moderate COPD by GOLD classification (HCC)  J44.9     2. Screening for malignant neoplasm of respiratory  organ  Z12.2        Stage 2 moderate COPD by GOLD classification (HCC) COPD, frequent exacerbations (HCC)  -Glad doing well on Trelegy although I did note that in December 2024 you are in the ER for COPD exacerbation. Since then no flare ups and doing really well  Plan  - continue trelegy scheduled - do albuteroal as needed -If symptoms of COPD flareup get worse then please call us  for antibiotic and prednisone  - Avoid respiratory illness sick exposure and control your risk for respiratory infection  - be uptodate with all respiratory vaccines  - avoid sick contacts especially in areas of indoor clusters (churches, weddings, funerals, family gatherings, birthdays, planes, malls, indoor areas especially)   - mask in these areas   - discourage sick people from coming in close contact with you     Screening for malignant neoplasm of respiratory organ -negative CT scan August 2024 and Sept 2025  Plan  - Lung screening program should be arranging a CT scan for you in Sept 2026  Folowup  Sept 2026 to see APP or DR Verlena but after LDCT    FOLLOWUP    Return for  Sept 2026 to see APP or DR RAmaswamt but after  LDCT.    SIGNATURE    Dr. Dorethia Cave, M.D., F.C.C.P,  Pulmonary and Critical Care Medicine Staff Physician, [facility] Center Director - Interstitial Lung Disease  Program  Pulmonary Fibrosis [facility] at [facility] [place], [place], [address]  Pager: [phone], If no answer or between  15:00h - 7:00h: call [phone] Telephone: [phone]  11:42 AM 11/18/2023   Moderate Complexity MDM OFFICE  2021 E/M guidelines, first released in 2021, with minor revisions added in 2023 and 2024 Must meet the requirements for 2 out of 3 dimensions to qualify.    Number and complexity of problems addressed Amount and/or complexity of data reviewed Risk of complications and/or morbidity  One or more chronic illness with mild exacerbation, OR progression, OR  side effects of treatment  Two or more stable chronic illnesses  One undiagnosed new problem with uncertain prognosis  One acute illness with systemic symptoms   One Acute complicated injury Must meet the requirements for 1 of 3 of the categories)  Category 1: Tests and documents, historian  Any combination of 3 of the following:  Assessment requiring an independent historian  Review of prior external note(s) from each unique source  Review of results of each unique test  Ordering of each unique test    Category 2: Interpretation of tests   Independent interpretation of a test performed by another physician/other qualified health care professional (not separately reported)  Category 3: Discuss management/tests  Discussion of management or test interpretation with external physician/other qualified health care professional/appropriate source (not separately reported) Moderate risk of morbidity from additional diagnostic testing or treatment Examples only:  Prescription drug management  Decision regarding minor surgery with identfied patient or procedure risk  factors  Decision regarding elective major surgery without identified patient or procedure risk factors  Diagnosis or treatment significantly limited by social determinants of health             HIGh Complexity  OFFICE   2021 E/M guidelines, first released in 2021, with minor revisions added in 2023. Must meet the requirements for 2 out of 3 dimensions to qualify.    Number and complexity of problems addressed Amount and/or  complexity of data reviewed Risk of complications and/or morbidity  Severe exacerbation of chronic illness  Acute or chronic illnesses that may pose a threat to life or bodily function, e.g., multiple trauma, acute MI, pulmonary embolus, severe respiratory distress, progressive rheumatoid arthritis, psychiatric illness with potential threat to self or others, peritonitis, acute renal failure, abrupt change in neurological status Must meet the requirements for 2 of 3 of the categories)  Category 1: Tests and documents, historian  Any combination of 3 of the following:  Assessment requiring an independent historian  Review of prior external note(s) from each unique source  Review of results of each unique test  Ordering of each unique test    Category 2: Interpretation of tests    Independent interpretation of a test performed by another physician/other qualified health care professional (not separately reported)  Category 3: Discuss management/tests  Discussion of management or test interpretation with external physician/other qualified health care professional/appropriate source (not separately reported)  HIGH risk of morbidity from additional diagnostic testing or treatment Examples only:  Drug therapy requiring intensive monitoring for toxicity  Decision for elective major surgery with identified pateint or procedure risk factors  Decision regarding hospitalization or escalation of level of care  Decision for DNR or to de-escalate  care   Parenteral controlled  substances            LEGEND - Independent interpretation involves the interpretation of a test for which there is a CPT code, and an interpretation or report is customary. When a review and interpretation of a test is performed and documented by the provider, but not separately reported (billed), then this would represent an independent interpretation. This report does not need to conform to the usual standards of a complete report of the test. This does not include interpretation of tests that do not have formal reports such as a complete blood count with differential and blood cultures. Examples would include reviewing a chest radiograph and documenting in the medical record an interpretation, but not separately reporting (billing) the interpretation of the chest radiograph.   An appropriate source includes professionals who are not health care professionals but may be involved in the management of the patient, such as a clinical research associate, upper officer, case manager or teacher, and does not include discussion with family or informal caregivers.    - SDOH: SDOH are the conditions in the environments where people are born, live, learn, work, play, worship, and age that affect a wide range of health, functioning, and quality-of-life outcomes and risks. (e.g., housing, food insecurity, transportation, etc.). SDOH-related Z codes ranging from Z55-Z65 are the ICD-10-CM diagnosis codes used to document SDOH data Z55 - Problems related to education and literacy Z56 - Problems related to employment and unemployment Z57 - Occupational exposure to risk factors Z58 - Problems related to physical environment Z59 - Problems related to housing and economic circumstances [phone] - Problems related to social environment [phone] - Problems related to upbringing [phone] - Other problems related to primary support group, including family circumstances Z19 - Problems related to certain  psychosocial circumstances Z65 - Problems related to other psychosocial circumstances

## 2023-11-26 DIAGNOSIS — M542 Cervicalgia: Secondary | ICD-10-CM | POA: Diagnosis not present

## 2023-12-02 ENCOUNTER — Ambulatory Visit
Admission: RE | Admit: 2023-12-02 | Discharge: 2023-12-02 | Disposition: A | Source: Ambulatory Visit | Attending: Family Medicine | Admitting: Family Medicine

## 2023-12-02 DIAGNOSIS — Z1231 Encounter for screening mammogram for malignant neoplasm of breast: Secondary | ICD-10-CM | POA: Diagnosis not present

## 2023-12-03 DIAGNOSIS — M542 Cervicalgia: Secondary | ICD-10-CM | POA: Diagnosis not present

## 2023-12-12 DIAGNOSIS — E890 Postprocedural hypothyroidism: Secondary | ICD-10-CM | POA: Diagnosis not present

## 2023-12-12 DIAGNOSIS — J439 Emphysema, unspecified: Secondary | ICD-10-CM | POA: Diagnosis not present

## 2023-12-12 DIAGNOSIS — E782 Mixed hyperlipidemia: Secondary | ICD-10-CM | POA: Diagnosis not present

## 2023-12-12 DIAGNOSIS — I129 Hypertensive chronic kidney disease with stage 1 through stage 4 chronic kidney disease, or unspecified chronic kidney disease: Secondary | ICD-10-CM | POA: Diagnosis not present

## 2023-12-12 DIAGNOSIS — N183 Chronic kidney disease, stage 3 unspecified: Secondary | ICD-10-CM | POA: Diagnosis not present

## 2023-12-12 DIAGNOSIS — J449 Chronic obstructive pulmonary disease, unspecified: Secondary | ICD-10-CM | POA: Diagnosis not present

## 2023-12-12 DIAGNOSIS — F3342 Major depressive disorder, recurrent, in full remission: Secondary | ICD-10-CM | POA: Diagnosis not present

## 2023-12-12 DIAGNOSIS — M810 Age-related osteoporosis without current pathological fracture: Secondary | ICD-10-CM | POA: Diagnosis not present

## 2023-12-19 ENCOUNTER — Other Ambulatory Visit: Payer: Self-pay | Admitting: Internal Medicine

## 2023-12-19 DIAGNOSIS — J449 Chronic obstructive pulmonary disease, unspecified: Secondary | ICD-10-CM

## 2023-12-22 ENCOUNTER — Other Ambulatory Visit (HOSPITAL_COMMUNITY): Payer: Self-pay | Admitting: Orthopedic Surgery

## 2023-12-22 DIAGNOSIS — Z96659 Presence of unspecified artificial knee joint: Secondary | ICD-10-CM

## 2023-12-31 ENCOUNTER — Ambulatory Visit (HOSPITAL_COMMUNITY)
Admission: RE | Admit: 2023-12-31 | Discharge: 2023-12-31 | Disposition: A | Discharge status: Home / Self Care | Source: Ambulatory Visit | Attending: Orthopedic Surgery | Admitting: Orthopedic Surgery

## 2023-12-31 ENCOUNTER — Encounter (HOSPITAL_COMMUNITY): Payer: Self-pay

## 2023-12-31 DIAGNOSIS — Z96659 Presence of unspecified artificial knee joint: Secondary | ICD-10-CM | POA: Insufficient documentation

## 2023-12-31 MED ORDER — TECHNETIUM TC 99M MEDRONATE IV KIT
20.0000 | PACK | Freq: Once | INTRAVENOUS | Status: AC
  Administered 2023-12-31: 20.4 via INTRAVENOUS

## 2024-01-20 ENCOUNTER — Telehealth: Payer: Self-pay

## 2024-01-20 NOTE — Telephone Encounter (Signed)
 Copied from CRM #[id number]. Topic: Clinical - Medication Question >> Jan 20, 2024  1:31 PM Joesph PARAS wrote: Reason for CRM: Patient is requesting two samples of Trelegy be left for her at the front desk, as she does not wish to pay the high cost of it without having met her deductible.  I called an spoke to pt. I informed pt that unfortunately, we do not have any Trelegy samples however, we were expecting some to come in. I informed pt that I personally do not know when we will get another shipment of samples in but to try calling next week to check again. Pt verbalized understanding. NFN

## 2024-01-27 NOTE — Telephone Encounter (Signed)
 Copied from CRM #[id number]. Topic: Clinical - Medication Question >> Jan 25, 2024  8:30 AM Isabell A wrote: Reason for CRM: Patient is requesting to speak with Ashlyn in regards to if theres any Trelegy samples available this week.   Callback number: [phone] >> Jan 26, 2024  9:37 AM Isabell A wrote: Patient is calling back for any updates.   ----------------------------------------------------------------------------------------------------------------------------------------------  I called and spoke with patient, advised her that we are still waiting for our Trelegy samples to come in.  She asked if I could call her when they come in.  I made a note to call her when they come in.

## 2024-02-03 MED ORDER — TRELEGY ELLIPTA 100-62.5-25 MCG/ACT IN AEPB: 1.0000 | INHALATION_SPRAY | Freq: Every day | RESPIRATORY_TRACT | Status: AC

## 2024-02-03 NOTE — Addendum Note (Signed)
 Addended by: MELVENIA WILFORD SAUNDERS on: 02/03/2024 01:22 PM   Modules accepted: Orders

## 2024-02-03 NOTE — Telephone Encounter (Signed)
 Called patient advised we do have a sample,left one sample at the front for her to pick up this afternoon.NFN

## 2024-02-04 NOTE — Addendum Note (Signed)
 Encounter addended by: Crawford Rea POUR on: 02/04/2024 2:55 PM  Actions taken: Imaging Exam ended

## 2024-02-08 NOTE — Addendum Note (Signed)
 Encounter addended by: Radford Drivers, RT on: 02/08/2024 8:56 AM  Actions taken: Imaging Exam ended

## 2024-02-08 NOTE — Addendum Note (Signed)
 Encounter addended by: Radford Drivers, RT on: 02/08/2024 9:13 AM  Actions taken: Imaging Exam ended

## 2024-02-11 ENCOUNTER — Telehealth: Payer: Self-pay

## 2024-02-11 NOTE — Telephone Encounter (Signed)
 Copied from CRM #[id number]. Topic: Clinical - Prescription Issue >> Feb 07, 2024 10:03 AM Rilla B wrote: Reason for CRM: Patient states she received a sample of Trelegy and she needs another sample as she needed a month supply and that one was just 14 day. Patient would like to pick up another sample.  Please call patient @ [phone]. >> Feb 09, 2024  2:27 PM Dedra B wrote: Patient calling to follow up on request for Trilegy sample. >> Feb 08, 2024 10:26 AM Dedra B wrote: Patient calling to follow up request for another sample of Trelegy. Please notify patient when it's ready to be picked up.   Spoke with patient VBU, we only had one sample left will be up will be up front for pick up
